# Patient Record
Sex: Female | Born: 1945 | Race: Black or African American | Hispanic: No | Marital: Single | Smoking: Former smoker
Health system: Southern US, Community
[De-identification: ages and names within clinical notes are randomized; demographics above are authoritative.]

## PROBLEM LIST (undated history)

## (undated) DIAGNOSIS — K219 Gastro-esophageal reflux disease without esophagitis: Secondary | ICD-10-CM

## (undated) DIAGNOSIS — R0789 Other chest pain: Secondary | ICD-10-CM

## (undated) DIAGNOSIS — I2510 Atherosclerotic heart disease of native coronary artery without angina pectoris: Secondary | ICD-10-CM

## (undated) DIAGNOSIS — T7840XA Allergy, unspecified, initial encounter: Secondary | ICD-10-CM

## (undated) DIAGNOSIS — R739 Hyperglycemia, unspecified: Secondary | ICD-10-CM

## (undated) DIAGNOSIS — M1990 Unspecified osteoarthritis, unspecified site: Secondary | ICD-10-CM

## (undated) DIAGNOSIS — F419 Anxiety disorder, unspecified: Secondary | ICD-10-CM

## (undated) DIAGNOSIS — I5189 Other ill-defined heart diseases: Secondary | ICD-10-CM

## (undated) DIAGNOSIS — J449 Chronic obstructive pulmonary disease, unspecified: Secondary | ICD-10-CM

## (undated) DIAGNOSIS — E785 Hyperlipidemia, unspecified: Secondary | ICD-10-CM

## (undated) DIAGNOSIS — I10 Essential (primary) hypertension: Secondary | ICD-10-CM

## (undated) DIAGNOSIS — H409 Unspecified glaucoma: Secondary | ICD-10-CM

## (undated) DIAGNOSIS — H269 Unspecified cataract: Secondary | ICD-10-CM

## (undated) DIAGNOSIS — J45909 Unspecified asthma, uncomplicated: Secondary | ICD-10-CM

## (undated) HISTORY — DX: Chronic obstructive pulmonary disease, unspecified: J44.9

## (undated) HISTORY — DX: Other ill-defined heart diseases: I51.89

## (undated) HISTORY — DX: Hyperlipidemia, unspecified: E78.5

## (undated) HISTORY — DX: Allergy, unspecified, initial encounter: T78.40XA

## (undated) HISTORY — DX: Hyperglycemia, unspecified: R73.9

## (undated) HISTORY — DX: Gastro-esophageal reflux disease without esophagitis: K21.9

## (undated) HISTORY — DX: Other chest pain: R07.89

## (undated) HISTORY — DX: Atherosclerotic heart disease of native coronary artery without angina pectoris: I25.10

## (undated) HISTORY — DX: Unspecified asthma, uncomplicated: J45.909

## (undated) HISTORY — DX: Unspecified glaucoma: H40.9

## (undated) HISTORY — PX: TONSILLECTOMY: SUR1361

## (undated) HISTORY — DX: Essential (primary) hypertension: I10

## (undated) HISTORY — DX: Unspecified cataract: H26.9

## (undated) HISTORY — DX: Unspecified osteoarthritis, unspecified site: M19.90

## (undated) HISTORY — DX: Anxiety disorder, unspecified: F41.9

---

## 2005-03-29 ENCOUNTER — Emergency Department: Payer: Self-pay | Admitting: Emergency Medicine

## 2006-07-27 ENCOUNTER — Emergency Department (HOSPITAL_COMMUNITY): Admission: EM | Admit: 2006-07-27 | Discharge: 2006-07-27 | Payer: Self-pay | Admitting: Emergency Medicine

## 2006-11-13 ENCOUNTER — Emergency Department: Payer: Self-pay | Admitting: Internal Medicine

## 2007-02-25 ENCOUNTER — Inpatient Hospital Stay (HOSPITAL_BASED_OUTPATIENT_CLINIC_OR_DEPARTMENT_OTHER): Admission: RE | Admit: 2007-02-25 | Discharge: 2007-02-25 | Payer: Self-pay | Admitting: Cardiology

## 2007-07-30 ENCOUNTER — Emergency Department (HOSPITAL_COMMUNITY): Admission: EM | Admit: 2007-07-30 | Discharge: 2007-07-30 | Payer: Self-pay | Admitting: Emergency Medicine

## 2008-04-23 ENCOUNTER — Emergency Department (HOSPITAL_COMMUNITY): Admission: EM | Admit: 2008-04-23 | Discharge: 2008-04-24 | Payer: Self-pay | Admitting: Emergency Medicine

## 2008-06-09 ENCOUNTER — Other Ambulatory Visit: Payer: Self-pay

## 2008-06-09 ENCOUNTER — Inpatient Hospital Stay: Payer: Self-pay | Admitting: Internal Medicine

## 2009-05-30 ENCOUNTER — Emergency Department: Payer: Self-pay | Admitting: Emergency Medicine

## 2009-07-15 ENCOUNTER — Ambulatory Visit: Payer: Self-pay | Admitting: Cardiology

## 2009-07-15 ENCOUNTER — Telehealth: Payer: Self-pay | Admitting: Cardiology

## 2009-07-15 DIAGNOSIS — R0602 Shortness of breath: Secondary | ICD-10-CM | POA: Insufficient documentation

## 2009-07-15 DIAGNOSIS — I10 Essential (primary) hypertension: Secondary | ICD-10-CM

## 2009-07-15 DIAGNOSIS — Z87891 Personal history of nicotine dependence: Secondary | ICD-10-CM | POA: Insufficient documentation

## 2009-07-15 DIAGNOSIS — E785 Hyperlipidemia, unspecified: Secondary | ICD-10-CM | POA: Insufficient documentation

## 2009-07-18 ENCOUNTER — Encounter: Payer: Self-pay | Admitting: Cardiology

## 2009-07-18 LAB — CONVERTED CEMR LAB
ALT: 24 units/L (ref 0–35)
BUN: 8 mg/dL (ref 6–23)
CO2: 26 meq/L (ref 19–32)
Cholesterol: 231 mg/dL — ABNORMAL HIGH (ref 0–200)
Glucose, Bld: 87 mg/dL (ref 70–99)
HDL: 39 mg/dL — ABNORMAL LOW (ref >39)
Sodium: 142 meq/L (ref 135–145)
Total CHOL/HDL Ratio: 5.9
Triglycerides: 175 mg/dL — ABNORMAL HIGH (ref <150)
VLDL: 35 mg/dL (ref 0–40)

## 2009-07-25 ENCOUNTER — Ambulatory Visit: Payer: Self-pay | Admitting: Internal Medicine

## 2009-07-25 ENCOUNTER — Encounter: Payer: Self-pay | Admitting: Cardiology

## 2009-07-26 LAB — CONVERTED CEMR LAB
BUN: 23 mg/dL (ref 6–23)
Calcium: 9.2 mg/dL (ref 8.4–10.5)
Chloride: 100 meq/L (ref 96–112)
Potassium: 3.9 meq/L (ref 3.5–5.3)
Sodium: 141 meq/L (ref 135–145)

## 2009-07-28 ENCOUNTER — Telehealth (INDEPENDENT_AMBULATORY_CARE_PROVIDER_SITE_OTHER): Payer: Self-pay | Admitting: *Deleted

## 2009-08-01 ENCOUNTER — Ambulatory Visit: Payer: Self-pay

## 2009-08-01 ENCOUNTER — Ambulatory Visit: Payer: Self-pay | Admitting: Internal Medicine

## 2009-08-01 ENCOUNTER — Encounter: Payer: Self-pay | Admitting: Cardiology

## 2009-08-01 ENCOUNTER — Ambulatory Visit (HOSPITAL_COMMUNITY): Admission: RE | Admit: 2009-08-01 | Discharge: 2009-08-01 | Payer: Self-pay | Admitting: Cardiology

## 2009-08-01 ENCOUNTER — Encounter (HOSPITAL_COMMUNITY): Admission: RE | Admit: 2009-08-01 | Discharge: 2009-09-14 | Payer: Self-pay | Admitting: Cardiology

## 2009-08-04 ENCOUNTER — Ambulatory Visit: Payer: Self-pay | Admitting: Cardiology

## 2009-08-23 ENCOUNTER — Telehealth: Payer: Self-pay | Admitting: Cardiology

## 2009-08-26 ENCOUNTER — Encounter: Payer: Self-pay | Admitting: Cardiology

## 2009-09-23 ENCOUNTER — Encounter: Payer: Self-pay | Admitting: Cardiology

## 2009-10-07 ENCOUNTER — Encounter: Payer: Self-pay | Admitting: Cardiology

## 2009-10-07 ENCOUNTER — Ambulatory Visit: Payer: Self-pay | Admitting: Cardiovascular Disease

## 2009-10-11 ENCOUNTER — Telehealth: Payer: Self-pay | Admitting: Cardiology

## 2009-10-11 LAB — CONVERTED CEMR LAB
ALT: 26 units/L (ref 0–35)
AST: 16 units/L (ref 0–37)
Alkaline Phosphatase: 84 units/L (ref 39–117)
Bilirubin, Direct: 0.1 mg/dL (ref 0.0–0.3)
Cholesterol: 135 mg/dL (ref 0–200)
Total Bilirubin: 0.4 mg/dL (ref 0.3–1.2)

## 2009-11-11 ENCOUNTER — Ambulatory Visit: Payer: Self-pay | Admitting: Cardiovascular Disease

## 2009-11-15 ENCOUNTER — Encounter: Payer: Self-pay | Admitting: Cardiovascular Disease

## 2009-11-15 ENCOUNTER — Ambulatory Visit: Payer: Self-pay | Admitting: Cardiovascular Disease

## 2010-01-31 ENCOUNTER — Ambulatory Visit: Payer: Self-pay | Admitting: Cardiovascular Disease

## 2010-03-22 ENCOUNTER — Encounter: Payer: Self-pay | Admitting: Cardiovascular Disease

## 2010-03-22 ENCOUNTER — Telehealth: Payer: Self-pay | Admitting: Cardiovascular Disease

## 2010-04-05 ENCOUNTER — Ambulatory Visit: Payer: Self-pay | Admitting: Cardiovascular Disease

## 2010-04-12 ENCOUNTER — Encounter: Payer: Self-pay | Admitting: Cardiovascular Disease

## 2010-04-13 ENCOUNTER — Telehealth: Payer: Self-pay | Admitting: Cardiovascular Disease

## 2010-04-13 ENCOUNTER — Encounter: Payer: Self-pay | Admitting: Cardiovascular Disease

## 2010-04-22 ENCOUNTER — Emergency Department: Payer: Self-pay | Admitting: Internal Medicine

## 2010-07-31 ENCOUNTER — Telehealth: Payer: Self-pay | Admitting: Cardiology

## 2010-08-19 ENCOUNTER — Emergency Department (HOSPITAL_COMMUNITY)
Admission: EM | Admit: 2010-08-19 | Discharge: 2010-08-19 | Payer: Self-pay | Source: Home / Self Care | Admitting: Emergency Medicine

## 2010-10-17 NOTE — Progress Notes (Signed)
Summary: RX  Phone Note Refill Request Call back at Home Phone [phone] Message from:  Patient on July 31, 2010 10:41 AM  Refills Requested: Medication #1:  NEXIUM 40 MG CPDR 1 by mouth once daily [facility]  Initial call taken by: Harlon Flor,  July 31, 2010 10:41 AM Caller: Patient    Prescriptions: NEXIUM 40 MG CPDR (ESOMEPRAZOLE MAGNESIUM) 1 by mouth once daily  #90 x 2   Entered by:   Hardin Negus, RMA   Authorized by:   Dossie Arbour MD   Signed by:   Hardin Negus, RMA on 07/31/2010   Method used:   Electronically to        [facility]* (mail-order)       [address]       [place], [place]  [address]       Ph: [phone]       Fax: [phone]   RxID:   [id number]   Appended Document: RX    Prescriptions: NEXIUM 40 MG CPDR (ESOMEPRAZOLE MAGNESIUM) 1 by mouth once daily  #90 x 2   Entered by:   Bishop Dublin, CMA   Authorized by:   Dossie Arbour MD   Signed by:   Bishop Dublin, CMA on 08/03/2010   Method used:   Electronically to        [facility]* (mail-order)       [address]       [place], [place]  [address]       Ph: [phone]       Fax: [phone]   RxID:   [id number]

## 2010-10-17 NOTE — Progress Notes (Signed)
Summary: Refill Zocor   Phone Note Refill Request   Refills Requested: Medication #1:  ZOCOR 40 MG TABS 1 tab by mouth daily   Dosage confirmed as above?Dosage Confirmed   Supply Requested: 3 months [facility]* (mail-order)       [address]       [place], [place]  [address]       Ph: [phone]       Fax: [phone]  Initial call taken by: West Carbo,  March 22, 2010 9:22 AM    Prescriptions: ZOCOR 40 MG TABS (SIMVASTATIN) 1 tab by mouth daily  #90 x 3   Entered by:   Bishop Dublin, CMA   Authorized by:   Dossie Arbour MD   Signed by:   Bishop Dublin, CMA on 03/22/2010   Method used:   Electronically to        [facility]* (mail-order)       [address]       [place], [place]  [address]       Ph: [phone]       Fax: [phone]   RxID:   [id number]

## 2010-10-17 NOTE — Medication Information (Signed)
Summary: Tax adviser   Imported By: West Carbo 04/13/2010 15:25:42  _____________________________________________________________________  External Attachment:    Type:   Image     Comment:   External Document

## 2010-10-17 NOTE — Progress Notes (Signed)
Summary: RX  Phone Note Call from Patient Call back at Home Phone [phone]   Caller: SELF Call For: [facility] Summary of Call: NEED [facility] [address]  Initial call taken by: Harlon Flor,  October 11, 2009 10:12 AM

## 2010-10-17 NOTE — Medication Information (Signed)
Summary: Tax adviser   Imported By: Frazier Butt Chriscoe 04/12/2010 10:29:26  _____________________________________________________________________  External Attachment:    Type:   Image     Comment:   External Document

## 2010-10-17 NOTE — Progress Notes (Signed)
Summary: REFILL  Phone Note Call from Patient Call back at Home Phone [phone]   Caller: Patient Call For: [facility] Summary of Call: NEEDS TO CLARIFY WITH [facility] FOR ADVAIR. PATIENT SAYS THEY DENIED PRESCRIPTION Initial call taken by: West Carbo,  April 13, 2010 10:21 AM  Additional Follow-up for Phone Call Additional follow up Details #1::        The Rx was faxed again with correct information per [facility]. Additional Follow-up by: Bishop Dublin, CMA,  April 13, 2010 12:20 PM

## 2010-10-17 NOTE — Medication Information (Signed)
Summary: Tax adviser   Imported By: Harlon Flor 09/23/2009 10:29:00  _____________________________________________________________________  External Attachment:    Type:   Image     Comment:   External Document

## 2010-10-17 NOTE — Assessment & Plan Note (Signed)
Summary: ekg  Nurse Visit   Allergies: 1)  ! * Pcn  Orders Added: 1)  EKG w/ Interpretation [93000]

## 2010-10-17 NOTE — Medication Information (Signed)
Summary: Tax adviser   Imported By: West Carbo 03/22/2010 10:11:24  _____________________________________________________________________  External Attachment:    Type:   Image     Comment:   External Document

## 2010-10-17 NOTE — Assessment & Plan Note (Signed)
Summary: ROV   Visit Type:  Follow-up Referring Provider:  mclean Primary Provider:  None  CC:  c/o shortness of breath and abdominal tightness.Marland Kitchen  History of Present Illness: 65 yo with history of HTN, hyperlipidemia, and prior cath with only mild coronary disease in 2008 presents for follow up.   Pulmonary function test showed moderate COPD with bronchodilator response. She continues to have some shortness of breath and wheezing. She takes Spiriva and albuterol as needed. she stopped smoking many years ago.  otherwise she feels well. Is active, no significant chest pain.  patient had an ETT-myoview which showed poor exercise tolerance but no evidence for ischemia or infarction.  Echo was done showing normal systolic function and normal valves.   Labs (10/10): LDL 157, HDL 39, TG 175, K 3.9, creatinine 1.06  Current Medications (verified): 1)  Nexium 40 Mg Cpdr (Esomeprazole Magnesium) .Marland Kitchen.. 1 By Mouth Once Daily 2)  Aspirin 81 Mg Tbec (Aspirin) .... Take One Tablet By Mouth Daily 3)  Lisinopril-Hydrochlorothiazide 20-12.5 Mg Tabs (Lisinopril-Hydrochlorothiazide) .Marland Kitchen.. 1 Tablet By Mouth Daily 4)  Zocor 40 Mg Tabs (Simvastatin) .Marland Kitchen.. 1 Tab By Mouth Daily 5)  Fish Oil 1000 Mg Caps (Omega-3 Fatty Acids) .... 2 Tab By Mouth Daily 6)  Proair Hfa 108 (90 Base) Mcg/act Aers (Albuterol Sulfate) .Marland Kitchen.. 1-2 Puffs Every 4-6 Hours As Needed 7)  Spiriva Handihaler 18 Mcg Caps (Tiotropium Bromide Monohydrate) .... As Directed  Allergies (verified): 1)  ! * Pcn  Past History:  Past Medical History: Last updated: 08/04/2009 1. Anxiety 2. Asthma 3. G E R D 4. Hyperlipidemia 5. Hypertension 6. Chest pain: LHC (6/08): EF 55-60%, 20-30% proximal LAD, LIs CFX, LIs RCA (Harwani). ETT-myoview (11/10): 3', stopped due to fatigue and chest tightness, EF normal, probably normal perfusion images with minimal soft tissue attenuation.  7. Prior smoker 8. Dyspnea on exertion: Echo (11/10) showed EF 60-65%,  valves normal.   Past Surgical History: Last updated: 2009-07-27 Tonsillectomy  Family History: Last updated: 07-27-2009 Mother: deceased 72: cancer of uterus Father: deceased 35: MI Brother: deceased 24: pulmonary embolus  Social History: Last updated: 07-27-2009 Retired  Divorced, lives alone, no children.   Tobacco Use - Former, quit 1980s.  Alcohol Use - no Regular Exercise - no Drug Use - no  Risk Factors: Alcohol Use: 0 (July 27, 2009) Exercise: no (Jul 27, 2009)  Risk Factors: Smoking Status: quit (July 27, 2009) Packs/Day: .25 daily (July 27, 2009)  Review of Systems       The patient complains of dyspnea on exertion.  The patient denies fever, weight loss, weight gain, vision loss, decreased hearing, hoarseness, chest pain, syncope, peripheral edema, prolonged cough, abdominal pain, incontinence, muscle weakness, depression, and enlarged lymph nodes.    Vital Signs:  Patient profile:   65 year old female Height:      65 inches Weight:      222 pounds Pulse rate:   82 / minute BP sitting:   116 / 76  (left arm) Cuff size:   large  Vitals Entered By: Bishop Dublin, CMA (April 05, 2010 10:29 AM)  Physical Exam  General:  Well developed, well nourished, in no acute distress. Head:  normocephalic and atraumatic Neck:  Neck supple, no JVD. No masses, thyromegaly or abnormal cervical nodes. Lungs:  Clear bilaterally to auscultation and percussion. Heart:  Non-displaced PMI, chest non-tender; regular rate and rhythm, S1, S2 without murmurs, rubs or gallops. Carotid upstroke normal, no bruit. Pedals normal pulses. No edema, no varicosities. Abdomen:  Bowel sounds positive; abdomen  soft and non-tender without masses Msk:  Back normal, normal gait. Muscle strength and tone normal. Pulses:  pulses normal in all 4 extremities Extremities:  No clubbing or cyanosis. Neurologic:  Alert and oriented x 3. Skin:  Intact without lesions or rashes. Psych:  Normal  affect.   Impression & Recommendations:  Problem # 1:  SHORTNESS OF BREATH (ICD-786.05) she has underlying COPD based on pulmonary function test with bronchodilator response. She's only taking Spiriva on a consistent basis. We will start her on Advair 2 puffs b.i.d. and albuterol p.r.n. She can follow up with her primary care physician for adjustment of the inhalers.  Her updated medication list for this problem includes:    Aspirin 81 Mg Tbec (Aspirin) .Marland Kitchen... Take one tablet by mouth daily    Lisinopril-hydrochlorothiazide 20-12.5 Mg Tabs (Lisinopril-hydrochlorothiazide) .Marland Kitchen... 1 tablet by mouth daily  Problem # 2:  HYPERTENSION, UNSPECIFIED (ICD-401.9) Blood pressure is reasonably well controlled. We have made no further medication changes.  Her updated medication list for this problem includes:    Aspirin 81 Mg Tbec (Aspirin) .Marland Kitchen... Take one tablet by mouth daily    Lisinopril-hydrochlorothiazide 20-12.5 Mg Tabs (Lisinopril-hydrochlorothiazide) .Marland Kitchen... 1 tablet by mouth daily  Problem # 3:  HYPERLIPIDEMIA-MIXED (ICD-272.4) She has excellent lipid control on Zocor and we've made no changes.  Her updated medication list for this problem includes:    Zocor 40 Mg Tabs (Simvastatin) .Marland Kitchen... 1 tab by mouth daily  Patient Instructions: 1)  Your physician has recommended you make the following change in your medication: START advair 2)  Your physician wants you to follow-up in:   1 year You will receive a reminder letter in the mail two months in advance. If you don't receive a letter, please call our office to schedule the follow-up appointment. Prescriptions: ADVAIR DISKUS 250-50 MCG/DOSE AEPB (FLUTICASONE-SALMETEROL) 2 puffs two times a day  #3 x 4   Entered by:   Benedict Needy, RN   Authorized by:   Dossie Arbour MD   Signed by:   Benedict Needy, RN on 04/05/2010   Method used:   Electronically to        [facility] ([facility]order)       [address]        [place], [place]  [address]       Ph: [phone]       Fax: [phone]   RxID:   [id number]

## 2010-10-17 NOTE — Assessment & Plan Note (Signed)
Summary: EC6/AMD   Visit Type:  Follow-up Referring Provider:  mclean  CC:  some chest tightness.  History of Present Illness: 65 yo with history of HTN, hyperlipidemia, and prior cath with only mild coronary disease in 2008 presents for follow up of chest pain and shortness of breath.   last seen November 2010 by Dr. Shirlee Latch for shortness of breath and chest discomfort. She continues to have symptoms of these are mild and typically not associated with exertion. She has continued problems with her breathing and takes inhalers.  patient had an ETT-myoview which showed poor exercise tolerance but no evidence for ischemia or infarction.  Echo was done showing normal systolic function and normal valves.   Labs (10/10): LDL 157, HDL 39, TG 175, K 3.9, creatinine 1.06  Current Problems (verified): 1)  Hypertension, Unspecified  (ICD-401.9) 2)  Hyperlipidemia-mixed  (ICD-272.4) 3)  Chest Pain  (ICD-786.50) 4)  Shortness of Breath  (ICD-786.05) 5)  Tobacco Abuse, Hx of  (ICD-V15.82)  Current Medications (verified): 1)  Nexium 40 Mg Cpdr (Esomeprazole Magnesium) .Marland Kitchen.. 1 By Mouth Once Daily 2)  Aspirin 81 Mg Tbec (Aspirin) .... Take One Tablet By Mouth Daily 3)  Lisinopril-Hydrochlorothiazide 20-12.5 Mg Tabs (Lisinopril-Hydrochlorothiazide) .Marland Kitchen.. 1 Tablet By Mouth Daily 4)  Zocor 40 Mg Tabs (Simvastatin) .Marland Kitchen.. 1 Tab By Mouth Daily 5)  Fish Oil 1000 Mg Caps (Omega-3 Fatty Acids) .... 2 Tab By Mouth Daily 6)  Proair Hfa 108 (90 Base) Mcg/act Aers (Albuterol Sulfate) .Marland Kitchen.. 1-2 Puffs Every 4-6 Hours As Needed 7)  Albuterol Sulfate (2.5 Mg/32ml) 0.083% Nebu (Albuterol Sulfate) .Marland Kitchen.. 1 Treatment Every 4-6 Hours As Needed 8)  Advair Diskus 250-50 Mcg/dose Aepb (Fluticasone-Salmeterol) .... As Directed  Allergies (verified): 1)  ! * Pcn  Past History:  Past Surgical History: Last updated: 07/15/2009 Tonsillectomy  Review of Systems       The patient complains of chest pain and dyspnea on  exertion.  The patient denies anorexia, fever, weight loss, weight gain, vision loss, decreased hearing, hoarseness, syncope, peripheral edema, prolonged cough, headaches, hemoptysis, abdominal pain, melena, hematochezia, severe indigestion/heartburn, hematuria, incontinence, genital sores, muscle weakness, suspicious skin lesions, transient blindness, difficulty walking, depression, unusual weight change, abnormal bleeding, enlarged lymph nodes, and breast masses.    Vital Signs:  Patient profile:   65 year old female Height:      65 inches Weight:      228 pounds BMI:     38.08 Pulse rate:   80 / minute BP sitting:   108 / 70  (left arm) Cuff size:   regular  Vitals Entered By: Hardin Negus, RMA (November 11, 2009 11:39 AM)  Physical Exam  General:  middle-aged woman in no apparent distress, HEENT exam is benign, oropharynx is clear, neck is supple with no JVP or carotid bruits, heart sounds were regular with normal S1-S2 and no murmurs appreciated, lungs have decreased breath sounds lightly throughout otherwise clear, abdominal exam notable for mild to moderate obesity otherwise benign, no significant lower extremity edema, neurologic exam is grossly nonfocal, skin is warm and dry.   EKG  Procedure date:  11/11/2009  Findings:      Normal sinus rhythm with rate of 80 beats a minute, no significant ST or T wave changes, low voltage in the limb leads.  Impression & Recommendations:  Problem # 1:  SHORTNESS OF BREATH (ICD-786.05) etiology of her shortness of breath is likely due to underlying pulmonary issue and asthma or due to general deconditioning. She  did not complete her pulmonary function tests as ordered by Dr. Shirlee Latch and we have ordered this at [facility]. If this does not show any obstructive lung disease, I believe her shortness of breath may be due to her deconditioned state.  Her updated medication list for this problem includes:    Aspirin 81 Mg Tbec (Aspirin)  .Marland Kitchen... Take one tablet by mouth daily    Lisinopril-hydrochlorothiazide 20-12.5 Mg Tabs (Lisinopril-hydrochlorothiazide) .Marland Kitchen... 1 tablet by mouth daily  Her updated medication list for this problem includes:    Aspirin 81 Mg Tbec (Aspirin) .Marland Kitchen... Take one tablet by mouth daily    Lisinopril-hydrochlorothiazide 20-12.5 Mg Tabs (Lisinopril-hydrochlorothiazide) .Marland Kitchen... 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 cholesterol is at goal when measured back in October 2010. Her updated medication list for this problem includes:    Zocor 40 Mg Tabs (Simvastatin) .Marland Kitchen... 1 tab by mouth daily  Problem # 3:  CHEST PAIN (ICD-786.50) chest pain is very atypical in nature. She had a negative stress test recently. I suspect this is to treat her underlying shortness of breath and asthma.  Her updated medication list for this problem includes:    Aspirin 81 Mg Tbec (Aspirin) .Marland Kitchen... Take one tablet by mouth daily    Lisinopril-hydrochlorothiazide 20-12.5 Mg Tabs (Lisinopril-hydrochlorothiazide) .Marland Kitchen... 1 tablet by mouth daily  Orders: EKG w/ Interpretation (93000)  New Orders:     1)  EKG w/ Interpretation (93000)     2)  Full Pulmonary Function Test (PFT)   Patient Instructions: 1)  Your physician recommends that you schedule a follow-up appointment in: as needed 2)  Your physician has recommended that you have a pulmonary function test.  Pulmonary Function Tests are a group of tests that measure how well air moves in and out of your lungs. 3)  11/15/09 @ 10am 4)  lite breakfast 5)  no inhalers Prescriptions: NEXIUM 40 MG CPDR (ESOMEPRAZOLE MAGNESIUM) 1 by mouth once daily  #30 x 6   Entered by:   Mercer Pod   Authorized by:   Dossie Arbour MD   Signed by:   Mercer Pod on 11/11/2009   Method used:   [id number]       [address]       [place], [place]  [address]       Ph: [phone] or [phone]       Fax: [phone]   RxID:   [id number]

## 2010-11-08 ENCOUNTER — Encounter: Payer: Self-pay | Admitting: Cardiovascular Disease

## 2010-11-08 ENCOUNTER — Ambulatory Visit (INDEPENDENT_AMBULATORY_CARE_PROVIDER_SITE_OTHER): Payer: Medicare Other | Admitting: Cardiovascular Disease

## 2010-11-08 DIAGNOSIS — I10 Essential (primary) hypertension: Secondary | ICD-10-CM

## 2010-11-08 DIAGNOSIS — E785 Hyperlipidemia, unspecified: Secondary | ICD-10-CM

## 2010-11-08 DIAGNOSIS — R079 Chest pain, unspecified: Secondary | ICD-10-CM

## 2010-11-08 DIAGNOSIS — J449 Chronic obstructive pulmonary disease, unspecified: Secondary | ICD-10-CM

## 2010-11-14 NOTE — Letter (Signed)
Summary: Medicaid Transportation Verification  Medicaid Transportation Verification   Imported By: Harlon Flor 11/10/2010 14:01:11  _____________________________________________________________________  External Attachment:    Type:   Image     Comment:   External Document

## 2010-11-14 NOTE — Assessment & Plan Note (Signed)
Summary: ROV/AMD   Visit Type:  Follow-up Referring Provider:  mclean Primary Provider:  Dr. Dario Guardian  CC:  c/o chest pains and occasional SOB when walking and .  History of Present Illness: 65 yo with history of HTN, hyperlipidemia, and prior cath with only mild coronary disease in 2008 presents for follow up.   Pulmonary function test showed moderate COPD with bronchodilator response. She takes Spiriva and albuterol as needed. she stopped smoking many years ago.  she has had recent sinus infection, possible viral URI. She has been taking care of her grandson and reports having significant back pain possibly from lifting him. Also has sharp stabbing pain typically at rest radiating around under her left breast. Some shortness of breath with exertion though uncertain if this is from her sinuses. No significant sputum production, mild cough. Symptoms have been going on for one to 2 weeks  patient had an ETT-myoview which showed poor exercise tolerance but no evidence for ischemia or infarction.  Echo was done showing normal systolic function and normal valves.   Labs (10/10): LDL 157, HDL 39, TG 175, K 3.9, creatinine 1.06  EKG shows normal sinus rhythm with rate 76 beats per minute, no significant ST or T wave changes  Current Medications (verified): 1)  Nexium 40 Mg Cpdr (Esomeprazole Magnesium) .Marland Kitchen.. 1 By Mouth Once Daily 2)  Aspirin 81 Mg Tbec (Aspirin) .... Take One Tablet By Mouth Daily 3)  Zocor 40 Mg Tabs (Simvastatin) .Marland Kitchen.. 1 Tab By Mouth Daily 4)  Fish Oil 1000 Mg Caps (Omega-3 Fatty Acids) .... 2 Tab By Mouth Daily 5)  Proair Hfa 108 (90 Base) Mcg/act Aers (Albuterol Sulfate) .Marland Kitchen.. 1-2 Puffs Every 4-6 Hours As Needed 6)  Spiriva Handihaler 18 Mcg Caps (Tiotropium Bromide Monohydrate) .... As Directed 7)  Advair Diskus 250-50 Mcg/dose Aepb (Fluticasone-Salmeterol) .... 2 Puffs Two Times A Day 8)  Micardis 40 Mg Tabs (Telmisartan) .... Take One Tablet By Mouth Daily 9)  Calcium 500  Mg Tabs (Calcium) .Marland Kitchen.. 1 Tablet Once Daily  Allergies (verified): 1)  ! * Pcn  Past History:  Past Medical History: Last updated: 08/04/2009 1. Anxiety 2. Asthma 3. G E R D 4. Hyperlipidemia 5. Hypertension 6. Chest pain: LHC (6/08): EF 55-60%, 20-30% proximal LAD, LIs CFX, LIs RCA (Harwani). ETT-myoview (11/10): 3', stopped due to fatigue and chest tightness, EF normal, probably normal perfusion images with minimal soft tissue attenuation.  7. Prior smoker 8. Dyspnea on exertion: Echo (11/10) showed EF 60-65%, valves normal.   Past Surgical History: Last updated: 07-18-2009 Tonsillectomy  Family History: Last updated: 07/18/09 Mother: deceased 103: cancer of uterus Father: deceased 71: MI Brother: deceased 77: pulmonary embolus  Social History: Last updated: Jul 18, 2009 Retired  Divorced, lives alone, no children.   Tobacco Use - Former, quit 1980s.  Alcohol Use - no Regular Exercise - no Drug Use - no  Risk Factors: Alcohol Use: 0 (Jul 18, 2009) Exercise: no (07/18/09)  Risk Factors: Smoking Status: quit (18-Jul-2009) Packs/Day: .25 daily (07-18-09)  Review of Systems       The patient complains of chest pain and dyspnea on exertion.  The patient denies fever, weight loss, weight gain, vision loss, decreased hearing, hoarseness, syncope, peripheral edema, prolonged cough, abdominal pain, incontinence, muscle weakness, depression, and enlarged lymph nodes.         back pain  Vital Signs:  Patient profile:   65 year old female Height:      65 inches Weight:  236.50 pounds BMI:     39.50 Pulse rate:   76 / minute BP sitting:   136 / 95  (left arm) Cuff size:   large  Vitals Entered By: Lysbeth Galas CMA (November 08, 2010 11:32 AM)  Physical Exam  General:  Well developed, well nourished, in no acute distress. Head:  normocephalic and atraumatic Neck:  Neck supple, no JVD. No masses, thyromegaly or abnormal cervical nodes. Lungs:  scattered rales  bilaterally, decreased breath sounds mildly throughout Heart:  Non-displaced PMI, chest non-tender; regular rate and rhythm, S1, S2 without murmurs, rubs or gallops. Carotid upstroke normal, no bruit. Pedals normal pulses. No edema, no varicosities. Abdomen:  Bowel sounds positive; abdomen soft and non-tender without masses Msk:  Back normal, normal gait. Muscle strength and tone normal. Pulses:  pulses normal in all 4 extremities Extremities:  No clubbing or cyanosis. Neurologic:  Alert and oriented x 3. Skin:  Intact without lesions or rashes. Psych:  Normal affect.   Impression & Recommendations:  Problem # 1:  SHORTNESS OF BREATH (ICD-786.05) etiology of her shortness of breath is likely secondary to underlying moderate COPD, possible with underlying sinus infection for the past one to 2 weeks. She takes Combivent inhaler which seemed to help her symptoms. She feels she might be slowly improving though the sinus is still significant.  The following medications were removed from the medication list:    Lisinopril-hydrochlorothiazide 20-12.5 Mg Tabs (Lisinopril-hydrochlorothiazide) .Marland Kitchen... 1 tablet by mouth daily Her updated medication list for this problem includes:    Aspirin 81 Mg Tbec (Aspirin) .Marland Kitchen... Take one tablet by mouth daily    Micardis 40 Mg Tabs (Telmisartan) .Marland Kitchen... Take one tablet by mouth daily  Problem # 2:  HYPERTENSION, UNSPECIFIED (ICD-401.9) Blood pressure is borderline elevated on her current medication regimen. We have made no changes. Elevated numbers could be secondary to underlying sinus infection.  The following medications were removed from the medication list:    Lisinopril-hydrochlorothiazide 20-12.5 Mg Tabs (Lisinopril-hydrochlorothiazide) .Marland Kitchen... 1 tablet by mouth daily Her updated medication list for this problem includes:    Aspirin 81 Mg Tbec (Aspirin) .Marland Kitchen... Take one tablet by mouth daily    Micardis 40 Mg Tabs (Telmisartan) .Marland Kitchen... Take one tablet by mouth  daily  Problem # 3:  CHEST PAIN (ICD-786.50) Cause of her chest pain is uncertain though concerning for musculoskeletal disease. It is sharp, very brief, not typically associated with exertion. We have suggested we give it more time, she tried nonsteroidal anti-inflammatories. If she continues to have chest pain after she has improved her URI and sinus infection, we could proceed with workup. Stress test in 2010 showed no ischemia though she did not treadmill very well. Additional testing would require pharmacological study.  The following medications were removed from the medication list:    Lisinopril-hydrochlorothiazide 20-12.5 Mg Tabs (Lisinopril-hydrochlorothiazide) .Marland Kitchen... 1 tablet by mouth daily Her updated medication list for this problem includes:    Aspirin 81 Mg Tbec (Aspirin) .Marland Kitchen... Take one tablet by mouth daily  Orders: EKG w/ Interpretation (93000)  Problem # 4:  HYPERLIPIDEMIA-MIXED (ICD-272.4) Continue her on Zocor. Goal LDL is less than 70.  Her updated medication list for this problem includes:    Zocor 40 Mg Tabs (Simvastatin) .Marland Kitchen... 1 tab by mouth daily

## 2010-11-28 LAB — DIFFERENTIAL
Basophils Relative: 1 % (ref 0–1)
Eosinophils Absolute: 0.1 10*3/uL (ref 0.0–0.7)
Monocytes Absolute: 0.3 10*3/uL (ref 0.1–1.0)
Neutrophils Relative %: 64 % (ref 43–77)

## 2010-11-28 LAB — POCT I-STAT, CHEM 8
BUN: 9 mg/dL (ref 6–23)
Calcium, Ion: 1.07 mmol/L — ABNORMAL LOW (ref 1.12–1.32)
Chloride: 106 mEq/L (ref 96–112)
Glucose, Bld: 128 mg/dL — ABNORMAL HIGH (ref 70–99)
Hemoglobin: 15.3 g/dL — ABNORMAL HIGH (ref 12.0–15.0)
TCO2: 23 mmol/L (ref 0–100)

## 2010-11-28 LAB — CBC
MCH: 28.5 pg (ref 26.0–34.0)
MCHC: 33.3 g/dL (ref 30.0–36.0)
MCV: 85.5 fL (ref 78.0–100.0)
Platelets: 205 10*3/uL (ref 150–400)
RDW: 13.8 % (ref 11.5–15.5)
WBC: 6.5 10*3/uL (ref 4.0–10.5)

## 2010-12-04 ENCOUNTER — Telehealth: Payer: Self-pay | Admitting: Cardiovascular Disease

## 2010-12-14 NOTE — Progress Notes (Signed)
Summary: RX  Phone Note Refill Request Call back at Home Phone [phone] Message from:  Patient on December 04, 2010 11:31 AM  Refills Requested: Medication #1:  SIMVASTATIN [facility] FAX #[phone]  Initial call taken by: Harlon Flor,  December 04, 2010 11:32 AM    Prescriptions: ZOCOR 40 MG TABS (SIMVASTATIN) 1 tab by mouth daily  #90 x 3   Entered by:   Lysbeth Galas CMA   Authorized by:   Dossie Arbour MD   Signed by:   Lysbeth Galas CMA on 12/04/2010   Method used:   Electronically to        [facility] (mail-order)       [address] [place]  [address]       Ph: [phone]       Fax: [phone]   RxID:   [id number]

## 2011-01-30 NOTE — Cardiovascular Report (Signed)
NAMEDOROTHE, ELMORE            ACCOUNT NO.:  [id number]   MEDICAL RECORD NO.:  [id number]          PATIENT TYPE:  OIB   LOCATION:  NA                           FACILITY:  [facility]   PHYSICIAN:  Mohan N. Sharyn Lull, M.D. DATE OF BIRTH:  [birth year]   DATE OF PROCEDURE:  02/25/2007  DATE OF DISCHARGE:                            CARDIAC CATHETERIZATION   PROCEDURE:  Left cardiac catheterization with selective left and right  coronary angiography, left ventriculography, via right groin using  Judkins technique.   INDICATIONS FOR PROCEDURE:  Ms. Pina is a 65 year old black female  with past medical history significant for hypertension,  hypercholesteremia, history of bronchial asthma, complains of  retrosternal chest tightness off and on without associated symptoms of  nausea, vomiting or diaphoresis.  Also complains of exertional dyspnea  with minimal exertion associated with feeling weak and tired.  Denies  any PND, orthopnea, leg swelling.  Denies palpitation, lightheadedness  or syncope.  The patient states she recently had stress test and 2-D  echocardiogram which were okay but continues to have chest tightness and  exertional dyspnea and was referred to me for further workup.  Denies  any relation of chest pain to food, breathing.  Denies any cough, fever  or chills.   PAST MEDICAL HISTORY:  As above.   PAST SURGICAL HISTORY:  She had tonsillectomy many years ago.   ALLERGIES:  No known drug allergies.   MEDICATIONS AT HOME:  1. Enteric-coated aspirin 81 mg p.o. daily.  2. Exforge 10/160 p.o. daily.  3. Advair 250/50 b.i.d.  4. Prevacid 30 mg p.o. daily.  5. Albuterol and Combivent inhalers.   SOCIAL HISTORY:  She is married, no children.  Smoked less than one pack  per day for 10 years, quit 30+ years ago.  No history of alcohol abuse.  She is retired.   FAMILY HISTORY:  Is positive for coronary artery disease.  Father died  of massive MI at the age of 81.  Mother  died of cervical cancer at the  age of 70.  One brother alive with coronary artery disease.  One aunt  has coronary artery disease.  One brother died of complications of  hepatitis.   PHYSICAL EXAMINATION:  GENERAL:  She is alert and oriented x3 in no  acute distress.  VITAL SIGNS:  Blood pressure was 130/80, pulse was 74 and regular.  HEENT:  Conjunctivae was pink.  NECK:  Supple.  No JVD, no bruit.  LUNGS:  Clear to auscultation without rhonchi or rales.  CARDIOVASCULAR:  S1 and S2 were normal.  There was soft systolic murmur.  ABDOMEN:  Soft.  Bowel sounds were present, obese, nontender.  EXTREMITIES:  There is no clubbing, cyanosis or edema.   IMPRESSION:  1. Recurrent chest pain, exertional dyspnea, rule out coronary      insufficiency.  2. Hypertension.  3. Hypercholesteremia.  4. Remote history of tobacco abuse.  5. Positive family history of coronary artery disease.  6. History of bronchial asthma.   I discussed with the patient regarding repeating the stress test versus  left catheterization, its risks and benefits; i.e.,  death, MI, stroke,  local vascular complications. Accepted and consented for the procedure.   PROCEDURE:  After obtaining informed consent, the patient was brought to  the catheterization lab and was placed on fluoroscopy table.  The right  groin was prepped and draped in usual fashion; 2% Xylocaine was used for  local anesthesia in the right groin. With the help of thin-wall needle,  a 4-French arterial sheath was placed.  The sheath was aspirated and  flushed.  Next, 4-French left Judkins catheter was advanced over the  wire under fluoroscopic guidance up to the ascending aorta.  Wire was  pulled out. The catheter was aspirated and connected to the manifold.  Catheter was further advanced and engaged into left coronary ostium.  Multiple views of the left system were taken.  Next, the catheter was  disengaged and was pulled out over the wire and was  replaced with 4-  [place] right 3D diagnostic catheter which was advanced over the wire  under fluoroscopic guidance into the ascending aorta.  Wire was pulled  out. The catheter was aspirated and connected to the manifold.  Catheter  was further advanced and engaged into right coronary ostium.  Multiple  views of the right system were taken.  Next, catheter was disengaged and  was pulled out over the wire and was replaced with 4-French pigtail  catheter which was advanced over the wire under fluoroscopic guidance  into the ascending aorta.  Wire was pulled out. The catheter was  aspirated and connected to the manifold.  Catheter was further advanced  across the aortic valve into the left ventricle.  LV pressures were  recorded. Next, left ventriculography was done in 30-degree RAO  position.  Post angiographic pressures were recorded from the left  ventricle, and then pullback pressures were recorded from the aorta.  There was no gradient across the aortic valve.  Next, the pigtail  catheter was pulled out over the wire.  Sheaths were aspirated and  flushed.   FINDINGS:  Left ventricle showed good LV systolic function, mild LVH, EF  of 55-60%.   The left main was patent.   LAD has 10-15% ostial stenosis and 20-30% proximal and mid stenosis.  Diagonal #1  was very, very small.  Diagonal #2 was small which has 20-  30% ostial stenosis.   Left circumflex has 15-20% proximal stenosis.  OM-1 is very, very small  OM #2 and #3 are of moderate size which were patent.  Ramus was very  small which was patent.   RCA has 5-10% proximal stenosis.  PDA was very, very small.  Posterolateral vessel branches were small which were patent.   The patient tolerated procedure well.  There were no complications.  The  patient was transferred to recovery room in stable condition.           ______________________________ Eduardo Osier Sharyn Lull, M.D.     [facility]/MEDQ  D:  02/25/2007  T:  02/25/2007  Job:   [id number]   cc:   Catheterization Lab

## 2011-03-05 ENCOUNTER — Telehealth: Payer: Self-pay | Admitting: *Deleted

## 2011-03-05 NOTE — Telephone Encounter (Signed)
Pt called regarding a nexium refill. Pt's PCP Dr. Smitty Knudsen had incr her dosage, but we had prescribed medication. Pt requesting refill of new dose. Notified pt that we will need to clear new dosage by Dr. Mariah Milling prior to refill. Pt states she will just wait for her f/u next week with TG and discuss at that time.

## 2011-03-16 ENCOUNTER — Ambulatory Visit: Payer: Medicare Other | Admitting: Cardiovascular Disease

## 2011-03-29 ENCOUNTER — Encounter: Payer: Self-pay | Admitting: Cardiovascular Disease

## 2011-04-02 ENCOUNTER — Ambulatory Visit (INDEPENDENT_AMBULATORY_CARE_PROVIDER_SITE_OTHER): Payer: Medicare Other | Admitting: Cardiovascular Disease

## 2011-04-02 ENCOUNTER — Encounter: Payer: Self-pay | Admitting: Cardiovascular Disease

## 2011-04-02 DIAGNOSIS — J449 Chronic obstructive pulmonary disease, unspecified: Secondary | ICD-10-CM | POA: Insufficient documentation

## 2011-04-02 DIAGNOSIS — Z87891 Personal history of nicotine dependence: Secondary | ICD-10-CM

## 2011-04-02 DIAGNOSIS — R0602 Shortness of breath: Secondary | ICD-10-CM

## 2011-04-02 DIAGNOSIS — J4489 Other specified chronic obstructive pulmonary disease: Secondary | ICD-10-CM | POA: Insufficient documentation

## 2011-04-02 DIAGNOSIS — I10 Essential (primary) hypertension: Secondary | ICD-10-CM

## 2011-04-02 DIAGNOSIS — E785 Hyperlipidemia, unspecified: Secondary | ICD-10-CM

## 2011-04-02 DIAGNOSIS — R079 Chest pain, unspecified: Secondary | ICD-10-CM

## 2011-04-02 NOTE — Assessment & Plan Note (Signed)
Atypical type rare chest pains. No further workup at this time.

## 2011-04-02 NOTE — Assessment & Plan Note (Signed)
Blood pressure is well controlled on today's visit. No changes made to the medications. 

## 2011-04-02 NOTE — Patient Instructions (Signed)
You are doing well. No medication changes were made. Please call us if you have new issues that need to be addressed before your next appt.  We will call you for a follow up Appt. In 12 months  

## 2011-04-02 NOTE — Progress Notes (Signed)
Patient ID: Kellie Phelps, female    DOB: April 08, 1946, 65 y.o.   MRN: [id number]  HPI Comments: 65 yo with history of HTN, hyperlipidemia, and prior cath with only mild coronary disease in 2008 presents for follow up.     Previous Pulmonary function test showed moderate COPD with bronchodilator response.  She takes Spiriva, advair,  and albuterol as needed. she stopped smoking many years ago.  Overall she feels that her shortness of breath is stable.  she feels well. Is active, no significant chest pain. She does have GERD symptoms. On dexilant. She reports that her primary care physician has scheduled an EGD and colonoscopy.    ETT-myoview In the past which showed poor exercise tolerance but no evidence for ischemia or infarction.   Echo was done showing normal systolic function and normal valves.     Labs (10/10): LDL 157, HDL 39, TG 175, K 3.9, creatinine 1.06       Outpatient Encounter Prescriptions as of 04/02/2011  Medication Sig Dispense Refill  . albuterol (PROAIR HFA) 108 (90 BASE) MCG/ACT inhaler Inhale 2 puffs into the lungs every 6 (six) hours as needed.        Marland Kitchen aspirin 81 MG tablet Take 81 mg by mouth daily.        . calcium carbonate (TUMS - DOSED IN MG ELEMENTAL CALCIUM) 500 MG chewable tablet Chew 1 tablet by mouth daily.        Marland Kitchen dexlansoprazole (DEXILANT) 60 MG capsule Take 60 mg by mouth daily.        . Fluticasone-Salmeterol (ADVAIR DISKUS) 250-50 MCG/DOSE AEPB Inhale 1 puff into the lungs every 12 (twelve) hours.        . Omega-3 Fatty Acids (FISH OIL) 1000 MG CAPS 1,000 mg. Take two tablets by mouth daily.       . simvastatin (ZOCOR) 40 MG tablet Take 40 mg by mouth at bedtime.        Marland Kitchen telmisartan (MICARDIS) 40 MG tablet Take 40 mg by mouth daily.        Marland Kitchen tiotropium (SPIRIVA) 18 MCG inhalation capsule Place 18 mcg into inhaler and inhale daily.               Review of Systems  Constitutional: Negative.   HENT: Negative.   Eyes: Negative.     Respiratory: Positive for shortness of breath.   Cardiovascular: Negative.   Gastrointestinal: Positive for abdominal pain.       GERD symptoms  Musculoskeletal: Negative.   Skin: Negative.   Neurological: Negative.   Hematological: Negative.   Psychiatric/Behavioral: Negative.   All other systems reviewed and are negative.    BP 122/78  Pulse 80  Ht 5\' 5"  (1.651 m)  Wt 232 lb (105.235 kg)  BMI 38.61 kg/m2  Physical Exam  Nursing note and vitals reviewed. Constitutional: She is oriented to person, place, and time. She appears well-developed and well-nourished.  HENT:  Head: Normocephalic.  Nose: Nose normal.  Mouth/Throat: Oropharynx is clear and moist.  Eyes: Conjunctivae are normal. Pupils are equal, round, and reactive to light.  Neck: Normal range of motion. Neck supple. No JVD present.  Cardiovascular: Normal rate, regular rhythm, S1 normal, S2 normal, normal heart sounds and intact distal pulses.  Exam reveals no gallop and no friction rub.   No murmur heard. Pulmonary/Chest: Effort normal and breath sounds normal. No respiratory distress. She has no wheezes. She has no rales. She exhibits no tenderness.  Abdominal: Soft. Bowel sounds  are normal. She exhibits no distension. There is no tenderness.  Musculoskeletal: Normal range of motion. She exhibits no edema and no tenderness.  Lymphadenopathy:    She has no cervical adenopathy.  Neurological: She is alert and oriented to person, place, and time. Coordination normal.  Skin: Skin is warm and dry. No rash noted. No erythema.  Psychiatric: She has a normal mood and affect. Her behavior is normal. Judgment and thought content normal.         Assessment and Plan

## 2011-04-02 NOTE — Assessment & Plan Note (Signed)
Cholesterol is at goal on the current lipid regimen. No changes to the medications were made.  

## 2011-04-02 NOTE — Assessment & Plan Note (Signed)
On inhalers. SOB stable.

## 2011-04-11 ENCOUNTER — Other Ambulatory Visit: Payer: Self-pay | Admitting: Cardiovascular Disease

## 2011-04-12 ENCOUNTER — Telehealth: Payer: Self-pay | Admitting: Cardiovascular Disease

## 2011-04-12 NOTE — Telephone Encounter (Signed)
Would like a referral to a pulmonologist.  States that she is having difficulty breathing.  Please advise.

## 2011-04-13 NOTE — Telephone Encounter (Signed)
Notified pharmacy need to contact Dr. Dario Guardian for a refill on albuterol.

## 2011-04-17 NOTE — Telephone Encounter (Signed)
Spoke to pt, she prefers [facility]. Will send referral to Dr. Meredeth Ide for COPD. Attempted to call office, no answer. Will call back tomorrow. PT states she prefers [facility], and I can leave msg on her vm if do not reach her.

## 2011-04-18 NOTE — Telephone Encounter (Signed)
Scheduled appt with Dr. Meredeth Ide, will call pt with appt date/time and records faxed.

## 2011-06-07 ENCOUNTER — Telehealth: Payer: Self-pay | Admitting: Cardiovascular Disease

## 2011-06-07 NOTE — Telephone Encounter (Signed)
Pt called stating that she has been experiencing some chest pains. Wants to talk to the nurse to see what she should do.

## 2011-06-07 NOTE — Telephone Encounter (Signed)
Spoke to pt she has been having atypical chest pain, pressure. Not having any problems at this time. She was seen last 2 months ago for same reason. She has h/o GERD takes dexilant and she has COPD, we referred her to Dr. Meredeth Ide and she cancelled appt. Pt just wants to schedule an appt to "make sure symptoms are not from her heart," and she will then see pulmonary. Pt forwarded to be scheduled for appt.

## 2011-06-13 ENCOUNTER — Ambulatory Visit: Payer: Medicare Other | Admitting: Cardiovascular Disease

## 2011-06-15 ENCOUNTER — Encounter: Payer: Self-pay | Admitting: Cardiovascular Disease

## 2011-06-15 LAB — POCT I-STAT, CHEM 8
BUN: 10
Calcium, Ion: 1.22
Chloride: 106
Glucose, Bld: 95
HCT: 47 — ABNORMAL HIGH
Hemoglobin: 16 — ABNORMAL HIGH
Potassium: 3.1 — ABNORMAL LOW
Sodium: 142
TCO2: 28

## 2011-06-15 LAB — POCT CARDIAC MARKERS: Troponin i, poc: <0.05

## 2011-06-15 LAB — CBC
HCT: 45.6
Hemoglobin: 15.1 — ABNORMAL HIGH
RBC: 5.27 — ABNORMAL HIGH

## 2011-06-15 LAB — DIFFERENTIAL
Lymphs Abs: 2.4
Monocytes Relative: 6
Neutro Abs: 3.7
Neutrophils Relative %: 55

## 2011-09-13 ENCOUNTER — Encounter: Payer: Self-pay | Admitting: Cardiology

## 2011-09-14 ENCOUNTER — Telehealth: Payer: Self-pay | Admitting: Cardiology

## 2011-09-14 ENCOUNTER — Encounter: Payer: Self-pay | Admitting: *Deleted

## 2011-09-14 ENCOUNTER — Other Ambulatory Visit: Payer: Self-pay | Admitting: Internal Medicine

## 2011-09-14 ENCOUNTER — Encounter: Payer: Self-pay | Admitting: Cardiology

## 2011-09-14 ENCOUNTER — Ambulatory Visit (INDEPENDENT_AMBULATORY_CARE_PROVIDER_SITE_OTHER): Payer: Medicare Other | Admitting: Cardiology

## 2011-09-14 DIAGNOSIS — E7800 Pure hypercholesterolemia, unspecified: Secondary | ICD-10-CM

## 2011-09-14 DIAGNOSIS — I10 Essential (primary) hypertension: Secondary | ICD-10-CM

## 2011-09-14 DIAGNOSIS — Z1231 Encounter for screening mammogram for malignant neoplasm of breast: Secondary | ICD-10-CM

## 2011-09-14 DIAGNOSIS — R079 Chest pain, unspecified: Secondary | ICD-10-CM

## 2011-09-14 DIAGNOSIS — R0602 Shortness of breath: Secondary | ICD-10-CM

## 2011-09-14 DIAGNOSIS — E785 Hyperlipidemia, unspecified: Secondary | ICD-10-CM

## 2011-09-14 LAB — LIPID PANEL: Cholesterol: 146 mg/dL (ref 0–200)

## 2011-09-14 NOTE — Progress Notes (Signed)
HPI The patient presents as a new patient for me. She has moved from [place].  She was seeing Dr. Mariah Milling for health maintenance issues.  She has no acute cardiovascular complaints. She gets some atypical chest discomfort that has been consistent with her asthma. She gets some shortness of breath from this as well. She's an active mostly because she doesn't want to exercise but she does get some dyspnea. She denies any substernal chest pressure, neck or arm discomfort. She's not having any palpitations, presyncope or syncope. She has had no PND or orthopnea  Allergies  Allergen Reactions  . Penicillins     Current Outpatient Prescriptions  Medication Sig Dispense Refill  . albuterol (PROAIR HFA) 108 (90 BASE) MCG/ACT inhaler Inhale 2 puffs into the lungs every 6 (six) hours as needed.        Marland Kitchen aspirin 81 MG tablet Take 81 mg by mouth daily.        . calcium carbonate (TUMS - DOSED IN MG ELEMENTAL CALCIUM) 500 MG chewable tablet Chew 1 tablet by mouth daily.        Marland Kitchen dexlansoprazole (DEXILANT) 60 MG capsule Take 60 mg by mouth daily.        . Fluticasone-Salmeterol (ADVAIR DISKUS) 250-50 MCG/DOSE AEPB Inhale 1 puff into the lungs every 12 (twelve) hours.        . Omega-3 Fatty Acids (FISH OIL) 1000 MG CAPS 1,000 mg. Take two tablets by mouth daily.       . simvastatin (ZOCOR) 40 MG tablet Take 40 mg by mouth at bedtime.        Marland Kitchen telmisartan (MICARDIS) 40 MG tablet Take 40 mg by mouth daily.        Marland Kitchen tiotropium (SPIRIVA) 18 MCG inhalation capsule Place 18 mcg into inhaler and inhale daily.          Past Medical History  Diagnosis Date  . Anxiety   . Asthma   . GERD (gastroesophageal reflux disease)   . Hypertension   . Hyperlipidemia   . Chest pain     LHC 6/08 EF 55-60%, 20-30% proximal LAD, LIs CFX, LIs RCA (Harwani). ETT-myoview 11/10, 3' stopped due to fatigue and chest tightness, EF normal, probably normal perfusion images with minimal soft tissue attenuation    Past Surgical  History  Procedure Date  . Tonsillectomy     ROS:  As stated in the HPI and negative for all other systems.  PHYSICAL EXAM BP 128/78  Pulse 77  Ht 5\' 5"  (1.651 m)  Wt 218 lb 12.8 oz (99.247 kg)  BMI 36.41 kg/m2 GENERAL:  Well appearing HEENT:  Pupils equal round and reactive, fundi not visualized, oral mucosa unremarkable, poor dentition NECK:  No jugular venous distention, waveform within normal limits, carotid upstroke brisk and symmetric, no bruits, no thyromegaly LYMPHATICS:  No cervical, inguinal adenopathy LUNGS:  Clear to auscultation bilaterally BACK:  No CVA tenderness CHEST:  Unremarkable HEART:  PMI not displaced or sustained,S1 and S2 within normal limits, no S3, no S4, no clicks, no rubs, no murmurs ABD:  Flat, positive bowel sounds normal in frequency in pitch, no bruits, no rebound, no guarding, no midline pulsatile mass, no hepatomegaly, no splenomegaly EXT:  2 plus pulses throughout, no edema, no cyanosis no clubbing SKIN:  No rashes no nodules NEURO:  Cranial nerves II through XII grossly intact, motor grossly intact throughout PSYCH:  Cognitively intact, oriented to person place and time  EKG: Sinus rhythm, rate 77, axis within  normal limits, intervals within normal limits, no acute ST-T wave changes.  Low voltage.   ASSESSMENT AND PLAN

## 2011-09-14 NOTE — Assessment & Plan Note (Signed)
The blood pressure is at target. No change in medications is indicated. We will continue with therapeutic lifestyle changes (TLC).  

## 2011-09-14 NOTE — Assessment & Plan Note (Signed)
I will check a lipid profile today as she is fasting.

## 2011-09-14 NOTE — Assessment & Plan Note (Signed)
I do not suspect a cardiac etiology to this. She will have this followed by her primary physician.

## 2011-09-14 NOTE — Patient Instructions (Signed)
Please have fasting blood work today  Follow up in 1 year with Dr Antoine Poche.  You will receive a letter in the mail 2 months before you are due.  Please call us when you receive this letter to schedule your follow up appointment.

## 2011-09-14 NOTE — Assessment & Plan Note (Signed)
Her chest pain is atypical.  She had In 2008 and stress testing in 2010. Her symptoms have not changed. I reviewed these. No change in therapy or further testing is indicated.

## 2011-09-14 NOTE — Telephone Encounter (Signed)
New Msg: Pt calling asking that we send fax and/or letterhead verify that pt was seen in our office today. Please send verification to Transportation and [employer] [phone].  Please return pt call to discuss further if necessary.

## 2011-09-24 ENCOUNTER — Encounter: Payer: Self-pay | Admitting: *Deleted

## 2011-09-27 ENCOUNTER — Other Ambulatory Visit: Payer: Self-pay | Admitting: Cardiology

## 2011-09-27 MED ORDER — DEXLANSOPRAZOLE 60 MG PO CPDR: 60.0000 mg | DELAYED_RELEASE_CAPSULE | Freq: Every day | ORAL | Status: DC

## 2011-09-27 NOTE — Telephone Encounter (Signed)
No samples available 

## 2011-09-27 NOTE — Telephone Encounter (Signed)
Pt also wants samples of dexilant please let her know

## 2011-10-12 ENCOUNTER — Ambulatory Visit: Payer: Medicare Other

## 2011-12-16 ENCOUNTER — Encounter (HOSPITAL_COMMUNITY): Payer: Self-pay

## 2011-12-16 ENCOUNTER — Emergency Department (HOSPITAL_COMMUNITY)
Admission: EM | Admit: 2011-12-16 | Discharge: 2011-12-17 | Disposition: A | Discharge status: Home / Self Care | Payer: Medicare Other | Attending: Emergency Medicine | Admitting: Emergency Medicine

## 2011-12-16 DIAGNOSIS — E785 Hyperlipidemia, unspecified: Secondary | ICD-10-CM | POA: Insufficient documentation

## 2011-12-16 DIAGNOSIS — Z79899 Other long term (current) drug therapy: Secondary | ICD-10-CM | POA: Insufficient documentation

## 2011-12-16 DIAGNOSIS — M7989 Other specified soft tissue disorders: Secondary | ICD-10-CM | POA: Insufficient documentation

## 2011-12-16 DIAGNOSIS — M25569 Pain in unspecified knee: Secondary | ICD-10-CM | POA: Insufficient documentation

## 2011-12-16 DIAGNOSIS — M79609 Pain in unspecified limb: Secondary | ICD-10-CM | POA: Insufficient documentation

## 2011-12-16 DIAGNOSIS — M79669 Pain in unspecified lower leg: Secondary | ICD-10-CM

## 2011-12-16 DIAGNOSIS — M25562 Pain in left knee: Secondary | ICD-10-CM

## 2011-12-16 DIAGNOSIS — I10 Essential (primary) hypertension: Secondary | ICD-10-CM | POA: Insufficient documentation

## 2011-12-16 LAB — D-DIMER, QUANTITATIVE: D-Dimer, Quant: 0.29 ug/mL-FEU (ref 0.00–0.48)

## 2011-12-16 MED ORDER — HYDROCODONE-ACETAMINOPHEN 5-325 MG PO TABS
1.0000 | ORAL_TABLET | Freq: Once | ORAL | Status: AC
  Administered 2011-12-17: 1 via ORAL
  Filled 2011-12-16: qty 1

## 2011-12-16 MED ORDER — HYDROCODONE-ACETAMINOPHEN 5-500 MG PO TABS: 1.0000 | ORAL_TABLET | Freq: Four times a day (QID) | ORAL | Status: AC | PRN

## 2011-12-16 NOTE — ED Notes (Signed)
Pt at present without distress. Updated on delay- vitals taken

## 2011-12-16 NOTE — Discharge Instructions (Signed)
Arthralgia Arthralgia is joint pain. A joint is a place where two bones meet. Joint pain can happen for many reasons. The joint can be bruised, stiff, infected, or weak from aging. Pain usually goes away after resting and taking medicine for soreness.  HOME CARE  Rest the joint as told by your doctor.   Keep the sore joint raised (elevated) for the first 24 hours.   Put ice on the joint area.   Put ice in a plastic bag.   Place a towel between your skin and the bag.   Leave the ice on for 15 to 20 minutes, 3 to 4 times a day.   Wear your splint, casting, elastic bandage, or sling as told by your doctor.   Only take medicine as told by your doctor. Do not take aspirin.   Use crutches as told by your doctor. Do not put weight on the joint until told to by your doctor.  GET HELP RIGHT AWAY IF:   You have bruising, puffiness (swelling), or more pain.   Your fingers or toes turn blue or start to lose feeling (numb).   Your medicine does not lessen the pain.   Your pain becomes severe.   You have a temperature by mouth above 102 F (38.9 C), not controlled by medicine.   You cannot move or use the joint.  MAKE SURE YOU:   Understand these instructions.   Will watch your condition.   Will get help right away if you are not doing well or get worse.  Document Released: 08/22/2009 Document Revised: 08/23/2011 Document Reviewed: 08/22/2009 [facility] Patient Information 2012 [place], [place]. There is no exact cause for your lower leg and knee pain, ear, d-dimer to rule out a DVT is negative.  You've been given a prescription for pain control and have a problem with Dr.  Shon Baton scheduled for Friday

## 2011-12-16 NOTE — ED Provider Notes (Signed)
History     CSN: [id number]  Arrival date & time 12/16/11  1615   First MD Initiated Contact with Patient 12/16/11 2122      Chief Complaint  Patient presents with  . Leg Pain    (Consider location/radiation/quality/duration/timing/severity/associated sxs/prior treatment) HPI Comments: Patient noticed 2 days ago, left calf  swelling, and tenderness.  Denies injury.  Has not had a recent trip does not take anticoagulants, is not short of breath or having chest pain  Patient is a 66 y.o. female presenting with leg pain. The history is provided by the patient.  Leg Pain  The incident occurred 2 days ago. The incident occurred at home. There was no injury mechanism. The pain is present in the left leg. The quality of the pain is described as aching. The pain is at a severity of 4/10. The pain is mild. The pain has been constant since onset. Pertinent negatives include no numbness, no inability to bear weight, no loss of motion, no muscle weakness, no loss of sensation and no tingling. The symptoms are aggravated by activity. She has tried elevation for the symptoms. The treatment provided no relief.    Past Medical History  Diagnosis Date  . Anxiety   . Asthma   . GERD (gastroesophageal reflux disease)   . Hypertension   . Hyperlipidemia   . Chest pain     LHC 6/08 EF 55-60%, 20-30% proximal LAD, LIs CFX, LIs RCA (Harwani). ETT-myoview 11/10, 3' stopped due to fatigue and chest tightness, EF normal, probably normal perfusion images with minimal soft tissue attenuation    Past Surgical History  Procedure Date  . Tonsillectomy     Family History  Problem Relation Age of Onset  . Uterine cancer Mother   . Heart attack Father   . Pulmonary embolism Brother   . Hepatitis Brother   . Coronary artery disease Brother   . Coronary artery disease Other     Aunt    History  Substance Use Topics  . Smoking status: Former Smoker    Quit date: 09/17/1978  . Smokeless tobacco: Not  on file  . Alcohol Use: No    OB History    Grav Para Term Preterm Abortions TAB SAB Ect Mult Living                  Review of Systems  Constitutional: Negative for fever and chills.  HENT: Negative for congestion.   Respiratory: Negative for shortness of breath.   Cardiovascular: Positive for leg swelling. Negative for chest pain and palpitations.  Skin: Negative for pallor.  Neurological: Negative for dizziness, tingling and numbness.    Allergies  Penicillins  Home Medications   Current Outpatient Rx  Name Route Sig Dispense Refill  . ASPIRIN 81 MG PO TABS Oral Take 162 mg by mouth daily.     Marland Kitchen CALCIUM CARBONATE-VITAMIN D 500-200 MG-UNIT PO TABS Oral Take 1 tablet by mouth daily.    . DEXLANSOPRAZOLE 60 MG PO CPDR Oral Take 1 capsule (60 mg total) by mouth daily. 90 capsule 3  . FLUTICASONE-SALMETEROL 250-50 MCG/DOSE IN AEPB Inhalation Inhale 1 puff into the lungs every 12 (twelve) hours.      Marland Kitchen NAPROXEN SODIUM 220 MG PO TABS Oral Take 440 mg by mouth at bedtime as needed. For pain.    Marland Kitchen FISH OIL 1000 MG PO CAPS  1,000 mg. Take two tablets by mouth daily.     Marland Kitchen SIMVASTATIN 40 MG PO TABS  Oral Take 40 mg by mouth at bedtime.      . TELMISARTAN 40 MG PO TABS Oral Take 40 mg by mouth daily.      Marland Kitchen TIOTROPIUM BROMIDE MONOHYDRATE 18 MCG IN CAPS Inhalation Place 18 mcg into inhaler and inhale daily.      . ALBUTEROL SULFATE HFA 108 (90 BASE) MCG/ACT IN AERS Inhalation Inhale 2 puffs into the lungs every 6 (six) hours as needed. Wheezing or shortness of breath.    Marland Kitchen HYDROCODONE-ACETAMINOPHEN 5-500 MG PO TABS Oral Take 1-2 tablets by mouth every 6 (six) hours as needed for pain. 15 tablet 0    BP 148/91  Pulse 70  Temp(Src) 97.7 F (36.5 C) (Oral)  Resp 18  Ht 5\' 6"  (1.676 m)  Wt 200 lb (90.719 kg)  BMI 32.28 kg/m2  SpO2 96%  Physical Exam  Constitutional: She is oriented to person, place, and time. She appears well-developed and well-nourished.  HENT:  Head:  Normocephalic.  Eyes: Pupils are equal, round, and reactive to light.  Neck: Normal range of motion.  Cardiovascular: Normal rate.   Pulmonary/Chest: Effort normal.  Abdominal: Soft.  Musculoskeletal:       Left lower leg: She exhibits tenderness and swelling. She exhibits no edema, no deformity and no laceration.       + hohmans sign , pain posterior knee   Neurological: She is alert and oriented to person, place, and time.  Skin: Skin is warm.    ED Course  Procedures (including critical care time)   Labs Reviewed  D-DIMER, QUANTITATIVE   No results found.   1. Knee pain, left   2. Lower leg pain     D-dimer is negative.  0.29  MDM  We'll obtain d-dimer to rule out DVT.  Risk factors are negative        Arman Filter, NP 12/16/11 2357

## 2011-12-16 NOTE — ED Notes (Signed)
Pt reports pain and swelling to left upper calf.  Denies injury- positive homans sign- Pt denies CP and SOB at present

## 2011-12-17 NOTE — ED Provider Notes (Signed)
Patient with painful swollen left calf for 2 days. On exam left calf is minimally swollen and tender not red or warm. Neurovascular intact she walks with slight limp favoring left lower extremity Pretest clinical probability for DVT is low patient has negative d-dimer Plan prescription for Vicodin She is to follow up with Dr.Brooks this week. She has a scheduled appointment. Medical screening examination/treatment/procedure(s) were conducted as a shared visit with non-physician practitioner(s) and myself.  I personally evaluated the patient during the encounter  Doug Sou, MD 12/17/11 [phone]

## 2012-04-16 ENCOUNTER — Ambulatory Visit: Payer: Medicare Other | Admitting: Physician Assistant

## 2012-04-30 ENCOUNTER — Encounter: Payer: Self-pay | Admitting: Cardiology

## 2012-04-30 ENCOUNTER — Ambulatory Visit (INDEPENDENT_AMBULATORY_CARE_PROVIDER_SITE_OTHER): Payer: Medicare Other | Admitting: Cardiology

## 2012-04-30 VITALS — BP 105/70 | HR 77 | Ht 65.0 in | Wt 209.8 lb

## 2012-04-30 DIAGNOSIS — R079 Chest pain, unspecified: Secondary | ICD-10-CM

## 2012-04-30 DIAGNOSIS — Z87891 Personal history of nicotine dependence: Secondary | ICD-10-CM

## 2012-04-30 DIAGNOSIS — E785 Hyperlipidemia, unspecified: Secondary | ICD-10-CM

## 2012-04-30 DIAGNOSIS — I10 Essential (primary) hypertension: Secondary | ICD-10-CM

## 2012-04-30 DIAGNOSIS — R0602 Shortness of breath: Secondary | ICD-10-CM

## 2012-04-30 NOTE — Patient Instructions (Addendum)
Your physician recommends that you schedule a follow-up appointment in: as needed  

## 2012-04-30 NOTE — Progress Notes (Signed)
HPI The patient presents for her second appt with me.  She has had evaluations in the past for atypical chest pain. Since I last saw her she has had no new problems. 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 She does activities such as vacuuming. With this she denies any chest pressure, neck or arm discomfort. She does get some chronic dyspnea but this is mild. She's had no change in this and no PND or orthopnea.  She does get some occasional fleeting left-sided chest discomfort.  She had 20 teeth removed since I last saw her.  Allergies  Allergen Reactions  . Penicillins Hives    Current Outpatient Prescriptions  Medication Sig Dispense Refill  . albuterol (PROAIR HFA) 108 (90 BASE) MCG/ACT inhaler Inhale 2 puffs into the lungs every 6 (six) hours as needed. Wheezing or shortness of breath.      Marland Kitchen aspirin 81 MG tablet Take 162 mg by mouth daily.       . calcium-vitamin D (OSCAL WITH D) 500-200 MG-UNIT per tablet Take 1 tablet by mouth daily.      Marland Kitchen dexlansoprazole (DEXILANT) 60 MG capsule Take 1 capsule (60 mg total) by mouth daily.  90 capsule  3  . Fluticasone-Salmeterol (ADVAIR DISKUS) 250-50 MCG/DOSE AEPB Inhale 1 puff into the lungs every 12 (twelve) hours.        . Multiple Vitamins-Minerals (ONE-A-DAY WOMENS 50 PLUS PO) Take by mouth daily.      . naproxen sodium (ANAPROX) 220 MG tablet Take 440 mg by mouth at bedtime as needed. For pain.      . Omega-3 Fatty Acids (FISH OIL) 1000 MG CAPS 1,000 mg. Take two tablets by mouth daily.       . simvastatin (ZOCOR) 40 MG tablet Take 40 mg by mouth at bedtime.        Marland Kitchen telmisartan (MICARDIS) 40 MG tablet Take 40 mg by mouth daily.        Marland Kitchen tiotropium (SPIRIVA) 18 MCG inhalation capsule Place 18 mcg into inhaler and inhale daily.          Past Medical History  Diagnosis Date  . Anxiety   . Asthma   . GERD (gastroesophageal reflux disease)   . Hypertension   . Hyperlipidemia   . Chest pain     LHC 6/08 EF 55-60%, 20-30%  proximal LAD, LIs CFX, LIs RCA (Harwani). ETT-myoview 11/10, 3' stopped due to fatigue and chest tightness, EF normal, probably normal perfusion images with minimal soft tissue attenuation    Past Surgical History  Procedure Date  . Tonsillectomy     ROS:  As stated in the HPI and negative for all other systems.  PHYSICAL EXAM BP 105/70  Pulse 77  Ht 5\' 5"  (1.651 m)  Wt 209 lb 12.8 oz (95.165 kg)  BMI 34.91 kg/m2 GENERAL:  Well appearing HEENT:  Pupils equal round and reactive, fundi not visualized, oral mucosa unremarkable, edentulous NECK:  No jugular venous distention, waveform within normal limits, carotid upstroke brisk and symmetric, no bruits, no thyromegaly LUNGS:  Clear to auscultation bilaterally BACK:  No CVA tenderness CHEST:  Unremarkable HEART:  PMI not displaced or sustained,S1 and S2 within normal limits, no S3, no S4, no clicks, no rubs, no murmurs ABD:  Flat, positive bowel sounds normal in frequency in pitch, no bruits, no rebound, no guarding, no midline pulsatile mass, no hepatomegaly, no splenomegaly EXT:  2 plus pulses throughout, no edema, no cyanosis no clubbing   EKG:  Sinus rhythm, rate 77, axis within normal limits, intervals within normal limits, no acute ST-T wave changes.  Low voltage.   ASSESSMENT AND PLAN  HYPERTENSION, UNSPECIFIED -  The blood pressure is at target. No change in medications is indicated. We will continue with therapeutic lifestyle changes (TLC).   HYPERLIPIDEMIA-MIXED -  In December her LDL was 77 with an HDL of 16.1. No change in therapy is indicated.  CHEST PAIN -  Her chest pain is atypical. She had cath in 2008 and stress testing in 2010. Her symptoms have not changed. I reviewed these. No change in therapy or further testing is indicated.  SHORTNESS OF BREATH -  She is currently not complaining of this.

## 2012-08-27 ENCOUNTER — Ambulatory Visit: Payer: Medicare Other | Admitting: Cardiology

## 2012-10-07 ENCOUNTER — Ambulatory Visit: Payer: Medicare Other | Admitting: Cardiology

## 2012-10-13 ENCOUNTER — Ambulatory Visit: Payer: Medicare Other | Admitting: Cardiology

## 2012-10-17 ENCOUNTER — Encounter: Payer: Self-pay | Admitting: Cardiology

## 2012-10-17 ENCOUNTER — Ambulatory Visit (INDEPENDENT_AMBULATORY_CARE_PROVIDER_SITE_OTHER): Payer: Medicare Other | Admitting: Cardiology

## 2012-10-17 VITALS — BP 114/78 | HR 76 | Ht 65.0 in | Wt 210.0 lb

## 2012-10-17 DIAGNOSIS — I10 Essential (primary) hypertension: Secondary | ICD-10-CM

## 2012-10-17 DIAGNOSIS — R079 Chest pain, unspecified: Secondary | ICD-10-CM

## 2012-10-17 DIAGNOSIS — Z87891 Personal history of nicotine dependence: Secondary | ICD-10-CM

## 2012-10-17 DIAGNOSIS — R0602 Shortness of breath: Secondary | ICD-10-CM

## 2012-10-17 DIAGNOSIS — J4489 Other specified chronic obstructive pulmonary disease: Secondary | ICD-10-CM

## 2012-10-17 DIAGNOSIS — J449 Chronic obstructive pulmonary disease, unspecified: Secondary | ICD-10-CM

## 2012-10-17 DIAGNOSIS — E785 Hyperlipidemia, unspecified: Secondary | ICD-10-CM

## 2012-10-17 DIAGNOSIS — Z79899 Other long term (current) drug therapy: Secondary | ICD-10-CM

## 2012-10-17 LAB — HEPATIC FUNCTION PANEL
ALT: 21 U/L (ref 0–35)
AST: 18 U/L (ref 0–37)
Alkaline Phosphatase: 84 U/L (ref 39–117)
Total Bilirubin: 0.5 mg/dL (ref 0.3–1.2)

## 2012-10-17 LAB — LIPID PANEL
HDL: 40.9 mg/dL (ref >39.00)
Triglycerides: 138 mg/dL (ref 0.0–149.0)

## 2012-10-17 NOTE — Progress Notes (Signed)
HPI The patient presents for followup of hypertension..  She has had evaluations in the past for atypical chest pain. Since I last saw her she has had no new problems. She's continues to take community, and classes but now on line. She doesn't exercise routinely.She denies any chest pressure, neck or arm discomfort. She does get some chronic dyspnea but this is mild. She's had no change in this and no PND or orthopnea.    Allergies  Allergen Reactions  . Penicillins Hives    Current Outpatient Prescriptions  Medication Sig Dispense Refill  . albuterol (PROAIR HFA) 108 (90 BASE) MCG/ACT inhaler Inhale 2 puffs into the lungs every 6 (six) hours as needed. Wheezing or shortness of breath.      Marland Kitchen aspirin 81 MG tablet Take 162 mg by mouth daily.       . calcium-vitamin D (OSCAL WITH D) 500-200 MG-UNIT per tablet Take 1 tablet by mouth daily.      Marland Kitchen dexlansoprazole (DEXILANT) 60 MG capsule Take 1 capsule (60 mg total) by mouth daily.  90 capsule  3  . Fluticasone-Salmeterol (ADVAIR DISKUS) 250-50 MCG/DOSE AEPB Inhale 1 puff into the lungs every 12 (twelve) hours.        . Multiple Vitamins-Minerals (ONE-A-DAY WOMENS 50 PLUS PO) Take by mouth daily.      . Omega-3 Fatty Acids (FISH OIL) 1000 MG CAPS 1,000 mg. Take two tablets by mouth daily.       . simvastatin (ZOCOR) 40 MG tablet Take 40 mg by mouth at bedtime.        Marland Kitchen telmisartan (MICARDIS) 40 MG tablet Take 40 mg by mouth daily.        Marland Kitchen tiotropium (SPIRIVA) 18 MCG inhalation capsule Place 18 mcg into inhaler and inhale daily.          Past Medical History  Diagnosis Date  . Anxiety   . Asthma   . GERD (gastroesophageal reflux disease)   . Hypertension   . Hyperlipidemia   . Chest pain     LHC 6/08 EF 55-60%, 20-30% proximal LAD, LIs CFX, LIs RCA (Harwani). ETT-myoview 11/10, 3' stopped due to fatigue and chest tightness, EF normal, probably normal perfusion images with minimal soft tissue attenuation    Past Surgical History    Procedure Date  . Tonsillectomy     ROS:  As stated in the HPI and negative for all other systems.  PHYSICAL EXAM BP 114/78  Pulse 76  Ht 5\' 5"  (1.651 m)  Wt 210 lb (95.255 kg)  BMI 34.95 kg/m2  SpO2 92% GENERAL:  Well appearing NECK:  No jugular venous distention, waveform within normal limits, carotid upstroke brisk and symmetric, no bruits, no thyromegaly LUNGS:  Clear to auscultation bilaterally CHEST:  Unremarkable HEART:  PMI not displaced or sustained,S1 and S2 within normal limits, no S3, no S4, no clicks, no rubs, no murmurs ABD:  Flat, positive bowel sounds normal in frequency in pitch, no bruits, no rebound, no guarding, no midline pulsatile mass, no hepatomegaly, no splenomegaly EXT:  2 plus pulses throughout, no edema, no cyanosis no clubbing   EKG: Sinus rhythm, rate 76, axis within normal limits, intervals within normal limits, no acute ST-T wave changes.  Low voltage.  10/17/2012   ASSESSMENT AND PLAN  HYPERTENSION, UNSPECIFIED -  The blood pressure is at target. No change in medications is indicated. We will continue with therapeutic lifestyle changes (TLC).   HYPERLIPIDEMIA-MIXED -  I will check a lipid  and liver profile today.    CHEST PAIN -  She has had no new symptoms. No further cardiovascular testing is suggested. She should continue with risk reduction.  SHORTNESS OF BREATH -  She gets some mild dyspnea probably related to deconditioning. No further workup is suggested. She can return to this clinic as needed.

## 2012-10-17 NOTE — Patient Instructions (Addendum)
The current medical regimen is effective;  continue present plan and medications.  Please have blood work today (lipid and liver profile)  Follow up as needed.

## 2012-11-06 ENCOUNTER — Encounter: Payer: Self-pay | Admitting: *Deleted

## 2012-11-07 ENCOUNTER — Telehealth: Payer: Self-pay | Admitting: Cardiology

## 2012-11-07 NOTE — Telephone Encounter (Signed)
Follow Up   Pt following up on call she got yesterday from you.

## 2012-11-07 NOTE — Telephone Encounter (Signed)
Per pt - she has not been taking her simvastatin and will restart it.  She will follow up with her PCP to have them check her lipids in 3 to 6 months

## 2013-02-05 ENCOUNTER — Ambulatory Visit (INDEPENDENT_AMBULATORY_CARE_PROVIDER_SITE_OTHER): Payer: Medicare Other | Admitting: Physician Assistant

## 2013-02-05 ENCOUNTER — Encounter: Payer: Self-pay | Admitting: Physician Assistant

## 2013-02-05 VITALS — BP 120/82 | HR 67 | Ht 65.0 in | Wt 210.8 lb

## 2013-02-05 DIAGNOSIS — I10 Essential (primary) hypertension: Secondary | ICD-10-CM

## 2013-02-05 DIAGNOSIS — J449 Chronic obstructive pulmonary disease, unspecified: Secondary | ICD-10-CM

## 2013-02-05 DIAGNOSIS — R05 Cough: Secondary | ICD-10-CM

## 2013-02-05 DIAGNOSIS — E785 Hyperlipidemia, unspecified: Secondary | ICD-10-CM

## 2013-02-05 DIAGNOSIS — R079 Chest pain, unspecified: Secondary | ICD-10-CM

## 2013-02-05 MED ORDER — ATORVASTATIN CALCIUM 40 MG PO TABS: 40.0000 mg | ORAL_TABLET | Freq: Every day | ORAL | Status: DC

## 2013-02-05 NOTE — Progress Notes (Signed)
[address] [place], [place]  [address] Phone: [phone] Fax:  [phone]  Date:  02/05/2013   ID:  Kellie Phelps, DOB [birth year], MRN [id number]  PCP:  Dorrene German, MD  Cardiologist:  Dr. Rollene Rotunda     History of Present Illness: Kellie Phelps is a 67 y.o. female who returns for follow up.  She has a hx of minimal coronary plaque by prior cardiac cath, chest pain, HTN, HL, GERD, anxiety.  LHC 6/08:  oLAD 10-15%, prox and mid LAD 20-30%, oD2 20-30%, pCFX 15-20%, pRCA 5-10%, EF 55-60%.  ETT-Myoview 07/2009:  EF 59%, no ischemia.  Echo 07/2009:  EF 60-65%, Gr 1 DD.  Last seen by Dr. Rollene Rotunda 10/2012 and told to f/u PRN.  She tells me she has been having chest tightness.  She does note it with exertion.  No associated dyspnea, radiation, nausea or diaphoresis.  She states this has been going on for a few mos.  It is not any worse.  She feels it is related to allergies.  She has COPD.  She has chronic DOE.  She is requesting a handicapped placard.  No orthopnea, PND, edema.  I offered to do a routine ETT to further evaluate her chest pain.  However, she also asked for antibiotics for "this bronchitis."  She apparently was recently treated by her PCP.  Denies significant cough or purulent sputum, fevers, hemoptysis.  I asked her to follow up with her PCP.  She then stated that she didn't want a stress test and would wait until her "regular cardiologist got back".    Labs (12/11):  K 3.8, Cr 1.07, Hgb 15.3  Labs (1/14):    ALT 21, LDL 120  Wt Readings from Last 3 Encounters:  02/05/13 210 lb 12.8 oz (95.618 kg)  10/17/12 210 lb (95.255 kg)  04/30/12 209 lb 12.8 oz (95.165 kg)     Past Medical History  Diagnosis Date  . Anxiety   . Asthma   . GERD (gastroesophageal reflux disease)   . Hypertension   . Hyperlipidemia   . Chest pain     LHC 6/08 EF 55-60%, 20-30% proximal LAD, LIs CFX, LIs RCA (Harwani). ETT-myoview 11/10, 3' stopped due to  fatigue and chest tightness, EF normal, probably normal perfusion images with minimal soft tissue attenuation    Current Outpatient Prescriptions  Medication Sig Dispense Refill  . aspirin 81 MG tablet Take 162 mg by mouth daily.       . calcium-vitamin D (OSCAL WITH D) 500-200 MG-UNIT per tablet Take 1 tablet by mouth daily.      Marland Kitchen dexlansoprazole (DEXILANT) 60 MG capsule Take 1 capsule (60 mg total) by mouth daily.  90 capsule  3  . Fluticasone-Salmeterol (ADVAIR DISKUS) 250-50 MCG/DOSE AEPB Inhale 1 puff into the lungs every 12 (twelve) hours.        . Multiple Vitamins-Minerals (ONE-A-DAY WOMENS 50 PLUS PO) Take by mouth daily.      . Omega-3 Fatty Acids (FISH OIL) 1000 MG CAPS 1,000 mg. Take two tablets by mouth daily.       . simvastatin (ZOCOR) 40 MG tablet Take 40 mg by mouth at bedtime.        Marland Kitchen telmisartan (MICARDIS) 40 MG tablet Take 40 mg by mouth daily.        Marland Kitchen tiotropium (SPIRIVA) 18 MCG inhalation capsule Place 18 mcg into inhaler and inhale daily.        Marland Kitchen albuterol (PROAIR  HFA) 108 (90 BASE) MCG/ACT inhaler Inhale 2 puffs into the lungs every 6 (six) hours as needed. Wheezing or shortness of breath.       No current facility-administered medications for this visit.    Allergies:    Allergies  Allergen Reactions  . Penicillins Hives    Social History:  The patient  reports that she quit smoking about 34 years ago. She does not have any smokeless tobacco history on file. She reports that she does not drink alcohol or use illicit drugs.   ROS:  Please see the history of present illness.      All other systems reviewed and negative.   PHYSICAL EXAM: VS:  BP 120/82  Pulse 67  Ht 5\' 5"  (1.651 m)  Wt 210 lb 12.8 oz (95.618 kg)  BMI 35.08 kg/m2 Well nourished, well developed, in no acute distress HEENT: normal Neck: no JVD Cardiac:  normal S1, S2; RRR; no murmur Chest:  No pain with palpation Lungs:  clear to auscultation bilaterally, no wheezing, rhonchi or  rales Abd: soft, nontender, no hepatomegaly Ext: no edema Skin: warm and dry Neuro:  CNs 2-12 intact, no focal abnormalities noted  EKG:  NSR, HR 67, NSSTTW changes     ASSESSMENT AND PLAN:  1. Chest Pain:  Atypical.  She has had a hx of this.  I offered a plain ETT (POET).  However, as noted, she declined any testing at this time.   2. Hypertension:  Controlled.  Continue current therapy.  3. Hyperlipidemia:  Patient had lipids done in 09/2012.  Plan was to change simvastatin to Lipitor.  Will d/c Simvastatin and start Lipitor 40 mg QD.  Check Lipids and LFTs in 8 weeks. 4. COPD:  I asked her to get her handicapped placard filled out by her PCP as her dyspnea from COPD limits her and her COPD is managed by him. 5. Cough:  Patient asked for antibiotics for "bronchitis."  I explained to her that there is no indication for this at this time.  I asked for her to f/u with her PCP as she tells me she recently took antibiotics from his due to "sinusitis."   6. Disposition:  F/u with Dr. Rollene Rotunda PRN.  Signed, Tereso Newcomer, PA-C  02/05/2013 10:37 AM

## 2013-02-05 NOTE — Patient Instructions (Addendum)
Your physician has recommended you make the following change in your medication: Stop Simvastatin and Start Lipitor ( 40 mg ) daily   Your physician recommends that you return for a FASTING lipid profile: on July 22 just walk in

## 2013-04-07 ENCOUNTER — Other Ambulatory Visit: Payer: Medicare Other

## 2013-06-10 ENCOUNTER — Other Ambulatory Visit: Payer: Self-pay

## 2013-06-10 DIAGNOSIS — Z1231 Encounter for screening mammogram for malignant neoplasm of breast: Secondary | ICD-10-CM

## 2013-06-26 ENCOUNTER — Ambulatory Visit: Payer: Medicare Other

## 2013-09-16 ENCOUNTER — Emergency Department (HOSPITAL_COMMUNITY)
Admission: EM | Admit: 2013-09-16 | Discharge: 2013-09-16 | Discharge status: Against Medical Advice | Payer: Medicare Other | Attending: Emergency Medicine | Admitting: Emergency Medicine

## 2013-09-16 ENCOUNTER — Encounter (HOSPITAL_COMMUNITY): Payer: Self-pay | Admitting: Emergency Medicine

## 2013-09-16 DIAGNOSIS — I10 Essential (primary) hypertension: Secondary | ICD-10-CM | POA: Insufficient documentation

## 2013-09-16 DIAGNOSIS — R300 Dysuria: Secondary | ICD-10-CM | POA: Insufficient documentation

## 2013-09-16 DIAGNOSIS — J45909 Unspecified asthma, uncomplicated: Secondary | ICD-10-CM | POA: Insufficient documentation

## 2013-09-16 DIAGNOSIS — Z87891 Personal history of nicotine dependence: Secondary | ICD-10-CM | POA: Insufficient documentation

## 2013-09-16 LAB — POCT I-STAT, CHEM 8
Chloride: 105 mEq/L (ref 96–112)
Glucose, Bld: 106 mg/dL — ABNORMAL HIGH (ref 70–99)
Hemoglobin: 15.6 g/dL — ABNORMAL HIGH (ref 12.0–15.0)
Sodium: 136 mEq/L — ABNORMAL LOW (ref 137–147)
TCO2: 25 mmol/L (ref 0–100)

## 2013-09-16 LAB — CBC
HCT: 43.6 % (ref 36.0–46.0)
MCH: 30.1 pg (ref 26.0–34.0)
MCHC: 35.1 g/dL (ref 30.0–36.0)
MCV: 85.7 fL (ref 78.0–100.0)
Platelets: 213 10*3/uL (ref 150–400)
RDW: 13.3 % (ref 11.5–15.5)

## 2013-09-16 NOTE — ED Notes (Signed)
No answer in waiting area.

## 2013-09-16 NOTE — ED Notes (Signed)
Pt c/o difficulty urinating and burning on urination with bil lower back pain.

## 2013-09-17 NOTE — ED Provider Notes (Signed)
I did not personally see or evaluate this patient.  Patient left without being seen after triage.  She was not seen by a physician or mid-level provider  Hoy Morn, MD 09/17/13 [phone]

## 2013-09-18 ENCOUNTER — Encounter (HOSPITAL_COMMUNITY): Payer: Self-pay | Admitting: Emergency Medicine

## 2013-09-18 ENCOUNTER — Emergency Department (INDEPENDENT_AMBULATORY_CARE_PROVIDER_SITE_OTHER)
Admission: EM | Admit: 2013-09-18 | Discharge: 2013-09-18 | Disposition: A | Discharge status: Home / Self Care | Payer: Medicare Other | Source: Home / Self Care | Attending: Family Medicine | Admitting: Family Medicine

## 2013-09-18 DIAGNOSIS — N342 Other urethritis: Secondary | ICD-10-CM

## 2013-09-18 LAB — POCT URINALYSIS DIP (DEVICE)
Bilirubin Urine: NEGATIVE
Glucose, UA: NEGATIVE mg/dL
HGB URINE DIPSTICK: NEGATIVE
KETONES UR: NEGATIVE mg/dL
Leukocytes, UA: NEGATIVE
Nitrite: NEGATIVE
PH: 5.5 (ref 5.0–8.0)
PROTEIN: NEGATIVE mg/dL
SPECIFIC GRAVITY, URINE: 1.025 (ref 1.005–1.030)
UROBILINOGEN UA: 0.2 mg/dL (ref 0.0–1.0)

## 2013-09-18 MED ORDER — ESTROGENS, CONJUGATED 0.625 MG/GM VA CREA: 1.0000 | TOPICAL_CREAM | Freq: Every day | VAGINAL | Status: DC

## 2013-09-18 NOTE — ED Notes (Signed)
Pt  Reports         Symptoms  Of  Urinary  Frequency  painfull  Urination  And  Low   abd  Pain

## 2013-09-18 NOTE — ED Provider Notes (Signed)
CSN: [id number]     Arrival date & time 09/18/13  1023 History   First MD Initiated Contact with Patient 09/18/13 1159     Chief Complaint  Patient presents with  . Urinary Frequency   (Consider location/radiation/quality/duration/timing/severity/associated sxs/prior Treatment) Patient is a 68 y.o. female presenting with frequency. The history is provided by the patient.  Urinary Frequency This is a new problem. The current episode started more than 2 days ago. The problem has been gradually worsening. Associated symptoms include abdominal pain. Pertinent negatives include no chest pain.    Past Medical History  Diagnosis Date  . Anxiety   . Asthma   . GERD (gastroesophageal reflux disease)   . Hypertension   . Hyperlipidemia   . Chest pain     LHC 6/08 EF 55-60%, 20-30% proximal LAD, LIs CFX, LIs RCA (Harwani). ETT-myoview 11/10, 3' stopped due to fatigue and chest tightness, EF normal, probably normal perfusion images with minimal soft tissue attenuation   Past Surgical History  Procedure Laterality Date  . Tonsillectomy     Family History  Problem Relation Age of Onset  . Uterine cancer Mother   . Heart attack Father   . Pulmonary embolism Brother   . Hepatitis Brother   . Coronary artery disease Brother   . Coronary artery disease Other     Aunt   History  Substance Use Topics  . Smoking status: Former Smoker    Quit date: 09/17/1978  . Smokeless tobacco: Not on file  . Alcohol Use: No   OB History   Grav Para Term Preterm Abortions TAB SAB Ect Mult Living                 Review of Systems  Constitutional: Negative.  Negative for fever and chills.  Cardiovascular: Negative for chest pain.  Gastrointestinal: Positive for abdominal pain. Negative for nausea and vomiting.  Genitourinary: Positive for dysuria, urgency and frequency.  Skin: Negative.     Allergies  Penicillins  Home Medications   Current Outpatient Rx  Name  Route  Sig  Dispense  Refill   . albuterol (PROAIR HFA) 108 (90 BASE) MCG/ACT inhaler   Inhalation   Inhale 2 puffs into the lungs every 6 (six) hours as needed. Wheezing or shortness of breath.         Marland Kitchen aspirin 81 MG tablet   Oral   Take 162 mg by mouth daily.          Marland Kitchen atorvastatin (LIPITOR) 40 MG tablet   Oral   Take 1 tablet (40 mg total) by mouth daily.   30 tablet   6   . calcium-vitamin D (OSCAL WITH D) 500-200 MG-UNIT per tablet   Oral   Take 1 tablet by mouth daily.         Marland Kitchen conjugated estrogens (PREMARIN) vaginal cream   Vaginal   Place 1 Applicatorful vaginally at bedtime. Twice a week   42.5 g   2   . dexlansoprazole (DEXILANT) 60 MG capsule   Oral   Take 1 capsule (60 mg total) by mouth daily.   90 capsule   3   . Fluticasone-Salmeterol (ADVAIR DISKUS) 250-50 MCG/DOSE AEPB   Inhalation   Inhale 1 puff into the lungs every 12 (twelve) hours.           . Multiple Vitamins-Minerals (ONE-A-DAY WOMENS 50 PLUS PO)   Oral   Take by mouth daily.         Marland Kitchen  Omega-3 Fatty Acids (FISH OIL) 1000 MG CAPS      1,000 mg. Take two tablets by mouth daily.          Marland Kitchen telmisartan (MICARDIS) 40 MG tablet   Oral   Take 40 mg by mouth daily.           Marland Kitchen tiotropium (SPIRIVA) 18 MCG inhalation capsule   Inhalation   Place 18 mcg into inhaler and inhale daily.            BP 160/88  Pulse 70  Temp(Src) 98.6 F (37 C) (Oral)  Resp 18  SpO2 100% Physical Exam  Nursing note and vitals reviewed. Constitutional: She is oriented to person, place, and time. She appears well-developed and well-nourished.  Neck: Normal range of motion. Neck supple.  Abdominal: Soft. Bowel sounds are normal. There is no tenderness.  Lymphadenopathy:    She has no cervical adenopathy.  Neurological: She is alert and oriented to person, place, and time.  Skin: Skin is warm and dry.    ED Course  Procedures (including critical care time) Labs Review Labs Reviewed  POCT URINALYSIS DIP (DEVICE)    Imaging Review No results found.  EKG Interpretation    Date/Time:    Ventricular Rate:    PR Interval:    QRS Duration:   QT Interval:    QTC Calculation:   R Axis:     Text Interpretation:              MDM      Billy Fischer, MD 09/18/13 1227

## 2013-09-25 ENCOUNTER — Ambulatory Visit: Payer: Medicare Other | Admitting: Cardiology

## 2013-10-29 ENCOUNTER — Ambulatory Visit: Payer: Medicare Other | Admitting: Cardiology

## 2013-12-15 ENCOUNTER — Ambulatory Visit: Payer: Medicare Other

## 2013-12-29 ENCOUNTER — Ambulatory Visit: Payer: Medicare Other

## 2013-12-31 ENCOUNTER — Ambulatory Visit (INDEPENDENT_AMBULATORY_CARE_PROVIDER_SITE_OTHER): Payer: Medicare Other | Admitting: Cardiology

## 2013-12-31 ENCOUNTER — Encounter: Payer: Self-pay | Admitting: Cardiology

## 2013-12-31 VITALS — BP 120/80 | HR 80 | Ht 66.0 in | Wt 226.0 lb

## 2013-12-31 DIAGNOSIS — R079 Chest pain, unspecified: Secondary | ICD-10-CM

## 2013-12-31 DIAGNOSIS — I10 Essential (primary) hypertension: Secondary | ICD-10-CM

## 2013-12-31 DIAGNOSIS — E785 Hyperlipidemia, unspecified: Secondary | ICD-10-CM

## 2013-12-31 DIAGNOSIS — J449 Chronic obstructive pulmonary disease, unspecified: Secondary | ICD-10-CM

## 2013-12-31 DIAGNOSIS — Z87891 Personal history of nicotine dependence: Secondary | ICD-10-CM

## 2013-12-31 MED ORDER — ATORVASTATIN CALCIUM 40 MG PO TABS: 40.0000 mg | ORAL_TABLET | Freq: Every day | ORAL | Status: DC

## 2013-12-31 NOTE — Patient Instructions (Signed)
Will obtain labs today and call you with the results (bmet)  Your physician has requested that you have a lexiscan myoview. For further information please visit [email or web address]. Please follow instruction sheet, as given.  Your physician recommends that you continue on your current medications as directed. Please refer to the Current Medication list given to you today.  Your physician wants you to follow-up in: 6 months You will receive a reminder letter in the mail two months in advance. If you don't receive a letter, please call our office to schedule the follow-up appointment.

## 2013-12-31 NOTE — Progress Notes (Signed)
HPI The patient presents for evaluation of chest pain.  She has a history of nonobstructive coronary disease. She was in [place] recently and she had some emotional stress. She developed chest discomfort. He was under her left breast. It was sharp. It was 10 out of 10. There was some nausea and apparently she had vomiting. She has had this kind of discomfort reproducibly with stress but not with physical exertion. He did not describe shortness of breath or diaphoresis. She did not have palpitations, presyncope or syncope. She seems to be rather sedentary but she's had no recent reproducible symptoms with the level of exertion that she does. She does not describe PND or orthopnea. She went to an emergency room in [place] where she was hypokalemic otherwise enzymes were negative. I don't have a copy of the EKG. They wanted to keep her overnight but she left AGAINST MEDICAL ADVICE.  Allergies  Allergen Reactions  . Penicillins Hives    Current Outpatient Prescriptions  Medication Sig Dispense Refill  . albuterol (PROAIR HFA) 108 (90 BASE) MCG/ACT inhaler Inhale 2 puffs into the lungs every 6 (six) hours as needed. Wheezing or shortness of breath.      Marland Kitchen aspirin 81 MG tablet Take 162 mg by mouth daily.       Marland Kitchen atorvastatin (LIPITOR) 40 MG tablet Take 1 tablet (40 mg total) by mouth daily.  30 tablet  6  . dexlansoprazole (DEXILANT) 60 MG capsule Take 1 capsule (60 mg total) by mouth daily.  90 capsule  3  . Fluticasone-Salmeterol (ADVAIR DISKUS) 250-50 MCG/DOSE AEPB Inhale 1 puff into the lungs as needed.       . Multiple Vitamins-Minerals (ONE-A-DAY WOMENS 50 PLUS PO) Take by mouth daily.      . Omega-3 Fatty Acids (FISH OIL) 1000 MG CAPS 1,000 mg. Take two tablets by mouth daily.       Marland Kitchen telmisartan (MICARDIS) 40 MG tablet Take 40 mg by mouth daily.        Marland Kitchen tiotropium (SPIRIVA) 18 MCG inhalation capsule Place 18 mcg into inhaler and inhale daily.         No current facility-administered  medications for this visit.    Past Medical History  Diagnosis Date  . Anxiety   . Asthma   . GERD (gastroesophageal reflux disease)   . Hypertension   . Hyperlipidemia   . Chest pain     LHC 6/08 EF 55-60%, 20-30% proximal LAD, LIs CFX, LIs RCA (Harwani). ETT-myoview 11/10, 3' stopped due to fatigue and chest tightness, EF normal, probably normal perfusion images with minimal soft tissue attenuation    Past Surgical History  Procedure Laterality Date  . Tonsillectomy      ROS:  As stated in the HPI and negative for all other systems.  PHYSICAL EXAM BP 120/80  Pulse 80  Ht 5\' 6"  (1.676 m)  Wt 226 lb (102.513 kg)  BMI 36.49 kg/m2 GENERAL:  Well appearing HEENT:  Pupils equal round and reactive, fundi not visualized, oral mucosa unremarkable NECK:  No jugular venous distention, waveform within normal limits, carotid upstroke brisk and symmetric, no bruits, no thyromegaly LYMPHATICS:  No cervical, inguinal adenopathy LUNGS:  Clear to auscultation bilaterally BACK:  No CVA tenderness CHEST:  Unremarkable HEART:  PMI not displaced or sustained,S1 and S2 within normal limits, no S3, no S4, no clicks, no rubs, no murmurs ABD:  Flat, positive bowel sounds normal in frequency in pitch, no bruits, no rebound, no guarding, no  midline pulsatile mass, no hepatomegaly, no splenomegaly EXT:  2 plus pulses throughout, no edema, no cyanosis no clubbing SKIN:  No rashes no nodules NEURO:  Cranial nerves II through XII grossly intact, motor grossly intact throughout PSYCH:  Cognitively intact, oriented to person place and time  EKG: Sinus rhythm, rate 80, axis within normal limits, intervals within normal limits, no acute ST-T wave changes.  Low voltage.  No change.  12/31/2013   ASSESSMENT AND PLAN  CHEST PAIN - The pain is somewhat atypical. However, given her previous nonobstructive disease further imaging is indicated. She would not be a walk on a treadmill and she didn't tolerated  previously. She will have a [employer].  HYPERTENSION, UNSPECIFIED -  The blood pressure is at target. No change in medications is indicated. We will continue with therapeutic lifestyle changes (TLC).   HYPERLIPIDEMIA-MIXED -  She has run out of her Lipitor and she will need a renewal of 40 mg Lipitor.  HYPOKALEMIA -  I will repeat a basic metabolic profile today.

## 2014-01-01 ENCOUNTER — Other Ambulatory Visit: Payer: Medicare Other

## 2014-01-11 ENCOUNTER — Other Ambulatory Visit: Payer: Medicare Other

## 2014-01-18 ENCOUNTER — Encounter: Payer: Self-pay | Admitting: Cardiology

## 2014-01-25 ENCOUNTER — Encounter (HOSPITAL_COMMUNITY): Payer: Medicare Other

## 2014-01-26 ENCOUNTER — Ambulatory Visit: Payer: Medicare Other | Admitting: Cardiology

## 2014-06-04 ENCOUNTER — Encounter: Payer: Self-pay | Admitting: *Deleted

## 2014-06-04 ENCOUNTER — Institutional Professional Consult (permissible substitution): Payer: Medicare Other | Admitting: Cardiovascular Disease

## 2014-06-29 ENCOUNTER — Ambulatory Visit: Payer: Medicare Other | Admitting: Cardiology

## 2015-01-04 ENCOUNTER — Other Ambulatory Visit: Payer: Self-pay | Admitting: Family Medicine

## 2015-01-04 DIAGNOSIS — Z0000 Encounter for general adult medical examination without abnormal findings: Secondary | ICD-10-CM

## 2015-01-06 ENCOUNTER — Ambulatory Visit (INDEPENDENT_AMBULATORY_CARE_PROVIDER_SITE_OTHER): Payer: Medicare Other | Admitting: Cardiovascular Disease

## 2015-01-06 ENCOUNTER — Encounter: Payer: Self-pay | Admitting: Cardiovascular Disease

## 2015-01-06 VITALS — BP 100/64 | HR 86 | Ht 65.0 in | Wt 215.5 lb

## 2015-01-06 DIAGNOSIS — R0789 Other chest pain: Secondary | ICD-10-CM

## 2015-01-06 DIAGNOSIS — J439 Emphysema, unspecified: Secondary | ICD-10-CM | POA: Diagnosis not present

## 2015-01-06 DIAGNOSIS — E785 Hyperlipidemia, unspecified: Secondary | ICD-10-CM | POA: Diagnosis not present

## 2015-01-06 DIAGNOSIS — I10 Essential (primary) hypertension: Secondary | ICD-10-CM

## 2015-01-06 DIAGNOSIS — R079 Chest pain, unspecified: Secondary | ICD-10-CM | POA: Diagnosis not present

## 2015-01-06 DIAGNOSIS — R0602 Shortness of breath: Secondary | ICD-10-CM

## 2015-01-06 NOTE — Assessment & Plan Note (Signed)
Stable on her inhalers

## 2015-01-06 NOTE — Assessment & Plan Note (Signed)
Cholesterol is at goal on the current lipid regimen. No changes to the medications were made.  

## 2015-01-06 NOTE — Progress Notes (Signed)
Patient ID: Kellie Phelps, female    DOB: Apr 21, 1946, 69 y.o.   MRN: [id number]  HPI Comments:  [name] is a very pleasant 69 year old woman with history of hyperlipidemia , borderline elevated glucose levels, hypertension , GERD who presents for routine follow-up for her chest pain.   She was seen last year by Dr. Percival Spanish  For chest pain.  She had an episode while in [place] probably brought on emotional stress  pain was under her left breast,  sharp.  10 out of 10. some nausea and apparently she had vomiting.  was recommended that she undergo a stress test but this was not performed   She's had no further episodes of significant chest pain since then but does occasionally have some discomfort underneath her left breast in the xiphoid area.  She thinks this is from GERD.  No significant symptoms with exertion, otherwise is active, takes care of a 71-year-old grandchild.   EKG on today's visit shows normal sinus rhythm with rate 86 bpm, no significant ST or T-wave changes   Other past medical history  2015 was seen in theemergency room in [place]  For chest pain, where she was hypokalemic otherwise enzymes were negative.They wanted to keep her overnight but she left AGAINST MEDICAL ADVICE.   Allergies  Allergen Reactions  . Penicillins Hives    Outpatient Encounter Prescriptions as of 01/06/2015  Medication Sig  . albuterol (PROAIR HFA) 108 (90 BASE) MCG/ACT inhaler Inhale 2 puffs into the lungs every 6 (six) hours as needed. Wheezing or shortness of breath.  Marland Kitchen aspirin 81 MG tablet Take 162 mg by mouth daily.   Marland Kitchen atorvastatin (LIPITOR) 40 MG tablet Take 1 tablet (40 mg total) by mouth daily.  Marland Kitchen dexlansoprazole (DEXILANT) 60 MG capsule Take 1 capsule (60 mg total) by mouth daily.  . Fluticasone-Salmeterol (ADVAIR DISKUS) 250-50 MCG/DOSE AEPB Inhale 1 puff into the lungs as needed.   . Multiple Vitamins-Minerals (ONE-A-DAY WOMENS 50 PLUS PO) Take by mouth daily.  .  Omega-3 Fatty Acids (FISH OIL) 1000 MG CAPS 1,000 mg. Take two tablets by mouth daily.   Marland Kitchen telmisartan (MICARDIS) 40 MG tablet Take 40 mg by mouth daily.    Marland Kitchen tiotropium (SPIRIVA) 18 MCG inhalation capsule Place 18 mcg into inhaler and inhale daily.      Past Medical History  Diagnosis Date  . Anxiety   . Asthma   . GERD (gastroesophageal reflux disease)   . Hypertension   . Hyperlipidemia   . Chest pain     LHC 6/08 EF 55-60%, 20-30% proximal LAD, LIs CFX, LIs RCA (Harwani). ETT-myoview 11/10, 3' stopped due to fatigue and chest tightness, EF normal, probably normal perfusion images with minimal soft tissue attenuation    Past Surgical History  Procedure Laterality Date  . Tonsillectomy      Social History  reports that she quit smoking about 36 years ago. She does not have any smokeless tobacco history on file. She reports that she does not drink alcohol or use illicit drugs.  Family History family history includes Coronary artery disease in her brother and other; Heart attack in her father; Hepatitis in her brother; Pulmonary embolism in her brother; Uterine cancer in her mother.   Review of Systems  Constitutional: Negative.   Respiratory: Negative.   Cardiovascular: Positive for chest pain.  Gastrointestinal: Negative.   Musculoskeletal: Negative.   Skin: Negative.   Neurological: Negative.   Hematological: Negative.   Psychiatric/Behavioral: Negative.   All  other systems reviewed and are negative.   BP 100/64 mmHg  Pulse 86  Ht 5\' 5"  (1.651 m)  Wt 215 lb 8 oz (97.75 kg)  BMI 35.86 kg/m2  Physical Exam  Constitutional: She is oriented to person, place, and time. She appears well-developed and well-nourished.  Obese  HENT:  Head: Normocephalic.  Nose: Nose normal.  Mouth/Throat: Oropharynx is clear and moist.  Eyes: Conjunctivae are normal. Pupils are equal, round, and reactive to light.  Neck: Normal range of motion. Neck supple. No JVD present.   Cardiovascular: Normal rate, regular rhythm, S1 normal, S2 normal, normal heart sounds and intact distal pulses.  Exam reveals no gallop and no friction rub.   No murmur heard. Pulmonary/Chest: Effort normal and breath sounds normal. No respiratory distress. She has no wheezes. She has no rales. She exhibits no tenderness.  Abdominal: Soft. Bowel sounds are normal. She exhibits no distension. There is no tenderness.  Musculoskeletal: Normal range of motion. She exhibits no edema or tenderness.  Lymphadenopathy:    She has no cervical adenopathy.  Neurological: She is alert and oriented to person, place, and time. Coordination normal.  Skin: Skin is warm and dry. No rash noted. No erythema.  Psychiatric: She has a normal mood and affect. Her behavior is normal. Judgment and thought content normal.    Assessment and Plan  Nursing note and vitals reviewed.

## 2015-01-06 NOTE — Assessment & Plan Note (Signed)
Blood pressure is well controlled on today's visit. No changes made to the medications. 

## 2015-01-06 NOTE — Patient Instructions (Signed)
You are doing well. No medication changes were made.  Please call us if you have new issues that need to be addressed before your next appt.  Your physician wants you to follow-up in: 12 months.  You will receive a reminder letter in the mail two months in advance. If you don't receive a letter, please call our office to schedule the follow-up appointment. 

## 2015-01-06 NOTE — Assessment & Plan Note (Signed)
Chronic baseline shortness of breath. Likely from COPD, deconditioning, obesity. Stable at this time

## 2015-01-06 NOTE — Assessment & Plan Note (Signed)
Atypical chest pain. She did not complete the stress test last year. She reports that she is active without symptoms. We'll continue to monitor her symptoms for now

## 2015-01-11 ENCOUNTER — Other Ambulatory Visit: Payer: Self-pay | Admitting: Family Medicine

## 2015-01-11 DIAGNOSIS — Z139 Encounter for screening, unspecified: Secondary | ICD-10-CM

## 2015-01-19 ENCOUNTER — Other Ambulatory Visit: Payer: Medicaid Other

## 2015-01-19 ENCOUNTER — Inpatient Hospital Stay
Admission: RE | Admit: 2015-01-19 | Discharge: 2015-01-19 | Disposition: A | Discharge status: Home / Self Care | Payer: Medicaid Other | Source: Ambulatory Visit

## 2015-02-03 ENCOUNTER — Other Ambulatory Visit: Payer: Self-pay | Admitting: Family Medicine

## 2015-02-03 ENCOUNTER — Ambulatory Visit
Admission: RE | Admit: 2015-02-03 | Discharge: 2015-02-03 | Disposition: A | Discharge status: Home / Self Care | Payer: Medicare Other | Source: Ambulatory Visit | Attending: Family Medicine | Admitting: Family Medicine

## 2015-02-03 DIAGNOSIS — M8580 Other specified disorders of bone density and structure, unspecified site: Secondary | ICD-10-CM | POA: Insufficient documentation

## 2015-02-03 DIAGNOSIS — Z139 Encounter for screening, unspecified: Secondary | ICD-10-CM

## 2015-02-03 DIAGNOSIS — Z0000 Encounter for general adult medical examination without abnormal findings: Secondary | ICD-10-CM

## 2015-02-03 DIAGNOSIS — Z1231 Encounter for screening mammogram for malignant neoplasm of breast: Secondary | ICD-10-CM | POA: Insufficient documentation

## 2015-03-29 ENCOUNTER — Telehealth: Payer: Self-pay | Admitting: *Deleted

## 2015-03-29 ENCOUNTER — Telehealth: Payer: Self-pay | Admitting: Physician Assistant

## 2015-03-29 ENCOUNTER — Telehealth: Payer: Self-pay

## 2015-03-29 NOTE — Telephone Encounter (Signed)
Transport agency called to check status of visit routine vs urgent as patient called after 3 day notice time as usually required for arranging transport.  Kellie Phelps was notified that per privacy laws I am not aloud to discuss or release patient information.    No patient information was given.  However [name] asked about location and  was notified that she called a cardiology clinic and that we are scheduling appts several weeks to months out at this time.

## 2015-03-29 NOTE — Telephone Encounter (Signed)
Obtained NPI [id number] from PCP at [place] but [facility] is on [place] card.  Will have patient sign waiver at check in.  03-29-15 [facility]

## 2015-03-29 NOTE — Telephone Encounter (Signed)
Pt c/o of Chest Pain: STAT if CP now or developed within 24 hours  1. Are you having CP right now? Not right now, stating its a sharp pain.   2. Are you experiencing any other symptoms (ex. SOB, nausea, vomiting, sweating)? SOB but it may be her COPD acting up not sure.   3. How long have you been experiencing CP? Sharp pains for a few days. Under left breast   4. Is your CP continuous or coming and going? Coming and going.   5. Have you taken Nitroglycerin? Does not take this.  ? Pt mentioned she has COPD and SOB may be with this and would like to make sure this is not serious.

## 2015-03-29 NOTE — Telephone Encounter (Signed)
Left msg for pt to call back

## 2015-03-31 ENCOUNTER — Ambulatory Visit (INDEPENDENT_AMBULATORY_CARE_PROVIDER_SITE_OTHER): Payer: Medicare Other | Admitting: Physician Assistant

## 2015-03-31 ENCOUNTER — Encounter: Payer: Self-pay | Admitting: Physician Assistant

## 2015-03-31 VITALS — BP 90/70 | HR 91 | Ht 65.0 in | Wt 220.0 lb

## 2015-03-31 DIAGNOSIS — R0789 Other chest pain: Secondary | ICD-10-CM | POA: Diagnosis not present

## 2015-03-31 DIAGNOSIS — R739 Hyperglycemia, unspecified: Secondary | ICD-10-CM | POA: Insufficient documentation

## 2015-03-31 DIAGNOSIS — R079 Chest pain, unspecified: Secondary | ICD-10-CM | POA: Diagnosis not present

## 2015-03-31 DIAGNOSIS — I5189 Other ill-defined heart diseases: Secondary | ICD-10-CM

## 2015-03-31 DIAGNOSIS — Z87891 Personal history of nicotine dependence: Secondary | ICD-10-CM

## 2015-03-31 DIAGNOSIS — R0602 Shortness of breath: Secondary | ICD-10-CM | POA: Diagnosis not present

## 2015-03-31 DIAGNOSIS — E785 Hyperlipidemia, unspecified: Secondary | ICD-10-CM

## 2015-03-31 DIAGNOSIS — I10 Essential (primary) hypertension: Secondary | ICD-10-CM

## 2015-03-31 DIAGNOSIS — I519 Heart disease, unspecified: Secondary | ICD-10-CM

## 2015-03-31 DIAGNOSIS — I2510 Atherosclerotic heart disease of native coronary artery without angina pectoris: Secondary | ICD-10-CM

## 2015-03-31 DIAGNOSIS — I340 Nonrheumatic mitral (valve) insufficiency: Secondary | ICD-10-CM

## 2015-03-31 DIAGNOSIS — J439 Emphysema, unspecified: Secondary | ICD-10-CM

## 2015-03-31 MED ORDER — TELMISARTAN 20 MG PO TABS: 20.0000 mg | ORAL_TABLET | Freq: Every day | ORAL | Status: DC

## 2015-03-31 NOTE — Patient Instructions (Addendum)
Medication Instructions:  Your physician has recommended you make the following change in your medication:  DECREASE micardis to 20mg  once a day   Labwork: Your physician recommends that you have labs today: Lipid, CMET   Testing/Procedures: Your physician has requested that you have an echocardiogram. Echocardiography is a painless test that uses sound waves to create images of your heart. It provides your doctor with information about the size and shape of your heart and how well your heart's chambers and valves are working. This procedure takes approximately one hour. There are no restrictions for this procedure.  Your physician has requested that you have a lexiscan myoview. For further information please visit [email or web address]. Please follow instruction sheet, as given.  [place]  Your caregiver has ordered a Stress Test with nuclear imaging. The purpose of this test is to evaluate the blood supply to your heart muscle. This procedure is referred to as a "Non-Invasive Stress Test." This is because other than having an IV started in your vein, nothing is inserted or "invades" your body. Cardiac stress tests are done to find areas of poor blood flow to the heart by determining the extent of coronary artery disease (CAD). Some patients exercise on a treadmill, which naturally increases the blood flow to your heart, while others who are  unable to walk on a treadmill due to physical limitations have a pharmacologic/chemical stress agent called Lexiscan . This medicine will mimic walking on a treadmill by temporarily increasing your coronary blood flow.   Please note: these test may take anywhere between 2-4 hours to complete  PLEASE REPORT TO [place] TO GO  Date of Procedure: Wednesday, July 20, 8:30am Arrival Time for Procedure: 8:00am   PLEASE NOTIFY THE OFFICE AT LEAST 24 HOURS IN ADVANCE IF YOU ARE  UNABLE TO KEEP YOUR APPOINTMENT.  [phone] AND  PLEASE NOTIFY NUCLEAR MEDICINE AT [facility] AT LEAST 24 HOURS IN ADVANCE IF YOU ARE UNABLE TO KEEP YOUR APPOINTMENT. [phone]  How to prepare for your Myoview test:   Do not eat or drink after midnight  No caffeine for 24 hours prior to test  No smoking 24 hours prior to test.  Your medication may be taken with water.  If your doctor stopped a medication because of this test, do not take that medication.  Ladies, please do not wear dresses.  Skirts or pants are appropriate. Please wear a short sleeve shirt.  No perfume, cologne or lotion.  Wear comfortable walking shoes. No heels!            Follow-Up: Your physician recommends that you schedule a follow-up appointment in: three months with Dr. Rockey Situ   Any Other Special Instructions Will Be Listed Below (If Applicable).  Nuclear Medicine Exam A nuclear medicine exam is a safe and painless imaging test. It helps to detect and diagnose disease in the body as well as provide information about organ function and structure.  Nuclear scans are most often done of the:  Lungs.  Heart.  Thyroid gland.  Bones.  Abdomen. HOW A NUCLEAR MEDICINE EXAM WORKS A nuclear medicine exam works by using a radioactive tracer. The material is given either by an IV (intravenous) injection or it may be swallowed. After the tracer is in the body, it is absorbed by your body's organs. A large scanning machine that uses a special camera detects the radioactivity in your body. A computerized image is  then formed regarding the area of concern. The small amounts of radioactive material used in a nuclear medicine exam are found to be medically safe. However, because radioactive material is used, this test is not done if you are pregnant or nursing.  BEFORE THE PROCEDURE  If available, bring previous imaging studies such as x-rays, etc. with you to the exam.  Arrive early for your  exam. PROCEDURE  An IV may be started before the exam begins.  Depending on the type of examination, will lie on a table or sit in a chair during the exam.  The nuclear medicine exam will take about 30 to 60 minutes to complete. AFTER THE PROCEDURE  After your scan is completed, the image(s) will be evaluated by a specialist. It is important that you follow up with your caregiver to find out your test results.  You may return to your regular activity as instructed by your caregiver. SEEK IMMEDIATE MEDICAL CARE IF: You have shortness of breath or difficulty breathing. MAKE SURE YOU:   Understand these instructions.  Will watch your condition.  Will get help right away if you are not doing well or get worse. Document Released: 10/11/2004 Document Revised: 11/26/2011 Document Reviewed: 11/25/2008 [facility] Patient Information 2015 [place], [place]. This information is not intended to replace advice given to you by your health care provider. Make sure you discuss any questions you have with your health care provider.

## 2015-03-31 NOTE — Progress Notes (Signed)
Cardiology Office Note:  Date of Encounter: 03/31/2015  ID: Kellie Phelps, DOB 01/07/1946, MRN [id number]  PCP:  Herminio Commons, MD Primary Cardiologist:  Dr. Rockey Situ, MD  Chief Complaint  Patient presents with  . other    C/o chest pain. Meds reviewed verbally with pt.    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   She underwent an ETT Myoview in November 2010 and exercised for 3 minutes. She was stopped due to fatigue and chest tightness. EF was normal, and likely normal perfusion images with minimal soft tissue attenuation. Echo at that time .in 2010 showed EF 60-65%, GR1DD, no AI, trivial MR, normal LA. She was seen by Dr. Percival Spanish, MD in 2015 for episode of chest pain under her left breast in [place] brought on by emotional stress. At that time there was some associated nausea and vomiting. It was recommended she undergo a stress test though this was not performed. She followed up with Dr. Rockey Situ, MD on 12/2014 and reported no further episodes of significant chest pain since her visit with Dr. Percival Spanish, but did occasionally have discomfort underneath her left breast in the xiphoid area. At that time no further work up was advised.   She called the office on 7/12 with complaints of intermittent sharp chest pain underneath her left breast again. Pain was brief, lasting only a couple of seconds before self resolving. Pain returned later that day again lasting only a couple of seconds before self resolving. Symptoms occurred at rest. Never with symptoms with exertion. She denies any recent stressors. She has not had the pain since 7/12. No associated nausea, vomiting, diaphoresis, palpitations, edema, presyncope, or syncope. She does have increased nasal and chest congestion  which she saw her PCP today for and was diagnosed with a URI. No antibiotics were given. She has been afebrile. Weight has been stable. No lower extremity edema, abdominal destination, or early satiety. She does watch her salt intake and does not drink excess fluids. She is currently without any chest pain.      Past Medical History  Diagnosis Date  . Anxiety   . COPD (chronic obstructive pulmonary disease)   . GERD (gastroesophageal reflux disease)   . Hypertension   . Hyperlipidemia   . Coronary artery disease, non-occlusive     a. LHC 6/08 EF 55-60%, 20-30% proximal LAD, LIs CFX, LIs RCA (Harwani); b. ETT-myoview 11/10, 3' stopped due to fatigue and chest tightness, EF normal, probably normal perfusion images with minimal soft tissue attenuation  . Diastolic dysfunction     a. echo 2010: EF 60-65%, GR1DD, no AI, trivial MR, LA nl  . Atypical chest pain   . Hyperglycemia   :  Past Surgical History  Procedure Laterality Date  . Tonsillectomy    :  Social History:  The patient  reports that she quit smoking about 36 years ago. She does not have any smokeless tobacco history on file. She reports that she does not drink alcohol or use illicit drugs.   Family History  Problem Relation Age of Onset  . Uterine cancer Mother   . Heart attack Father   . Pulmonary embolism Brother   . Hepatitis Brother   . Coronary artery disease Brother   . Coronary artery disease Other  Aunt     Allergies:  Allergies  Allergen Reactions  . Penicillins Hives     Home Medications:  Current Outpatient Prescriptions  Medication Sig Dispense Refill  . albuterol (PROAIR HFA) 108 (90 BASE) MCG/ACT inhaler Inhale 2 puffs into the lungs every 6 (six) hours as needed. Wheezing or shortness of breath.    Marland Kitchen aspirin 81 MG tablet Take 81 mg by mouth daily.     Marland Kitchen atorvastatin (LIPITOR) 40 MG tablet Take 1 tablet (40 mg total) by mouth daily. 30 tablet 6  . dexlansoprazole (DEXILANT) 60 MG capsule  Take 1 capsule (60 mg total) by mouth daily. 90 capsule 3  . Fluticasone-Salmeterol (ADVAIR DISKUS) 250-50 MCG/DOSE AEPB Inhale 1 puff into the lungs as needed.     . Multiple Vitamins-Minerals (ONE-A-DAY WOMENS 50 PLUS PO) Take by mouth daily.    . Omega-3 Fatty Acids (FISH OIL) 1000 MG CAPS 1,000 mg. Take two tablets by mouth daily.     Marland Kitchen telmisartan (MICARDIS) 40 MG tablet Take 40 mg by mouth daily.      Marland Kitchen tiotropium (SPIRIVA) 18 MCG inhalation capsule Place 18 mcg into inhaler and inhale daily.       No current facility-administered medications for this visit.     Review of Systems:  Review of Systems  Constitutional: Negative for fever, chills, weight loss, malaise/fatigue and diaphoresis.  HENT: Positive for congestion.   Eyes: Negative for blurred vision, discharge and redness.  Respiratory: Positive for cough, shortness of breath and wheezing. Negative for hemoptysis and sputum production.   Cardiovascular: Positive for chest pain. Negative for palpitations, orthopnea, claudication, leg swelling and PND.  Gastrointestinal: Negative for heartburn, nausea and vomiting.  Musculoskeletal: Positive for myalgias. Negative for falls.  Skin: Negative for rash.  Neurological: Positive for headaches. Negative for dizziness, sensory change, speech change, focal weakness and weakness.  Endo/Heme/Allergies: Does not bruise/bleed easily.  Psychiatric/Behavioral: Negative for substance abuse. The patient is not nervous/anxious.   All other systems reviewed and are negative.    Physical Exam:  Blood pressure 90/70, pulse 91, height 5\' 5"  (1.651 m), weight 220 lb (99.791 kg). BMI: Body mass index is 36.61 kg/(m^2). General: Pleasant, NAD. Psych: Normal affect. Responds to questions with normal affect.  Neuro: Phelps and oriented X 3. Moves all extremities spontaneously. HEENT: Normocephalic, atraumatic. EOM intact. Sclera anicteric.  Neck: Trachea midline. Supple without bruits or  JVD. Lungs:  Respirations regular and unlabored. CTA bilaterally without wheezing, crackles, or rhonchi.  Heart: RRR, normal s3, s4. No murmurs, rubs, or gallops.  Abdomen: Obese, soft, non-tender, non-distended, BS + x 4.  Extremities: No clubbing, cyanosis or edema. DP/PT/Radials 2+ and equal bilaterally.   Accessory Clinical Findings:  EKG: NSR, 91 bpm, poor R wave progressio, occasional PVC, low voltage, TWI III  Recent Labs: No results found for requested labs within last 365 days.  No results found for requested labs within last 365 days.  CrCl cannot be calculated (Patient has no serum creatinine result on file.).  Weights: Wt Readings from Last 3 Encounters:  03/31/15 220 lb (99.791 kg)  01/06/15 215 lb 8 oz (97.75 kg)  12/31/13 226 lb (102.513 kg)    Other studies Reviewed: Additional studies/ records that were reviewed today include: prior notes.  Assessment & Plan:  1. Atypical chest pain:  -History of nonobstructive CAD by cath in 2008 -Schedule [place], unable to acheive target HR with ETT previously  -Schedule echo to evaluate LV function and to evaluate  mitral regurgitation  -Symptoms non-exertional  -No recent stressors -Continue aspirin 81 mg daily and Lipitor 40 mg daily -Symptoms appear to be atypical and likely could be MSK, GI, or pulmonary in etiology. If the above work up is unremarkable would advise the patient to pursue evaluation of the above   2. COPD:  -She is dealing with increased congestion currently -PCP does not feel like antibiotic is indicated at this time -Continue treatment as directed by PCP -Above symptoms could be 2/2 COPD exacerbation   3. HTN:  -BP is soft today -Will decrease her Micardis to 20 mg daily -Continue to monitor  4. HLD:  -Lipitor 40 mg daily  5. Diastolic dysfunction:  -Check echo as above  6. History of mild MR: -Check echo  7. Dispo:  -Check lipid and cmet -Follow up with Dr. Rockey Situ, MD in 6  months   Current medicines are reviewed at length with the patient today.  The patient did not have any concerns regarding medicines.   Christell Faith, PA-C [place], [place] [address] [phone] [facility] 03/31/2015, 2:07 PM

## 2015-04-01 LAB — LIPID PANEL
CHOL/HDL RATIO: 3.4 ratio (ref 0.0–4.4)
CHOLESTEROL TOTAL: 155 mg/dL (ref 100–199)
HDL: 45 mg/dL (ref >39)
LDL CALC: 67 mg/dL (ref 0–99)
Triglycerides: 217 mg/dL — ABNORMAL HIGH (ref 0–149)
VLDL CHOLESTEROL CAL: 43 mg/dL — AB (ref 5–40)

## 2015-04-01 LAB — COMPREHENSIVE METABOLIC PANEL
A/G RATIO: 1.6 (ref 1.1–2.5)
ALK PHOS: 87 IU/L (ref 39–117)
ALT: 31 IU/L (ref 0–32)
AST: 23 IU/L (ref 0–40)
Albumin: 4.4 g/dL (ref 3.5–4.8)
BUN / CREAT RATIO: 20 (ref 11–26)
BUN: 19 mg/dL (ref 8–27)
Bilirubin Total: 0.3 mg/dL (ref 0.0–1.2)
CALCIUM: 9.5 mg/dL (ref 8.7–10.3)
CHLORIDE: 99 mmol/L (ref 97–108)
CO2: 19 mmol/L (ref 18–29)
Creatinine, Ser: 0.93 mg/dL (ref 0.57–1.00)
GFR calc non Af Amer: 62 mL/min/{1.73_m2} (ref >59)
GFR, EST AFRICAN AMERICAN: 72 mL/min/{1.73_m2} (ref >59)
GLOBULIN, TOTAL: 2.7 g/dL (ref 1.5–4.5)
GLUCOSE: 118 mg/dL — AB (ref 65–99)
Potassium: 4.2 mmol/L (ref 3.5–5.2)
Sodium: 140 mmol/L (ref 134–144)
Total Protein: 7.1 g/dL (ref 6.0–8.5)

## 2015-04-05 ENCOUNTER — Telehealth: Payer: Self-pay | Admitting: *Deleted

## 2015-04-05 NOTE — Telephone Encounter (Signed)
Pt had a family emergency and is leaving town tonight, won't be back until aug 7th needs to rch stress test that is to be done tmw at hosptial.  Please call patient.

## 2015-04-05 NOTE — Telephone Encounter (Signed)
Cancelled myoview scheduled for 7/20 per phone note from patient. Spoke with Santiago Glad in scheduling who confirms it is cancelled in the system.

## 2015-04-07 ENCOUNTER — Encounter: Payer: Self-pay | Admitting: Family Medicine

## 2015-04-08 ENCOUNTER — Other Ambulatory Visit: Payer: Medicare Other

## 2015-04-25 ENCOUNTER — Telehealth: Payer: Self-pay

## 2015-04-25 NOTE — Telephone Encounter (Signed)
Left message on machine for patient to contact the office to reschedule stress test.

## 2015-04-29 ENCOUNTER — Other Ambulatory Visit: Payer: Medicare Other

## 2015-05-10 ENCOUNTER — Telehealth: Payer: Self-pay

## 2015-05-10 NOTE — Telephone Encounter (Signed)
S/w pt regarding rescheduling July myoview. Pt asks to call us back to reschedule as she had another family emergency and is unable to commit to a date. Asks to cancel 9/9 echo as well. Confirmed 9/30 appt with Dr. Rockey Situ and need to have tests before this appt. Pt verbalized understanding and states she will call us back when she is able to schedule.

## 2015-05-27 ENCOUNTER — Other Ambulatory Visit: Payer: Medicare Other

## 2015-06-17 ENCOUNTER — Encounter: Payer: Self-pay | Admitting: Cardiovascular Disease

## 2015-06-17 ENCOUNTER — Ambulatory Visit (INDEPENDENT_AMBULATORY_CARE_PROVIDER_SITE_OTHER): Payer: Medicare Other | Admitting: Cardiovascular Disease

## 2015-06-17 ENCOUNTER — Other Ambulatory Visit: Payer: Self-pay

## 2015-06-17 VITALS — BP 120/80 | HR 89 | Ht 65.0 in | Wt 221.2 lb

## 2015-06-17 DIAGNOSIS — J439 Emphysema, unspecified: Secondary | ICD-10-CM | POA: Diagnosis not present

## 2015-06-17 DIAGNOSIS — I10 Essential (primary) hypertension: Secondary | ICD-10-CM | POA: Diagnosis not present

## 2015-06-17 DIAGNOSIS — R0789 Other chest pain: Secondary | ICD-10-CM | POA: Diagnosis not present

## 2015-06-17 DIAGNOSIS — Z87891 Personal history of nicotine dependence: Secondary | ICD-10-CM

## 2015-06-17 DIAGNOSIS — E785 Hyperlipidemia, unspecified: Secondary | ICD-10-CM

## 2015-06-17 DIAGNOSIS — I2510 Atherosclerotic heart disease of native coronary artery without angina pectoris: Secondary | ICD-10-CM | POA: Diagnosis not present

## 2015-06-17 DIAGNOSIS — R0602 Shortness of breath: Secondary | ICD-10-CM

## 2015-06-17 MED ORDER — TELMISARTAN 20 MG PO TABS: 20.0000 mg | ORAL_TABLET | Freq: Every day | ORAL | Status: DC

## 2015-06-17 NOTE — Assessment & Plan Note (Signed)
Currently with no symptoms of angina. No further workup at this time. Continue current medication regimen. She has declined workup in the past

## 2015-06-17 NOTE — Assessment & Plan Note (Signed)
Blood pressure is well controlled on today's visit. No changes made to the medications. 

## 2015-06-17 NOTE — Assessment & Plan Note (Signed)
Encouraged her to stay on her inhalers including Advair, steroidal inhaler Significant cough and wheezing on today's visit

## 2015-06-17 NOTE — Assessment & Plan Note (Signed)
Encouraged her to stay on her Lipitor, goal LDL less than 70 

## 2015-06-17 NOTE — Assessment & Plan Note (Signed)
She reports that she has stopped smoking. Still very wheezy Underlying COPD

## 2015-06-17 NOTE — Assessment & Plan Note (Signed)
Mild symptoms of shortness of breath, chronic issue Underlying lung disease If symptoms get worse, would refer to pulmonary

## 2015-06-17 NOTE — Assessment & Plan Note (Signed)
She continues to have rare episodes of atypical chest pain. No further workup at this time. None recently. Unable to exclude GERD versus musculoskeletal Discussed various options for her when she has symptoms including taking Pepcid, tums for GERD, NSAIDs for musculoskeletal discomfort

## 2015-06-17 NOTE — Progress Notes (Signed)
Patient ID: Kellie Phelps, female    DOB: [birth year], 69 y.o.   MRN: [id number]  HPI Comments: [name] is a very pleasant 69 year old woman with history of hyperlipidemia , borderline elevated glucose levels, hypertension , GERD who presents for routine follow-up for her chest pain. Prior cardiac cath is a patient in 2008 with no significant disease  In follow-up, she reports that she is doing well. She was seen 03/29/2015 by Kellie Phelps for chest pain felt to be atypical in nature, possibly musculoskeletal. No further chest pain since that time. Echocardiogram is ordered but she did not feel that she needed this and did not complete the echo. In general she reports that she is doing well.  She does report having a chronic cough, shortness of breath with activity, muscle pain, some wheezing. She reports having a diagnosis of COPD but did not smoke very much Also has symptoms of GERD for which she takes a PPI. She does have breakthrough GERD symptoms  Other past medical history Seen in the past by Dr. Percival Phelps  For chest pain.  She had an episode while in [place] probably brought on emotional stress  pain was under her left breast,  sharp.  10 out of 10. some nausea and apparently she had vomiting.  was recommended that she undergo a stress test but this was not performed   2015 was seen in theemergency room in [place]  For chest pain, where she was hypokalemic otherwise enzymes were negative.They wanted to keep her overnight but she left AGAINST MEDICAL ADVICE.   Allergies  Allergen Reactions  . Penicillins Hives    Outpatient Encounter Prescriptions as of 06/17/2015  Medication Sig  . albuterol (PROAIR HFA) 108 (90 BASE) MCG/ACT inhaler Inhale 2 puffs into the lungs every 6 (six) hours as needed. Wheezing or shortness of breath.  Marland Kitchen aspirin 81 MG tablet Take 81 mg by mouth daily.   Marland Kitchen atorvastatin (LIPITOR) 40 MG tablet Take 1 tablet (40 mg total) by mouth daily.   Marland Kitchen dexlansoprazole (DEXILANT) 60 MG capsule Take 1 capsule (60 mg total) by mouth daily.  . Fluticasone-Salmeterol (ADVAIR DISKUS) 250-50 MCG/DOSE AEPB Inhale 1 puff into the lungs as needed.   . Multiple Vitamins-Minerals (ONE-A-DAY WOMENS 50 PLUS PO) Take by mouth daily.  . Omega-3 Fatty Acids (FISH OIL) 1000 MG CAPS 1,000 mg. Take two tablets by mouth daily.   Marland Kitchen tiotropium (SPIRIVA) 18 MCG inhalation capsule Place 18 mcg into inhaler and inhale daily.    . [DISCONTINUED] telmisartan (MICARDIS) 20 MG tablet Take 1 tablet (20 mg total) by mouth daily.  . [DISCONTINUED] telmisartan (MICARDIS) 20 MG tablet Take 1 tablet (20 mg total) by mouth daily.   No facility-administered encounter medications on file as of 06/17/2015.    Past Medical History  Diagnosis Date  . Anxiety   . COPD (chronic obstructive pulmonary disease)   . GERD (gastroesophageal reflux disease)   . Hypertension   . Hyperlipidemia   . Coronary artery disease, non-occlusive     a. LHC 6/08 EF 55-60%, 20-30% proximal LAD, LIs CFX, LIs RCA (Kellie Phelps); b. ETT-myoview 11/10, 3' stopped due to fatigue and chest tightness, EF normal, probably normal perfusion images with minimal soft tissue attenuation  . Diastolic dysfunction     a. echo 2010: EF 60-65%, GR1DD, no AI, trivial MR, LA nl  . Atypical chest pain   . Hyperglycemia     Past Surgical History  Procedure Laterality Date  . Tonsillectomy  Social History  reports that she quit smoking about 36 years ago. She does not have any smokeless tobacco history on file. She reports that she does not drink alcohol or use illicit drugs.  Family History family history includes Coronary artery disease in her brother and other; Heart attack in her father; Hepatitis in her brother; Pulmonary embolism in her brother; Uterine cancer in her mother.   Review of Systems  Constitutional: Negative.   Respiratory: Negative.   Cardiovascular: Negative.   Gastrointestinal:  Negative.   Musculoskeletal: Negative.   Neurological: Negative.   Hematological: Negative.   Psychiatric/Behavioral: Negative.   All other systems reviewed and are negative.   BP 120/80 mmHg  Pulse 89  Ht 5\' 5"  (1.651 m)  Wt 221 lb 4 oz (100.358 kg)  BMI 36.82 kg/m2  Physical Exam  Constitutional: She is oriented to person, place, and time. She appears well-developed and well-nourished.  Obese  HENT:  Head: Normocephalic.  Nose: Nose normal.  Mouth/Throat: Oropharynx is clear and moist.  Eyes: Conjunctivae are normal. Pupils are equal, round, and reactive to light.  Neck: Normal range of motion. Neck supple. No JVD present.  Cardiovascular: Normal rate, regular rhythm, S1 normal, S2 normal, normal heart sounds and intact distal pulses.  Exam reveals no gallop and no friction rub.   No murmur heard. Pulmonary/Chest: Effort normal and breath sounds normal. No respiratory distress. She has no wheezes. She has no rales. She exhibits no tenderness.  Abdominal: Soft. Bowel sounds are normal. She exhibits no distension. There is no tenderness.  Musculoskeletal: Normal range of motion. She exhibits no edema or tenderness.  Lymphadenopathy:    She has no cervical adenopathy.  Neurological: She is alert and oriented to person, place, and time. Coordination normal.  Skin: Skin is warm and dry. No rash noted. No erythema.  Psychiatric: She has a normal mood and affect. Her behavior is normal. Judgment and thought content normal.    Assessment and Plan  Nursing note and vitals reviewed.

## 2015-06-17 NOTE — Patient Instructions (Signed)
You are doing well. No medication changes were made.  For breakthrough GERD (heartburn) Take couple tums, also take pepcid/zantac (take 2 )  Please call us if you have new issues that need to be addressed before your next appt.  Your physician wants you to follow-up in: 12 months.  You will receive a reminder letter in the mail two months in advance. If you don't receive a letter, please call our office to schedule the follow-up appointment.

## 2016-01-24 ENCOUNTER — Telehealth: Payer: Self-pay | Admitting: Cardiovascular Disease

## 2016-01-24 NOTE — Telephone Encounter (Signed)
Pt calling stating she is having some Discomfort under her left breast  Its been coming and going for about a week States she is not sure if its her COPD or Bronchitis She states she has not taken any Nitro for it gives her bad headaches  Pt would like to know if she needs to go ED or something else.  Please advise.

## 2016-01-24 NOTE — Telephone Encounter (Signed)
Spoke w/ pt.  She reports a tightness and burning sensation behind her breast and in her throat that is worse after eating and lying down. She feels that the breast pain is actually in her breast tissue. She is more concerned about reflux and would like to come in to see if this is indeed the cause of her discomfort.  Pt added to wait list in the event of a cancellation, but advised her to contact her PCP to discuss GERD and breast pain.  She is agreeable and will call back if we can be of further assistance.

## 2016-05-29 ENCOUNTER — Ambulatory Visit: Payer: Medicare Other | Admitting: Pulmonary Disease

## 2016-06-04 ENCOUNTER — Encounter: Payer: Self-pay | Admitting: Pulmonary Disease

## 2016-06-04 ENCOUNTER — Ambulatory Visit (INDEPENDENT_AMBULATORY_CARE_PROVIDER_SITE_OTHER): Payer: Medicare Other | Admitting: Pulmonary Disease

## 2016-06-04 VITALS — BP 134/80 | HR 90 | Ht 65.0 in | Wt 220.6 lb

## 2016-06-04 DIAGNOSIS — R0600 Dyspnea, unspecified: Secondary | ICD-10-CM

## 2016-06-04 DIAGNOSIS — Z87891 Personal history of nicotine dependence: Secondary | ICD-10-CM

## 2016-06-04 DIAGNOSIS — J449 Chronic obstructive pulmonary disease, unspecified: Secondary | ICD-10-CM | POA: Diagnosis not present

## 2016-06-04 NOTE — Progress Notes (Signed)
PULMONARY CONSULT NOTE  Requesting MD/Service: Sabino Snipes, MD Date of initial consultation: 06/04/16 Reason for consultation: COPD  PT PROFILE: 71 F with minimal remote smoking history and reported diagnosis of COPD (no prior PFTs) self referred to "check on my breathing"   HPI:  75 F with minimal remote smoking history and no significant occupational exposures who carries an diagnosis of COPD but is unable to remember who rendered this diagnosis. The diagnosis was given to her sometime in the last 5-10 yrs. She has never seen a pulmonologist and does not recall ever having PFTs done. She does not identify an primary care physician but is followed by Cardiology for nonobstructive coronary disease. She is maintained on Spiriva. She also uses Advair infrequently at night "when she thinks she needs it". She has an albuterol inhaler which she uses infrequently and has recently obtained a nebulizer and has apparently been prescribed Duoneb recently. She cannot explain exactly why this has been prescribed. She has occasional nonproductive cough but denies purulent sputum, hemoptysis, chest pain, LE edema, orthopnea and and PND. She is quite sedentary but seems to describe class I/II dyspnea.   Past Medical History:  Diagnosis Date  . Anxiety   . Atypical chest pain   . COPD (chronic obstructive pulmonary disease) ([place])   . Coronary artery disease, non-occlusive    a. LHC 6/08 EF 55-60%, 20-30% proximal LAD, LIs CFX, LIs RCA (Har[facility]ani); b. ETT-myoview 11/10, 3' stopped due to fatigue and chest tightness, EF normal, probably normal perfusion images with minimal soft tissue attenuation  . Diastolic dysfunction    a. echo 2010: EF 60-65%, GR1DD, no AI, trivial MR, LA nl  . GERD (gastroesophageal reflux disease)   . Hyperglycemia   . Hyperlipidemia   . Hypertension     Past Surgical History:  Procedure Laterality Date  . TONSILLECTOMY      MEDICATIONS: I have reviewed all medications and  confirmed regimen as documented  Social History   Social History  . Marital status: Single    Spouse name: N/A  . Number of children: N/A  . Years of education: N/A   Occupational History  . Retired Retired   Social History Main Topics  . Smoking status: Former Smoker    Packs/day: 0.00    Quit date: 09/17/1978  . Smokeless tobacco: Never Used  . Alcohol use No  . Drug use: No  . Sexual activity: Not on file   Other Topics Concern  . Not on file   Social History Narrative   Divorced   Lives alone   No children   Does not get regular exercise    Family History  Problem Relation Age of Onset  . Uterine cancer Mother   . Heart attack Father   . Pulmonary embolism Brother   . Hepatitis Brother   . Coronary artery disease Brother   . Coronary artery disease Other     Aunt    ROS: No fever, myalgias/arthralgias, unexplained weight loss or weight gain No new focal weakness or sensory deficits No otalgia, hearing loss, visual changes, nasal and sinus symptoms, mouth and throat problems No neck pain or adenopathy No abdominal pain, N/V/D, diarrhea, change in bowel pattern No dysuria, change in urinary pattern   Vitals:   06/04/16 0938  BP: 134/80  Pulse: 90  SpO2: 95%  Weight: 220 lb 9.6 oz (100.1 kg)  Height: 5\' 5"  (1.651 m)     EXAM:  Gen: WDWN, No overt respiratory distress HEENT:  NCAT, sclera white, oropharynx normal Neck: Supple without LAN, thyromegaly, JVD Lungs: breath sounds: full, percussion: normal, No adventitious sounds Cardiovascular: RRR, no murmurs noted Abdomen: Soft, nontender, normal BS Ext: without clubbing, cyanosis, edema Neuro: CNs grossly intact, motor and sensory intact Skin: Limited exam, no lesions noted  DATA:   BMP Latest Ref Rng & Units 03/31/2015 09/16/2013 08/19/2010  Glucose 65 - 99 mg/dL 118(H) 106(H) 128(H)  BUN 8 - 27 mg/dL 19 20 9   Creatinine 0.57 - 1.00 mg/dL 0.93 1.00 0.7  BUN/Creat Ratio 11 - 26 20 - -  Sodium  134 - 144 mmol/L 140 136(L) 139  Potassium 3.5 - 5.2 mmol/L 4.2 4.5 3.8  Chloride 97 - 108 mmol/L 99 105 106  CO2 18 - 29 mmol/L 19 - -  Calcium 8.7 - 10.3 mg/dL 9.5 - -    CBC Latest Ref Rng & Units 09/16/2013 09/16/2013 08/19/2010  WBC 4.0 - 10.5 K/uL - 8.3 -  Hemoglobin 12.0 - 15.0 g/dL 15.6(H) 15.3(H) 15.3(H)  Hematocrit 36.0 - 46.0 % 46.0 43.6 45.0  Platelets 150 - 400 K/uL - 213 -    CXR:  No recent films available  IMPRESSION:     ICD-9-CM ICD-10-CM   1. COPD, suspect mild  496 J44.9   2. Former smoker V15.82 Z87.891   3. Dyspnea 786.09 R06.00      PLAN:  1) continue Spiriva daily 2) Continue Advair @ night as needed 3) Continue Pro-air a needed 4) If Pro-air doesn't provide relief, use the nebulized albuterol 5) If the nebulizer doesn't relieve shortness of breath, instructed to call an MD or be seen in an Urgent Care or ED 6) Follow up in 3 months with PFTs   Merton Border, MD PCCM service Mobile [phone] Pager [phone] 06/04/2016

## 2016-06-04 NOTE — Patient Instructions (Signed)
1) continue Spiriva daily 2) Continue Advair @ night as needed 3) Continue Pro-air a needed 4) If Pro-air doesn't give you relief, that's the time to use the nebulizer 5) If the nebulizer doesn't relieve your shortness of breath, you need to call an MD or be seen in an Urgent Care or ED 6) Follow up in 3 months and we will get lung function measurementsat that time

## 2016-06-08 ENCOUNTER — Ambulatory Visit (INDEPENDENT_AMBULATORY_CARE_PROVIDER_SITE_OTHER): Payer: Medicare Other | Admitting: Podiatry

## 2016-06-08 ENCOUNTER — Ambulatory Visit (INDEPENDENT_AMBULATORY_CARE_PROVIDER_SITE_OTHER): Payer: Medicare Other

## 2016-06-08 DIAGNOSIS — M79671 Pain in right foot: Secondary | ICD-10-CM

## 2016-06-08 DIAGNOSIS — M79672 Pain in left foot: Secondary | ICD-10-CM

## 2016-06-08 DIAGNOSIS — M76829 Posterior tibial tendinitis, unspecified leg: Secondary | ICD-10-CM

## 2016-06-08 DIAGNOSIS — M722 Plantar fascial fibromatosis: Secondary | ICD-10-CM

## 2016-06-08 DIAGNOSIS — M25579 Pain in unspecified ankle and joints of unspecified foot: Secondary | ICD-10-CM | POA: Diagnosis not present

## 2016-06-08 DIAGNOSIS — M6789 Other specified disorders of synovium and tendon, multiple sites: Secondary | ICD-10-CM

## 2016-06-08 NOTE — Patient Instructions (Signed)
Posterior Tibial Tendon Rupture With Rehab Tendons are soft tissues that connect muscle to bone. Tendons allow muscles to move the skeletal system. A complete tear of the posterior tibial tendon is known as a posterior tendon rupture. The posterior tibial tendon attaches the posterior muscles on the inner portion of the back of the lower leg (tibialis muscles) to foot. The posterior tibialis muscles help straighten (plantar flex) and rotate the foot inward (medially rotate) the foot. A posterior tibial rupture will result in a decreased ability to perform these tasks. SYMPTOMS   A "pop" or tear felt and/or heard in the area at the time of injury.  Pain, tenderness, inflammation, and/or bruising (contusion) around the medial inner side of the ankle.  Pain that worsens with dorsiflexion (opposite of plantar flexion) of the foot.  Decreased ankle strength.  Decrease in the prominence of the arch in the sole of the foot. CAUSES  Tendon ruptures occur when a force is placed on the tendon that is greater than it can withstand. Common mechanisms of injury include:  An event that places great stress on the tendon (jumping or starting a sprint).  Direct trauma to the ankle. RISK INCREASES WITH:  Sports that involve forceful and explosive plantar flexion (jumping or quick starts).  Poor strength and flexibility.  Previous injury to the posterior tibial tendon.  Corticosteroid injection into the posterior tibial tendon.  Obesity.  Poor vascular circulation. PREVENTION   Warm up and stretch properly before activity.  Allow for adequate recovery between workouts.  Maintain physical fitness:  Strength, flexibility, and endurance.  Cardiovascular fitness.  Maintain a healthy body weight.  Arch supports (orthotics), if you have flat feet.  Limit ankle movement by taping, wearing a brace, or using compression bandages. PROGNOSIS  In order to have the highest likelihood of returning  to your pre-injury activity level, surgery is usually recommended, with 4 to 9 months of rehabilitation afterward.  RELATED COMPLICATIONS   Decreased ability to plantar flex.  Rerupture of the posterior tibial tendon.  Prolonged disability.  Flat feet.  Arthritis of the foot.  Risks of surgery: infection, bleeding, nerve damage, or damage to surrounding tissues. TREATMENT  Treatment initially involves resting from any activities that aggravate your symptoms. Ice, medication, and elevation can be used to help reduce pain and inflammation. In order to have the best results, surgery is recommended. Surgery involves sewing (suturing) the ends of the torn tendon back together. If your surgeon cannot repair the tendon, then a replacement (reconstruction) surgery may be performed to use another tendon to replace the function of the ruptured tendon. After surgery, the ankle is immobilized in order to allow the tendon to heal. After immobilization, it is important to perform strengthening and stretching exercises to help regain strength and a full range of motion. These exercises may be completed at home or with a therapist. MEDICATION   Nonsteroidal anti-inflammatory medications, such as aspirin and ibuprofen (do not take within 7 days before surgery), or other minor pain relievers, such as acetaminophen, are often recommended. Take these as directed by your caregiver. Contact your caregiver immediately if any bleeding, stomach upset, or signs of an allergic reaction occur.  Pain relievers may be prescribed as necessary by your caregiver. Use only as directed and only as much as you need. SEEK MEDICAL CARE IF:  Treatment seems to offer no benefit, or the condition worsens.  Any medications produce adverse side effects.  Any complications from surgery occur:  Pain, numbness, or  coldness in the extremity operated upon.  Discoloration of the nail beds (they become blue or gray) of the extremity  operated upon.  Signs of infections (fever, pain, inflammation, redness, or persistent bleeding). EXERCISES RANGE OF MOTION (ROM) AND STRETCHING EXERCISES - Posterior Tibial Tendon Rupture These exercises may help you when beginning to rehabilitate your injury. Your symptoms may resolve with or without further involvement from your physician, physical therapist, or athletic trainer. While completing these exercises, remember:   Restoring tissue flexibility helps normal motion to return to the joints. This allows healthier, less painful movement and activity.  An effective stretch should be held for at least 30 seconds.  A stretch should never be painful. You should only feel a gentle lengthening or release in the stretched tissue. STRETCH - Gastrocsoleus   Sit with your right / left leg extended. Holding onto both ends of a belt or towel, loop it around the ball of your foot.  Keeping your right / left ankle and foot relaxed and your knee straight, pull your foot and ankle toward you using the belt/towel.  You should feel a gentle stretch behind your calf or knee. Hold this position for __________ seconds. Repeat __________ times. Complete this stretch __________. times per day.  RANGE OF MOTION - Dorsi/Plantar Flexion  While sitting with your right / left knee straight, draw the top of your foot upwards by flexing your ankle. Then reverse the motion, pointing your toes downward.  Hold each position for __________ seconds.  After completing your first set of exercises, repeat this exercise with your knee bent. Repeat __________ times. Complete this exercise __________ times per day.  RANGE OF MOTION - Ankle Plantar Flexion   Sit with your right / left leg crossed over your opposite knee.  Use your opposite hand to pull the top of your foot and toes toward you.  You should feel a gentle stretch on the top of your foot/ankle. Hold this position for __________ seconds. Repeat  __________ times. Complete __________ times per day.  RANGE OF MOTION - Ankle Eversion   Sit with your right / left ankle crossed over your opposite knee.  Grip your foot with your opposite hand, placing your thumb on the top of your foot and your fingers across the bottom of your foot.  Gently push your foot downward with a slight rotation so your littlest toes rise slightly.  You should feel a gentle stretch on the inside of your ankle. Hold the stretch for __________ seconds. Repeat __________ times. Complete this exercise __________ times per day.  RANGE OF MOTION - Ankle Inversion   Sit with your right / left ankle crossed over your opposite knee.  Grip your foot with your opposite hand, placing your thumb on the bottom of your foot and your fingers across the top of your foot.  Gently pull your foot so the smallest toe comes toward you and your thumb pushes the inside of the ball of your foot away from you.  You should feel a gentle stretch on the outside of your ankle. Hold the stretch for __________ seconds. Repeat __________ times. Complete this exercise __________ times per day.  RANGE OF MOTION - Ankle Alphabet  Imagine your right / left big toe is a pen.  Keeping your hip and knee still, write out the entire alphabet with your "pen." Make the letters as large as you can without increasing any discomfort. Repeat __________ times. Complete this exercise __________ times per day.  STRENGTHENING EXERCISES - Posterior Tibial Tendon Rupture These exercises may help you when beginning to rehabilitate your injury. They may resolve your symptoms with or without further involvement from your physician, physical therapist, or athletic trainer. While completing these exercises, remember:   Muscles can gain both the endurance and the strength needed for everyday activities through controlled exercises.  Complete these exercises as instructed by your physician, physical therapist, or  athletic trainer. Progress the resistance and repetitions only as guided. STRENGTH - Dorsiflexors  Secure a rubber exercise band/tubing to a fixed object (table, pole) and loop the other end around your right / left foot.  Sit on the floor facing the fixed object. The band/tubing should be slightly tense when your foot is relaxed.  Slowly draw your foot back toward you using your ankle and toes.  Hold this position for __________ seconds. Slowly release the tension in the band and return your foot to the starting position. Repeat __________ times. Complete this exercise __________ times per day.  STRENGTH - Plantar Flexors   Sit with your right / left leg extended. Holding onto both ends of a rubber exercise band/tubing, loop it around the ball of your foot. Keep a slight tension in the band.  Slowly push your toes away from you, pointing them downward.  Hold this position for __________ seconds. Return slowly, controlling the tension in the band/tubing. Repeat __________ times. Complete this exercise __________ times per day.  STRENGTH - Towel Curls  Sit in a chair positioned on a non-carpeted surface.  Place your foot on a towel, keeping your heel on the floor.  Pull the towel toward your heel by only curling your toes. Keep your heel on the floor.  If instructed by your physician, physical therapist or athletic trainer, add ____________________ at the end of the towel. Repeat __________ times. Complete this exercise __________ times per day. STRENGTH - Ankle Inversion   Secure one end of a rubber exercise band/tubing to a fixed object (table, pole). Loop the other end around your foot just before your toes.  Place your fists between your knees. This will focus your strengthening at your ankle.  Slowly, pull your big toe up and in, making sure the band/tubing is positioned to resist the entire motion.  Hold this position for __________ seconds.  Have your muscles resist the  band/tubing as it slowly pulls your foot back to the starting position. Repeat __________ times. Complete this exercise __________ times per day.    This information is not intended to replace advice given to you by your health care provider. Make sure you discuss any questions you have with your health care provider.   Document Released: 09/03/2005 Document Revised: 05/25/2015 Document Reviewed: 12/16/2008 Elsevier Interactive Patient Education [employer].

## 2016-06-08 NOTE — Progress Notes (Signed)
   Subjective:    Patient ID: Kellie Phelps, female    DOB: 15-Sep-1946, 70 y.o.   MRN: [id number]  HPI    Review of Systems  Eyes: Positive for redness and visual disturbance.  Respiratory: Positive for cough, chest tightness, shortness of breath and wheezing.   Gastrointestinal: Positive for constipation.  Musculoskeletal: Positive for back pain.       Objective:   Physical Exam        Assessment & Plan:

## 2016-06-11 NOTE — Progress Notes (Signed)
Patient ID: Kellie Phelps, female   DOB: [birth year], 70 y.o.   MRN: [id number] Subjective: Patient presents with pain on the bilateral bilateral heels for 1 month history. She states that her feet and ankles have been hurting lately. Patient states that she is more symptomatic on the right foot. Patient presents today for further treatment and evaluation  Objective: Physical Exam General: The patient is alert and oriented x3 in no acute distress.  Dermatology: Skin is warm, dry and supple bilateral lower extremities. Negative for open lesions or macerations bilateral.   Vascular: Dorsalis Pedis and Posterior Tibial pulses palpable bilateral.  Capillary fill time is immediate to all digits.  Neurological: Epicritic and protective threshold intact bilateral.   Musculoskeletal: Tenderness to palpation at the medial calcaneal tubercale and through the insertion of the plantar fascia of the bilateral feet.  Pain on palpation also noted to the posterior tibial tendon bilateral ankles with medial arch collapse.  All other joints range of motion within normal limits bilateral. Strength 5/5 in all groups bilateral.   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 No fracture/dislocation/boney destruction. Calcaneal spur present with mild thickening of plantar fascia bilateral. No other soft tissue abnormalities or radiopaque foreign bodies.   Assessment: #1 plantar fasciitis bilateral feet #2 posterior tibial tendon dysfunction bilateral #3 pain in bilateral feet #4 pain in bilateral ankles #5 pes planus deformity bilateral  Problem List Items Addressed This Visit    None    Visit Diagnoses    Foot pain, bilateral    -  Primary   Relevant Orders   DG Foot Complete Left   DG Foot Complete Right       Plan of Care:   1. Patient evaluated. Xrays reviewed.   2. Today the patient refused steroid all  injections. She states that on her next visit she will bring her niece for the steroidal anti-inflammatory injections.  3. Instructed patient regarding therapies and modalities at home to alleviate symptoms.  4. Plantar fascial band(s) dispensed for bilateral PTTD and plantar fasciitis bilaterally. 6. Return to clinic in 4 weeks.

## 2016-06-18 ENCOUNTER — Encounter: Payer: Self-pay | Admitting: Cardiovascular Disease

## 2016-06-18 ENCOUNTER — Ambulatory Visit (INDEPENDENT_AMBULATORY_CARE_PROVIDER_SITE_OTHER): Payer: Medicare Other | Admitting: Cardiovascular Disease

## 2016-06-18 VITALS — BP 120/70 | HR 75 | Ht 65.0 in | Wt 222.5 lb

## 2016-06-18 DIAGNOSIS — E782 Mixed hyperlipidemia: Secondary | ICD-10-CM

## 2016-06-18 DIAGNOSIS — R0789 Other chest pain: Secondary | ICD-10-CM

## 2016-06-18 DIAGNOSIS — I2510 Atherosclerotic heart disease of native coronary artery without angina pectoris: Secondary | ICD-10-CM

## 2016-06-18 DIAGNOSIS — J439 Emphysema, unspecified: Secondary | ICD-10-CM

## 2016-06-18 DIAGNOSIS — I10 Essential (primary) hypertension: Secondary | ICD-10-CM

## 2016-06-18 NOTE — Progress Notes (Signed)
Cardiology Office Note  Date:  06/18/2016   ID:  Kellie Phelps, DOB August 17, 1946, MRN [id number]  PCP:  Herminio Commons, MD   Chief Complaint  Patient presents with  . other    12 month follow up. "doing well."     HPI:  Kellie Phelps is a very pleasant 70 year old woman with history of hyperlipidemia , borderline elevated glucose levels, hypertension , GERD who presents for routine follow-up for her chest pain. Prior cardiac cath in 2008 with no significant disease  In follow-up today, she reports occasional pain up in her chest, presents typically at rest Bilateral, underneath her breasts Does not seem to come on with exertion Continues to take her inhalers Otherwise feels well with no complaints Denies having any recent blood work  Lab work reviewed from 2016 in our system Total chol 155 LDL 67   Other past medical history Prior history of chronic cough, shortness of breath with activity, muscle pain, some wheezing. She reports having a diagnosis of COPD but did not smoke very much  symptoms of GERD for which she takes a PPI.    episode of chest pain while in [place] probably brought on by emotional stress  pain was under her left breast,  sharp.  10 out of 10. some nausea and apparently she had vomiting.  was recommended that she undergo a stress test but this was not performed   2015 was seen in theemergency room in [place]  For chest pain, where she was hypokalemic otherwise enzymes were negative.They wanted to keep her overnight but she left AGAINST MEDICAL ADVICE.   PMH:   has a past medical history of Anxiety; Atypical chest pain; COPD (chronic obstructive pulmonary disease) ([place]); Coronary artery disease, non-occlusive; Diastolic dysfunction; GERD (gastroesophageal reflux disease); Hyperglycemia; Hyperlipidemia; and Hypertension.  PSH:    Past Surgical History:  Procedure Laterality Date  . TONSILLECTOMY      Current Outpatient  Prescriptions  Medication Sig Dispense Refill  . albuterol (PROAIR HFA) 108 (90 BASE) MCG/ACT inhaler Inhale 2 puffs into the lungs every 6 (six) hours as needed. Wheezing or shortness of breath.    Marland Kitchen aspirin 81 MG tablet Take 81 mg by mouth daily.     . Calcium Carbonate-Vitamin D (CALCIUM 600+D) 600-200 MG-UNIT TABS Take by mouth.    . dexlansoprazole (DEXILANT) 60 MG capsule Take 1 capsule (60 mg total) by mouth daily. 90 capsule 3  . Fluticasone-Salmeterol (ADVAIR DISKUS) 250-50 MCG/DOSE AEPB Inhale 1 puff into the lungs as needed.     Marland Kitchen ipratropium-albuterol (DUONEB) 0.5-2.5 (3) MG/3ML SOLN Take 3 mLs by nebulization.    . Multiple Vitamins-Minerals (ONE-A-DAY WOMENS 50 PLUS PO) Take by mouth daily.    . Omega-3 Fatty Acids (FISH OIL) 1000 MG CAPS 1,000 mg. Take two tablets by mouth daily.     . polyethylene glycol powder (GLYCOLAX/MIRALAX) powder     . simvastatin (ZOCOR) 40 MG tablet     . telmisartan-hydrochlorothiazide (MICARDIS HCT) 40-12.5 MG tablet     . tiotropium (SPIRIVA) 18 MCG inhalation capsule Place 18 mcg into inhaler and inhale daily.       No current facility-administered medications for this visit.      Allergies:   Penicillins   Social History:  The patient  reports that she quit smoking about 37 years ago. She smoked 0.00 packs per day. She has never used smokeless tobacco. She reports that she does not drink alcohol or use drugs.   Family  History:   family history includes Coronary artery disease in her brother and other; Heart attack in her father; Hepatitis in her brother; Pulmonary embolism in her brother; Uterine cancer in her mother.    Review of Systems: Review of Systems  Constitutional: Negative.   Respiratory: Negative.   Cardiovascular: Negative.        Rare chest tightness bilaterally underneath the breasts  Gastrointestinal: Negative.   Musculoskeletal: Negative.   Neurological: Negative.   Psychiatric/Behavioral: Negative.   All other systems  reviewed and are negative.    PHYSICAL EXAM: VS:  BP 120/70 (BP Location: Left Arm, Patient Position: Sitting, Cuff Size: Normal)   Pulse 75   Ht 5\' 5"  (1.651 m)   Wt 222 lb 8 oz (100.9 kg)   BMI 37.03 kg/m  , BMI Body mass index is 37.03 kg/m. GEN: Well nourished, well developed, in no acute distress, obese  HEENT: normal  Neck: no JVD, carotid bruits, or masses Cardiac: RRR; no murmurs, rubs, or gallops,no edema  Respiratory:  clear to auscultation bilaterally, normal work of breathing GI: soft, nontender, nondistended, + BS Kellie: no deformity or atrophy  Skin: warm and dry, no rash Neuro:  Strength and sensation are intact Psych: euthymic mood, full affect    Recent Labs: No results found for requested labs within last 8760 hours.    Lipid Panel Lab Results  Component Value Date   CHOL 155 03/31/2015   HDL 45 03/31/2015   LDLCALC 67 03/31/2015   TRIG 217 (H) 03/31/2015      Wt Readings from Last 3 Encounters:  06/18/16 222 lb 8 oz (100.9 kg)  06/04/16 220 lb 9.6 oz (100.1 kg)  06/17/15 221 lb 4 oz (100.4 kg)       ASSESSMENT AND PLAN:  Coronary artery disease, non-occlusive - Plan: EKG 12-Lead No significant coronary disease by prior catheterization  atypical chest pain symptoms on today's visit, rare No further workup at this time   Essential hypertension - Plan: EKG 12-Lead Blood pressure is well controlled on today's visit. No changes made to the medications. Recommended she talk with primary care to have lab work done as she is on HCTZ, history of low potassium . We have discussed various dietary sources of potassium, complications of low potassium   Mixed hyperlipidemia Cholesterol is at goal on the current lipid regimen, based on 2016 lab work. No changes to the medications were made.  Pulmonary emphysema, unspecified emphysema type ([place]) Currently on inhalers, reports stable   Atypical chest pain No further workup for her atypical chest pain  symptoms Long history of atypical symptoms, felt to be noncardiac   Total encounter time more than 25 minutes  Greater than 50% was spent in counseling and coordination of care with the patient  Disposition:   F/U  12 months   Orders Placed This Encounter  Procedures  . EKG 12-Lead     Signed, Esmond Plants, M.D., Ph.D. 06/18/2016  [place], [place]

## 2016-06-18 NOTE — Patient Instructions (Signed)

## 2016-06-29 ENCOUNTER — Encounter: Payer: Self-pay | Admitting: Cardiovascular Disease

## 2016-07-06 ENCOUNTER — Ambulatory Visit: Payer: Medicare Other | Admitting: Podiatry

## 2016-07-06 ENCOUNTER — Ambulatory Visit: Payer: Medicare Other | Admitting: Pulmonary Disease

## 2016-09-20 ENCOUNTER — Ambulatory Visit: Payer: Medicare Other | Admitting: Pulmonary Disease

## 2016-10-10 NOTE — Telephone Encounter (Signed)
This encounter was created in error - please disregard.

## 2016-10-16 ENCOUNTER — Other Ambulatory Visit: Payer: Self-pay | Admitting: Family Medicine

## 2016-10-16 DIAGNOSIS — Z1231 Encounter for screening mammogram for malignant neoplasm of breast: Secondary | ICD-10-CM

## 2016-11-01 ENCOUNTER — Ambulatory Visit: Payer: Medicare Other | Admitting: Pulmonary Disease

## 2016-11-01 ENCOUNTER — Ambulatory Visit: Payer: Medicare Other | Admitting: Internal Medicine

## 2016-11-01 ENCOUNTER — Encounter: Payer: Self-pay | Admitting: *Deleted

## 2016-11-07 ENCOUNTER — Telehealth: Payer: Self-pay | Admitting: Cardiovascular Disease

## 2016-11-07 NOTE — Telephone Encounter (Signed)
Spoke with patient and she states that she was having some sharp pains under her left breast and that she feels it is from heartburn. She stated that it does not last but a few seconds and then it goes away. She states that she gets this when eating anything spicy and she did have a burger last night and thinks that was the problem. She did want to come in though to discuss with Dr. Rockey Situ just to make sure it is nothing to be concerned about. Instructed her to go to emergency room if symptoms should worsen or persist and she verbalized understanding. Transferred her to scheduling for appointment. She verbalized understanding of our conversation, agreement with plan, and had no further questions at this time.

## 2016-11-07 NOTE — Telephone Encounter (Signed)
Pt states she has a "burning in my chest". She states that she thinks it is heartburn. States she takes medication for heartburn. This feeling happens when she eats certain things, spicy, acidicy, etc. Denies any SOB, Dizziness Pt c/o of Chest Pain: STAT if CP now or developed within 24 hours  1. Are you having CP right now? no  2. Are you experiencing any other symptoms (ex. SOB, nausea, vomiting, sweating)? Hat "sharp pain" under breast   3. How long have you been experiencing CP? Sharp pain was just last night. Burning when she eats certain things.   4. Is your CP continuous or coming and going? Lasted for about 2 minutes, went away and then came back.  5. Have you taken Nitroglycerin? no ?

## 2016-11-20 ENCOUNTER — Ambulatory Visit: Payer: Medicare Other | Admitting: Cardiovascular Disease

## 2016-11-25 NOTE — Progress Notes (Deleted)
Cardiology Office Note  Date:  11/25/2016   ID:  Randi Poullard, DOB Jun 30, 1946, MRN [id number]  PCP:  Sabino Snipes KEY, MD   No chief complaint on file.   HPI:  Kellie Phelps is a very pleasant 71 year old woman with history of hyperlipidemia , borderline elevated glucose levels, hypertension , GERD who presents for routine follow-up for her chest pain. Prior cardiac cath in 2008 with no significant disease  In follow-up today, she reports occasional pain up in her chest, presents typically at rest Bilateral, underneath her breasts Does not seem to come on with exertion Continues to take her inhalers Otherwise feels well with no complaints Denies having any recent blood work  Lab work reviewed from 2016 in our system Total chol 155 LDL 67   Other past medical history Prior history of chronic cough, shortness of breath with activity, muscle pain, some wheezing. She reports having a diagnosis of COPD but did not smoke very much  symptoms of GERD for which she takes a PPI.    episode of chest pain while in [place] probably brought on by emotional stress pain was under her left breast, sharp. 10 out of 10. some nausea and apparently she had vomiting. was recommended that she undergo a stress test but this was not performed  2015 was seen in theemergency room in [place] For chest pain, where she was hypokalemic otherwise enzymes were negative.They wanted to keep her overnight but she left AGAINST MEDICAL ADVICE.  PMH:   has a past medical history of Anxiety; Atypical chest pain; COPD (chronic obstructive pulmonary disease) ([place]); Coronary artery disease, non-occlusive; Diastolic dysfunction; GERD (gastroesophageal reflux disease); Hyperglycemia; Hyperlipidemia; and Hypertension.  PSH:    Past Surgical History:  Procedure Laterality Date  . TONSILLECTOMY      Current Outpatient Prescriptions  Medication Sig Dispense Refill  . albuterol (PROAIR  HFA) 108 (90 BASE) MCG/ACT inhaler Inhale 2 puffs into the lungs every 6 (six) hours as needed. Wheezing or shortness of breath.    Marland Kitchen aspirin 81 MG tablet Take 81 mg by mouth daily.     . Calcium Carbonate-Vitamin D (CALCIUM 600+D) 600-200 MG-UNIT TABS Take by mouth.    . dexlansoprazole (DEXILANT) 60 MG capsule Take 1 capsule (60 mg total) by mouth daily. 90 capsule 3  . Fluticasone-Salmeterol (ADVAIR DISKUS) 250-50 MCG/DOSE AEPB Inhale 1 puff into the lungs as needed.     Marland Kitchen ipratropium-albuterol (DUONEB) 0.5-2.5 (3) MG/3ML SOLN Take 3 mLs by nebulization.    . Multiple Vitamins-Minerals (ONE-A-DAY WOMENS 50 PLUS PO) Take by mouth daily.    . Omega-3 Fatty Acids (FISH OIL) 1000 MG CAPS 1,000 mg. Take two tablets by mouth daily.     . polyethylene glycol powder (GLYCOLAX/MIRALAX) powder     . simvastatin (ZOCOR) 40 MG tablet     . telmisartan-hydrochlorothiazide (MICARDIS HCT) 40-12.5 MG tablet     . tiotropium (SPIRIVA) 18 MCG inhalation capsule Place 18 mcg into inhaler and inhale daily.       No current facility-administered medications for this visit.      Allergies:   Penicillins   Social History:  The patient  reports that she quit smoking about 38 years ago. She smoked 0.00 packs per day. She has never used smokeless tobacco. She reports that she does not drink alcohol or use drugs.   Family History:   family history includes Coronary artery disease in her brother and other; Heart attack in her father; Hepatitis in her  brother; Pulmonary embolism in her brother; Uterine cancer in her mother.    Review of Systems: ROS   PHYSICAL EXAM: VS:  There were no vitals taken for this visit. , BMI There is no height or weight on file to calculate BMI. GEN: Well nourished, well developed, in no acute distress HEENT: normal Neck: no JVD, carotid bruits, or masses Cardiac: RRR; no murmurs, rubs, or gallops,no edema  Respiratory:  clear to auscultation bilaterally, normal work of  breathing GI: soft, nontender, nondistended, + BS Kellie: no deformity or atrophy Skin: warm and dry, no rash Neuro:  Strength and sensation are intact Psych: euthymic mood, full affect    Recent Labs: No results found for requested labs within last 8760 hours.    Lipid Panel Lab Results  Component Value Date   CHOL 155 03/31/2015   HDL 45 03/31/2015   LDLCALC 67 03/31/2015   TRIG 217 (H) 03/31/2015      Wt Readings from Last 3 Encounters:  06/18/16 222 lb 8 oz (100.9 kg)  06/04/16 220 lb 9.6 oz (100.1 kg)  06/17/15 221 lb 4 oz (100.4 kg)       ASSESSMENT AND PLAN:  No diagnosis found.   Disposition:   F/U  6 months  No orders of the defined types were placed in this encounter.    Signed, Esmond Plants, M.D., Ph.D. 11/25/2016  [place], [place]

## 2016-11-26 ENCOUNTER — Ambulatory Visit: Payer: Medicare Other | Admitting: Cardiovascular Disease

## 2016-11-27 ENCOUNTER — Ambulatory Visit: Payer: Medicare Other

## 2016-12-06 ENCOUNTER — Ambulatory Visit: Payer: Medicare Other | Admitting: Cardiovascular Disease

## 2016-12-20 NOTE — Progress Notes (Deleted)
Cardiology Office Note  Date:  12/20/2016   ID:  Kellie Phelps, DOB 1945/11/30, MRN [id number]  PCP:  Sabino Snipes KEY, MD   No chief complaint on file.   HPI:  [name] is a very pleasant 71 year old woman with history of hyperlipidemia ,  Prior cardiac cath in 2008 with no significant disease borderline elevated glucose levels, hypertension ,  GERD  who presents for routine follow-up for her chest pain.   occasional pain up in her chest, presents typically at rest Bilateral, underneath her breasts Does not seem to come on with exertion Continues to take her inhalers Otherwise feels well with no complaints Denies having any recent blood work  Lab work 06/2016 Total chol 178 LDL 106   Other past medical history Prior history of chronic cough, shortness of breath with activity, muscle pain, some wheezing. She reports having a diagnosis of COPD but did not smoke very much  symptoms of GERD for which she takes a PPI.    episode of chest pain while in [place] probably brought on by emotional stress pain was under her left breast, sharp. 10 out of 10. some nausea and apparently she had vomiting. was recommended that she undergo a stress test but this was not performed  2015 was seen in theemergency room in [place] For chest pain, where she was hypokalemic otherwise enzymes were negative.They wanted to keep her overnight but she left AGAINST MEDICAL ADVICE.   PMH:   has a past medical history of Anxiety; Atypical chest pain; COPD (chronic obstructive pulmonary disease) ([place]); Coronary artery disease, non-occlusive; Diastolic dysfunction; GERD (gastroesophageal reflux disease); Hyperglycemia; Hyperlipidemia; and Hypertension.  PSH:    Past Surgical History:  Procedure Laterality Date  . TONSILLECTOMY      Current Outpatient Prescriptions  Medication Sig Dispense Refill  . albuterol (PROAIR HFA) 108 (90 BASE) MCG/ACT inhaler Inhale 2  puffs into the lungs every 6 (six) hours as needed. Wheezing or shortness of breath.    Marland Kitchen aspirin 81 MG tablet Take 81 mg by mouth daily.     . Calcium Carbonate-Vitamin D (CALCIUM 600+D) 600-200 MG-UNIT TABS Take by mouth.    . dexlansoprazole (DEXILANT) 60 MG capsule Take 1 capsule (60 mg total) by mouth daily. 90 capsule 3  . Fluticasone-Salmeterol (ADVAIR DISKUS) 250-50 MCG/DOSE AEPB Inhale 1 puff into the lungs as needed.     Marland Kitchen ipratropium-albuterol (DUONEB) 0.5-2.5 (3) MG/3ML SOLN Take 3 mLs by nebulization.    . Multiple Vitamins-Minerals (ONE-A-DAY WOMENS 50 PLUS PO) Take by mouth daily.    . Omega-3 Fatty Acids (FISH OIL) 1000 MG CAPS 1,000 mg. Take two tablets by mouth daily.     . polyethylene glycol powder (GLYCOLAX/MIRALAX) powder     . simvastatin (ZOCOR) 40 MG tablet     . telmisartan-hydrochlorothiazide (MICARDIS HCT) 40-12.5 MG tablet     . tiotropium (SPIRIVA) 18 MCG inhalation capsule Place 18 mcg into inhaler and inhale daily.       No current facility-administered medications for this visit.      Allergies:   Penicillins   Social History:  The patient  reports that she quit smoking about 38 years ago. She smoked 0.00 packs per day. She has never used smokeless tobacco. She reports that she does not drink alcohol or use drugs.   Family History:   family history includes Coronary artery disease in her brother and other; Heart attack in her father; Hepatitis in her brother; Pulmonary embolism in her  brother; Uterine cancer in her mother.    Review of Systems: ROS   PHYSICAL EXAM: VS:  There were no vitals taken for this visit. , BMI There is no height or weight on file to calculate BMI. GEN: Well nourished, well developed, in no acute distress HEENT: normal Neck: no JVD, carotid bruits, or masses Cardiac: RRR; no murmurs, rubs, or gallops,no edema  Respiratory:  clear to auscultation bilaterally, normal work of breathing GI: soft, nontender, nondistended, +  BS MS: no deformity or atrophy Skin: warm and dry, no rash Neuro:  Strength and sensation are intact Psych: euthymic mood, full affect    Recent Labs: No results found for requested labs within last 8760 hours.    Lipid Panel Lab Results  Component Value Date   CHOL 155 03/31/2015   HDL 45 03/31/2015   LDLCALC 67 03/31/2015   TRIG 217 (H) 03/31/2015      Wt Readings from Last 3 Encounters:  06/18/16 222 lb 8 oz (100.9 kg)  06/04/16 220 lb 9.6 oz (100.1 kg)  06/17/15 221 lb 4 oz (100.4 kg)       ASSESSMENT AND PLAN:  No diagnosis found.   Disposition:   F/U  6 months  No orders of the defined types were placed in this encounter.    Signed, Esmond Plants, M.D., Ph.D. 12/20/2016  [place], [place]

## 2016-12-21 ENCOUNTER — Ambulatory Visit: Payer: Medicare Other | Admitting: Cardiovascular Disease

## 2017-02-13 ENCOUNTER — Encounter: Payer: Self-pay | Admitting: Advanced Practice Midwife

## 2017-02-13 ENCOUNTER — Ambulatory Visit (INDEPENDENT_AMBULATORY_CARE_PROVIDER_SITE_OTHER): Payer: Medicare Other | Admitting: Advanced Practice Midwife

## 2017-02-13 VITALS — BP 100/62 | HR 94 | Ht 65.0 in | Wt 211.0 lb

## 2017-02-13 DIAGNOSIS — N760 Acute vaginitis: Secondary | ICD-10-CM | POA: Diagnosis not present

## 2017-02-13 DIAGNOSIS — Z124 Encounter for screening for malignant neoplasm of cervix: Secondary | ICD-10-CM | POA: Diagnosis not present

## 2017-02-13 DIAGNOSIS — Z01419 Encounter for gynecological examination (general) (routine) without abnormal findings: Secondary | ICD-10-CM

## 2017-02-13 DIAGNOSIS — Z13820 Encounter for screening for osteoporosis: Secondary | ICD-10-CM

## 2017-02-13 DIAGNOSIS — Z1231 Encounter for screening mammogram for malignant neoplasm of breast: Secondary | ICD-10-CM | POA: Diagnosis not present

## 2017-02-13 DIAGNOSIS — B9689 Other specified bacterial agents as the cause of diseases classified elsewhere: Secondary | ICD-10-CM | POA: Diagnosis not present

## 2017-02-13 MED ORDER — METRONIDAZOLE 500 MG PO TABS: 500.0000 mg | ORAL_TABLET | Freq: Two times a day (BID) | ORAL | 0 refills | Status: AC

## 2017-02-13 NOTE — Progress Notes (Addendum)
Patient ID: Kellie Phelps, female   DOB: 1945-10-28, 71 y.o.   MRN: [id number]      Gynecology Annual Exam  PCP: Herminio Commons, MD  Chief Complaint:  Chief Complaint  Patient presents with  . Gynecologic Exam    discharge/burning  . Gynecologic Exam    Pelvic pain    History of Present Illness:Patient is a 71 y.o. G0P0000 presents for annual exam. The patient has complaints today of vaginal discharge, odor, burning and pelvic pain. She states the symptoms began about a month ago. She was seen by another provider and given a prescription for diflucan for yeast infection. She states that helped a little but the symptoms came back.    LMP: No LMP recorded. Patient is postmenopausal.   The patient is not sexually active. She denies dyspareunia.  The patient does perform self breast exams.  There is notable family history of breast or ovarian cancer in her family. Patient states mother had ovarian cancer. Her past family history states mother had uterine cancer.  The patient wears seatbelts: yes.   The patient has regular exercise: limited to walking around the house.    The patient denies current symptoms of depression.     Review of Systems: Review of Systems  Constitutional: Negative.   HENT: Negative.   Eyes: Negative.   Respiratory: Negative.   Cardiovascular: Negative.   Gastrointestinal: Negative.   Genitourinary: Negative.   Musculoskeletal: Negative.   Skin: Negative.   Neurological: Negative.   Endo/Heme/Allergies: Negative.   Psychiatric/Behavioral: Negative.     Past Medical History:  Past Medical History:  Diagnosis Date  . Anxiety   . Atypical chest pain   . COPD (chronic obstructive pulmonary disease) ([place])   . Coronary artery disease, non-occlusive    a. LHC 6/08 EF 55-60%, 20-30% proximal LAD, LIs CFX, LIs RCA ([facility]); b. ETT-myoview 11/10, 3' stopped due to fatigue and chest tightness, EF normal, probably normal perfusion images  with minimal soft tissue attenuation  . Diastolic dysfunction    a. echo 2010: EF 60-65%, GR1DD, no AI, trivial MR, LA nl  . GERD (gastroesophageal reflux disease)   . Hyperglycemia   . Hyperlipidemia   . Hypertension     Past Surgical History:  Past Surgical History:  Procedure Laterality Date  . TONSILLECTOMY      Gynecologic History:  No LMP recorded. Patient is postmenopausal. Last Pap a number of years ago: Results were: no abnormalities  Last mammogram: a number of years ago and was normal Obstetric History: G0P0000  Family History:  Family History  Problem Relation Age of Onset  . Uterine cancer Mother   . Heart attack Father   . Pulmonary embolism Brother   . Hepatitis Brother   . Coronary artery disease Brother   . Coronary artery disease Other        Aunt    Social History:  Social History   Social History  . Marital status: Single    Spouse name: N/A  . Number of children: N/A  . Years of education: N/A   Occupational History  . Retired Retired   Social History Main Topics  . Smoking status: Former Smoker    Packs/day: 0.00    Quit date: 09/17/1978  . Smokeless tobacco: Never Used  . Alcohol use No  . Drug use: No  . Sexual activity: No   Other Topics Concern  . Not on file   Social History Narrative   Divorced  Lives alone   No children   Does not get regular exercise    Allergies:  Allergies  Allergen Reactions  . Penicillins Hives    Medications: Prior to Admission medications   Medication Sig Start Date End Date Taking? Authorizing Provider  albuterol (PROAIR HFA) 108 (90 BASE) MCG/ACT inhaler Inhale 2 puffs into the lungs every 6 (six) hours as needed. Wheezing or shortness of breath.   Yes [provider]  aspirin 81 MG tablet Take 81 mg by mouth daily.    Yes [provider]  Calcium Carbonate-Vitamin D (CALCIUM 600+D) 600-200 MG-UNIT TABS Take by mouth.   Yes [provider]  dexlansoprazole  (DEXILANT) 60 MG capsule Take 1 capsule (60 mg total) by mouth daily. 09/27/11  Yes Minus Breeding, MD  Fluticasone-Salmeterol (ADVAIR DISKUS) 250-50 MCG/DOSE AEPB Inhale 1 puff into the lungs as needed.    Yes [provider]  Multiple Vitamins-Minerals (ONE-A-DAY WOMENS 50 PLUS PO) Take by mouth daily.   Yes [provider]  Omega-3 Fatty Acids (FISH OIL) 1000 MG CAPS 1,000 mg. Take two tablets by mouth daily.    Yes [provider]  polyethylene glycol powder (GLYCOLAX/MIRALAX) powder  04/09/16  Yes [provider]  simvastatin (ZOCOR) 40 MG tablet  05/30/16  Yes [provider]  telmisartan-hydrochlorothiazide (MICARDIS HCT) 40-12.5 MG tablet  05/07/16  Yes [provider]  tiotropium (SPIRIVA) 18 MCG inhalation capsule Place 18 mcg into inhaler and inhale daily.     Yes [provider]  metroNIDAZOLE (FLAGYL) 500 MG tablet Take 1 tablet (500 mg total) by mouth 2 (two) times daily. 02/13/17 02/20/17  Rod Can, CNM    Physical Exam Vitals: Blood pressure 100/62, pulse 94, height 5\' 5"  (1.651 m), weight 211 lb (95.7 kg).  General: NAD HEENT: normocephalic, anicteric Thyroid: no enlargement, no palpable nodules Pulmonary: No increased work of breathing, CTAB Cardiovascular: RRR, distal pulses 2+ Breast: Breast symmetrical, no tenderness, no palpable nodules or masses, no skin or nipple retraction present, no nipple discharge.  No axillary or supraclavicular lymphadenopathy. Abdomen: NABS, soft, non-tender, non-distended.  Umbilicus without lesions.  No hepatomegaly, splenomegaly or masses palpable. No evidence of hernia  Genitourinary: Patient did not tolerate exam well. She was asked if she wanted me to remove the speculum but stated she wanted to finish the exam.   External: Normal external female genitalia.  Normal urethral meatus, normal  Bartholin's and Skene's glands.    Vagina: Normal vaginal mucosa, no evidence of  prolapse. Thick white discharge. Wet prep shows clue cells. Negative for yeast.    Cervix: Polyp at 8 o'clock, no bleeding, no CMT  Uterus: Non-enlarged, mobile, normal contour.    Adnexa: ovaries non-enlarged, no adnexal masses  Rectal: deferred  Lymphatic: no evidence of inguinal lymphadenopathy Extremities: no edema, erythema, or tenderness Neurologic: Grossly intact Psychiatric: mood appropriate, affect full   Assessment: 71 y.o. G0P0000 Well woman exam with PAP  Plan: Problem List Items Addressed This Visit    None    Visit Diagnoses    Well woman exam with routine gynecological exam    -  Primary   Relevant Orders   Pap IG (Image Guided)   Cervical cancer screening       Relevant Orders   Pap IG (Image Guided)   Bacterial vaginosis       Relevant Medications   metroNIDAZOLE (FLAGYL) 500 MG tablet      1) Mammogram - recommend yearly screening mammogram.  Mammogram  Was ordered today  2) Recommended continue healthy diet and h2o intake and stay active  3) ASCCP guidelines and rational discussed.  Patient opts for occasional screening interval  4) Osteoporosis: bone density scan ordered today  5) Routine healthcare maintenance including cholesterol, diabetes screening discussed managed by PCP  6) Colonoscopy: per PCP  7) Metronidazole for bacterial vaginosis  8) Follow up 1 year for routine annual   Rod Can, [place]

## 2017-02-15 LAB — PAP IG (IMAGE GUIDED): PAP SMEAR COMMENT: 0

## 2017-05-17 ENCOUNTER — Ambulatory Visit: Payer: Medicare Other | Admitting: Pulmonary Disease

## 2017-06-19 NOTE — Progress Notes (Signed)
Cardiology Office Note  Date:  06/20/2017   ID:  Kellie Phelps, Kellie Phelps Kellie Phelps 03, 1947, MRN [id number]  PCP:  Kellie Commons, MD   Chief Complaint  Patient presents with  . other    12 month f/u c/o heart burn 2 wks. Meds reviewed verbally with pt.    HPI:  [name] is a 71 year old woman with history of  hyperlipidemia , borderline elevated glucose levels,  hypertension ,  GERD smoker, COPD Atypical chest pain Prior cardiac cath in 2008 with no significant disease who presents for routine follow-up for her chest pain, hypertension  In follow-up today she denies any significant chest pain Periodically has pain in her leg Weight is down, modified her diet, proximally 15 pounds Continues to take her inhalers Otherwise feels well with no complaints Stopped her cholesterol medication for no apparent reason, denied side effects Concerned about coronary disease, family member with MI  Lab work reviewed from 2016 in our system Total chol 155 LDL 67 , when she was taking simvastatin 40  EKG personally reviewed by myself on todays visit Shows normal sinus rhythm rate 97 bpm rare PVC no significant ST or T-wave changes  Other past medical history Prior history of chronic cough, shortness of breath with activity, muscle pain, some wheezing. She reports having a diagnosis of COPD but did not smoke very much  symptoms of GERD for which she takes a PPI.    episode of chest pain while in [place] probably brought on by emotional stress  pain was under her left breast,  sharp.  10 out of 10. some nausea and apparently she had vomiting.  was recommended that she undergo a stress test but this was not performed   2015 was seen in theemergency room in [place]  For chest pain, where she was hypokalemic otherwise enzymes were negative.They wanted to keep her overnight but she left AGAINST MEDICAL ADVICE.   PMH:   has a past medical history of Anxiety; Atypical  chest pain; COPD (chronic obstructive pulmonary disease) ([place]); Coronary artery disease, non-occlusive; Diastolic dysfunction; GERD (gastroesophageal reflux disease); Hyperglycemia; Hyperlipidemia; and Hypertension.  PSH:    Past Surgical History:  Procedure Laterality Date  . TONSILLECTOMY      Current Outpatient Prescriptions  Medication Sig Dispense Refill  . albuterol (PROAIR HFA) 108 (90 BASE) MCG/ACT inhaler Inhale 2 puffs into the lungs every 6 (six) hours as needed. Wheezing or shortness of breath.    Marland Kitchen aspirin 81 MG tablet Take 81 mg by mouth daily.     . Calcium Carbonate-Vitamin D (CALCIUM 600+D) 600-200 MG-UNIT TABS Take by mouth.    . dexlansoprazole (DEXILANT) 60 MG capsule Take 1 capsule (60 mg total) by mouth daily. 90 capsule 3  . Fluticasone-Salmeterol (ADVAIR DISKUS) 250-50 MCG/DOSE AEPB Inhale 1 puff into the lungs as needed.     . Multiple Vitamins-Minerals (ONE-A-DAY WOMENS 50 PLUS PO) Take by mouth daily.    . Omega-3 Fatty Acids (FISH OIL) 1000 MG CAPS 1,000 mg. Take two tablets by mouth daily.     Marland Kitchen tiotropium (SPIRIVA) 18 MCG inhalation capsule Place 18 mcg into inhaler and inhale daily.      Marland Kitchen olmesartan-hydrochlorothiazide (BENICAR HCT) 20-12.5 MG tablet Take 1 tablet by mouth daily. 90 tablet 4  . simvastatin (ZOCOR) 40 MG tablet Take 1 tablet (40 mg total) by mouth daily. 90 tablet 4   No current facility-administered medications for this visit.      Allergies:   Penicillins  Social History:  The patient  reports that she quit smoking about 38 years ago. She smoked 0.00 packs per day. She has never used smokeless tobacco. She reports that she does not drink alcohol or use drugs.   Family History:   family history includes Coronary artery disease in her brother and other; Heart attack in her father; Hepatitis in her brother; Pulmonary embolism in her brother; Uterine cancer in her mother.    Review of Systems: Review of Systems  Constitutional:  Negative.   Respiratory: Negative.   Cardiovascular: Negative.        Rare chest tightness bilaterally underneath the breasts  Gastrointestinal: Negative.   Musculoskeletal: Negative.   Neurological: Negative.   Psychiatric/Behavioral: Negative.   All other systems reviewed and are negative.    PHYSICAL EXAM: VS:  BP 90/68 (BP Location: Left Arm, Patient Position: Sitting, Cuff Size: Normal)   Pulse 97   Ht 5\' 6"  (1.676 m)   Wt 205 lb 8 oz (93.2 kg)   BMI 33.17 kg/m  , BMI Body mass index is 33.17 kg/m. GEN: Well nourished, well developed, in no acute distress, obese  HEENT: normal  Neck: no JVD, carotid bruits, or masses Cardiac: RRR; no murmurs, rubs, or gallops,no edema  Respiratory:  clear to auscultation bilaterally, normal work of breathing GI: soft, nontender, nondistended, + BS MS: no deformity or atrophy  Skin: warm and dry, no rash Neuro:  Strength and sensation are intact Psych: euthymic mood, full affect    Recent Labs: No results found for requested labs within last 8760 hours.    Lipid Panel Lab Results  Component Value Date   CHOL 155 03/31/2015   HDL 45 03/31/2015   LDLCALC 67 03/31/2015   TRIG 217 (H) 03/31/2015      Wt Readings from Last 3 Encounters:  06/20/17 205 lb 8 oz (93.2 kg)  02/13/17 211 lb (95.7 kg)  06/18/16 222 lb 8 oz (100.9 kg)       ASSESSMENT AND PLAN:  Coronary artery disease, non-occlusive - Plan: EKG 12-Lead Denies any chest pain, no further workup at this time  Essential hypertension - Plan: EKG 12-Lead Blood pressure is running low today even on my recheck She denies any symptoms concerning for infection. Has not even taken her medication today. Recommended she cut her Benicar HCTZ in half, closely monitor blood pressure at home She does report occasional dizziness concerning for orthostasis Difficult to distinguish as she also reports having some vertigo type symptoms  Mixed hyperlipidemia Cholesterol  reasonably well controlled 2 years ago, she stop simvastatin on her own Recommended she restart simvastatin  Pulmonary emphysema, unspecified emphysema type (HCC) Currently on inhalers, reports stable  Stop smoking in the past  Atypical chest pain Long history of atypical symptoms, felt to be noncardiac Currently stable   Total encounter time more than 25 minutes  Greater than 50% was spent in counseling and coordination of care with the patient  Disposition:   F/U  12 months   Orders Placed This Encounter  Procedures  . EKG 12-Lead     Signed, Esmond Plants, M.D., Ph.D. 06/20/2017  [facility] [place], [place]

## 2017-06-20 ENCOUNTER — Ambulatory Visit (INDEPENDENT_AMBULATORY_CARE_PROVIDER_SITE_OTHER): Payer: Medicare Other | Admitting: Cardiovascular Disease

## 2017-06-20 ENCOUNTER — Encounter: Payer: Self-pay | Admitting: Cardiovascular Disease

## 2017-06-20 VITALS — BP 90/68 | HR 97 | Ht 66.0 in | Wt 205.5 lb

## 2017-06-20 DIAGNOSIS — E782 Mixed hyperlipidemia: Secondary | ICD-10-CM

## 2017-06-20 DIAGNOSIS — J432 Centrilobular emphysema: Secondary | ICD-10-CM | POA: Diagnosis not present

## 2017-06-20 DIAGNOSIS — I2510 Atherosclerotic heart disease of native coronary artery without angina pectoris: Secondary | ICD-10-CM

## 2017-06-20 DIAGNOSIS — Z87891 Personal history of nicotine dependence: Secondary | ICD-10-CM | POA: Diagnosis not present

## 2017-06-20 DIAGNOSIS — R0602 Shortness of breath: Secondary | ICD-10-CM | POA: Diagnosis not present

## 2017-06-20 DIAGNOSIS — I10 Essential (primary) hypertension: Secondary | ICD-10-CM

## 2017-06-20 MED ORDER — SIMVASTATIN 40 MG PO TABS: 40.0000 mg | ORAL_TABLET | Freq: Every day | ORAL | 4 refills | Status: DC

## 2017-06-20 MED ORDER — OLMESARTAN MEDOXOMIL-HCTZ 20-12.5 MG PO TABS: 1.0000 | ORAL_TABLET | Freq: Every day | ORAL | 4 refills | Status: DC

## 2017-06-20 NOTE — Patient Instructions (Addendum)
Medication Instructions:   Cut the micardis HCT in 1/2 daily  For heartburn, Take generic pepcid or zantac with tums, Stay on dexilant   Labwork:  No new labs needed  Testing/Procedures:  No further testing at this time   Follow-Up: It was a pleasure seeing you in the office today. Please call us if you have new issues that need to be addressed before your next appt.  [phone]  Your physician wants you to follow-up in: 12 months.  You will receive a reminder letter in the mail two months in advance. If you don't receive a letter, please call our office to schedule the follow-up appointment.  If you need a refill on your cardiac medications before your next appointment, please call your pharmacy.

## 2017-07-29 ENCOUNTER — Other Ambulatory Visit: Payer: Self-pay | Admitting: Family Medicine

## 2017-07-29 DIAGNOSIS — M8580 Other specified disorders of bone density and structure, unspecified site: Secondary | ICD-10-CM

## 2017-07-29 DIAGNOSIS — Z1231 Encounter for screening mammogram for malignant neoplasm of breast: Secondary | ICD-10-CM

## 2018-01-13 ENCOUNTER — Telehealth: Payer: Self-pay | Admitting: Cardiovascular Disease

## 2018-01-13 NOTE — Telephone Encounter (Signed)
Pt c/o of Chest Pain: STAT if CP now or developed within 24 hours  1. Are you having CP right now?  unknown  2. Are you experiencing any other symptoms (ex. SOB, nausea, vomiting, sweating)? unknown  3. How long have you been experiencing CP? unknown  4. Is your CP continuous or coming and going? Comes and goes   5. Have you taken Nitroglycerin? Unknown   Patient called to schedule an early appt with dr. Rockey Situ for twinges of pain offered 4-30 at 8 am patient declined due to transportation offered next available 5/13 at 320 patient declined she prefers morning patient offered 6/3 at 920 next available morning but declined not soon enough   Patient stated she would wait for pain to go away or go to hospital and disconnected call.  ?

## 2018-01-13 NOTE — Telephone Encounter (Signed)
Patient returning call from [place].

## 2018-01-13 NOTE — Telephone Encounter (Signed)
Spoke with patient and she reports having some sharp pains that come on from time to time after she eats. Recommended that she try some tums and reviewed signs and symptoms to monitor for that would require immediate attention in the ED. She verbalized understanding of our conversation, agreement with plan, and had no further questions at this time.

## 2018-01-13 NOTE — Telephone Encounter (Signed)
Left voicemail message for patient to call back.

## 2018-01-13 NOTE — Telephone Encounter (Signed)
Left voicemail message to call back  

## 2018-02-03 ENCOUNTER — Encounter: Payer: Self-pay | Admitting: Pulmonary Disease

## 2018-02-03 ENCOUNTER — Ambulatory Visit (INDEPENDENT_AMBULATORY_CARE_PROVIDER_SITE_OTHER): Payer: Medicare Other | Admitting: Pulmonary Disease

## 2018-02-03 VITALS — BP 110/70 | HR 82 | Ht 66.0 in | Wt 209.0 lb

## 2018-02-03 DIAGNOSIS — J449 Chronic obstructive pulmonary disease, unspecified: Secondary | ICD-10-CM

## 2018-02-03 DIAGNOSIS — Z87891 Personal history of nicotine dependence: Secondary | ICD-10-CM

## 2018-02-03 NOTE — Patient Instructions (Signed)
Continue Spiriva daily Continue Advair at night when needed as you are currently doing Continue Pro-air inhaler as needed for increased shortness of breath, wheezing, chest tightness, cough I have ordered pulmonary function tests (lung function management) to be performed prior to next office visit Follow-up in 4 to 6 weeks

## 2018-02-03 NOTE — Progress Notes (Signed)
PULMONARY CONSULT NOTE  Requesting MD/Service: Sabino Snipes, MD Date of initial consultation: 06/04/16 Reason for consultation: COPD  PT PROFILE: 56 F with minimal remote smoking history and reported diagnosis of COPD (no prior PFTs) self referred to "check on my breathing"   DATA:   INTERVAL: No major events  SUBJ:  I initially saw Kellie Phelps in 2017 and felt that she probably had mild COPD.  Pulmonary function tests were ordered but she failed to get these performed and failed to follow-up.  She returns today at the recommendation of Dr. Rockey Situ "to get my breathing checked".  She has had no major events since my initial evaluation.  She has minimal exertional dyspnea.  She remains on Spiriva inhaler daily.  She has an Advair inhaler which she uses "when I need it".  By this, she means that she takes it occasionally at night when she feels that her breathing is worse than baseline.  She rarely uses her rescue inhaler.  She has a nebulizer at home which she has not used in several months or more.  She denies CP, fever, purulent sputum, hemoptysis, LE edema and calf tenderness.     Vitals:   02/03/18 0846 02/03/18 0850  BP:  110/70  Pulse:  82  SpO2:  95%  Weight: 209 lb (94.8 kg)   Height: 5\' 6"  (1.676 m)      EXAM:  Gen: NAD HEENT: NCAT, sclera white Neck: No JVD Lungs: breath sounds full, no wheezes or other adventitious sounds Cardiovascular: RRR, no murmurs Abdomen: Soft, nontender, normal BS Ext: without clubbing, cyanosis, edema Neuro: grossly intact Skin: Limited exam, no lesions noted   DATA:   BMP Latest Ref Rng & Units 03/31/2015 09/16/2013 08/19/2010  Glucose 65 - 99 mg/dL 118(H) 106(H) 128(H)  BUN 8 - 27 mg/dL 19 20 9   Creatinine 0.57 - 1.00 mg/dL 0.93 1.00 0.7  BUN/Creat Ratio 11 - 26 20 - -  Sodium 134 - 144 mmol/L 140 136(L) 139  Potassium 3.5 - 5.2 mmol/L 4.2 4.5 3.8  Chloride 97 - 108 mmol/L 99 105 106  CO2 18 - 29 mmol/L 19 - -  Calcium 8.7 -  10.3 mg/dL 9.5 - -    CBC Latest Ref Rng & Units 09/16/2013 09/16/2013 08/19/2010  WBC 4.0 - 10.5 K/uL - 8.3 -  Hemoglobin 12.0 - 15.0 g/dL 15.6(H) 15.3(H) 15.3(H)  Hematocrit 36.0 - 46.0 % 46.0 43.6 45.0  Platelets 150 - 400 K/uL - 213 -    CXR:  No recent films available  IMPRESSION:   Former smoker - Plan: Pulmonary Function Test [facility] Only  Suspected mild COPD - Plan: Pulmonary Function Test [facility] Only   Mild exertional dyspnea with some day today variability  PLAN:  1) continue Spiriva daily 2) Continue Advair @ night as needed 3) Continue Pro-air as needed 4) Follow up in 4 to 6 weeks with PFTs prior to that visit   Merton Border, MD PCCM service Mobile [phone] Pager [phone] 02/03/2018 9:27 AM

## 2018-02-13 ENCOUNTER — Ambulatory Visit
Admission: RE | Admit: 2018-02-13 | Discharge: 2018-02-13 | Disposition: A | Discharge status: Home / Self Care | Payer: Medicare Other | Source: Ambulatory Visit | Attending: Family Medicine | Admitting: Family Medicine

## 2018-02-13 DIAGNOSIS — Z1231 Encounter for screening mammogram for malignant neoplasm of breast: Secondary | ICD-10-CM | POA: Insufficient documentation

## 2018-03-04 ENCOUNTER — Ambulatory Visit: Payer: Medicare Other

## 2018-03-11 ENCOUNTER — Ambulatory Visit: Payer: Medicare Other | Admitting: Pulmonary Disease

## 2018-05-09 ENCOUNTER — Ambulatory Visit: Payer: Medicare Other | Admitting: Obstetrics and Gynecology

## 2018-08-12 NOTE — Progress Notes (Signed)
Cardiology Office Note  Date:  08/13/2018   ID:  Kellie Phelps, Kellie Phelps August 19, 1946, MRN [id number]  PCP:  Herminio Commons, MD   Chief Complaint  Patient presents with  . other    12 month follow up. Meds reviewed by the pt. verbally. Pt. c/o chest tightness, left leg pain, tightness in bottom of feet and ankiles and shortness of breath.     HPI:  Kellie Phelps is a 72 year old woman with history of  hyperlipidemia , borderline elevated glucose levels,  hypertension ,  GERD smoker, COPD, former smoker remote 50 yrs ago Atypical chest pain Prior cardiac cath in 2008 with no significant disease who presents for routine follow-up for her chest pain, hypertension  In follow-up today reports having some pain in her feet, around the heels Also sometimes having calf pain rest, sometimes with walking  Denies any significant chest pain concerning for angina No regular walking program  Does not check blood pressure at home Tolerating statin  Lab work reviewed from primary care showing creatinine 0.97 glucose 98 hematocrit 44  EKG personally reviewed by myself on todays visit Shows normal sinus rhythm with rate 82 bpm low voltage limb leads rare PVC  Other past medical history Prior history of chronic cough, shortness of breath with activity, muscle pain, some wheezing. She reports having a diagnosis of COPD but did not smoke very much  symptoms of GERD for which she takes a PPI.    episode of chest pain while in [place] probably brought on by emotional stress  pain was under her left breast,  sharp.  10 out of 10. some nausea and apparently she had vomiting.  was recommended that she undergo a stress test but this was not performed   2015 was seen in theemergency room in [place]  For chest pain, where she was hypokalemic otherwise enzymes were negative.They wanted to keep her overnight but she left AGAINST MEDICAL ADVICE.   PMH:   has a past medical  history of Anxiety, Atypical chest pain, COPD (chronic obstructive pulmonary disease) ([place]), Coronary artery disease, non-occlusive, Diastolic dysfunction, GERD (gastroesophageal reflux disease), Hyperglycemia, Hyperlipidemia, and Hypertension.  PSH:    Past Surgical History:  Procedure Laterality Date  . TONSILLECTOMY      Current Outpatient Medications  Medication Sig Dispense Refill  . albuterol (PROAIR HFA) 108 (90 BASE) MCG/ACT inhaler Inhale 2 puffs into the lungs every 6 (six) hours as needed. Wheezing or shortness of breath.    Marland Kitchen aspirin 81 MG tablet Take 81 mg by mouth daily.     . Calcium Carbonate-Vitamin D (CALCIUM 600+D) 600-200 MG-UNIT TABS Take by mouth.    . dexlansoprazole (DEXILANT) 60 MG capsule Take 1 capsule (60 mg total) by mouth daily. 90 capsule 3  . Fluticasone-Salmeterol (ADVAIR DISKUS) 250-50 MCG/DOSE AEPB Inhale 1 puff into the lungs as needed.     . Multiple Vitamins-Minerals (ONE-A-DAY WOMENS 50 PLUS PO) Take by mouth daily.    Marland Kitchen olmesartan-hydrochlorothiazide (BENICAR HCT) 20-12.5 MG tablet Take 1 tablet by mouth daily. 90 tablet 4  . Omega-3 Fatty Acids (FISH OIL) 1000 MG CAPS 1,000 mg. Take two tablets by mouth daily.     . simvastatin (ZOCOR) 40 MG tablet Take 1 tablet (40 mg total) by mouth daily. 90 tablet 3  . tiotropium (SPIRIVA) 18 MCG inhalation capsule Place 18 mcg into inhaler and inhale daily.       No current facility-administered medications for this visit.  Allergies:   Penicillins   Social History:  The patient  reports that she quit smoking about 39 years ago. She smoked 0.00 packs per day. She has never used smokeless tobacco. She reports that she does not drink alcohol or use drugs.   Family History:   family history includes Coronary artery disease in her brother and other; Heart attack in her father; Hepatitis in her brother; Pulmonary embolism in her brother; Uterine cancer in her mother.    Review of Systems: Review of Systems   Constitutional: Negative.   Respiratory: Negative.   Cardiovascular: Negative.        Rare chest tightness bilaterally underneath the breasts  Gastrointestinal: Negative.   Musculoskeletal: Negative.        Pain in the feet  Neurological: Negative.   Psychiatric/Behavioral: Negative.   All other systems reviewed and are negative.    PHYSICAL EXAM: VS:  BP 118/75 (BP Location: Left Arm, Patient Position: Sitting, Cuff Size: Normal)   Pulse 82   Ht 5\' 4"  (1.626 m)   Wt 212 lb (96.2 kg)   BMI 36.39 kg/m  , BMI Body mass index is 36.39 kg/m. Constitutional:  oriented to person, place, and time. No distress.  HENT:  Head: Grossly normal Eyes:  no discharge. No scleral icterus.  Neck: No JVD, no carotid bruits  Cardiovascular: Regular rate and rhythm, no murmurs appreciated Pulmonary/Chest: Clear to auscultation bilaterally, no wheezes or rails Abdominal: Soft.  no distension.  no tenderness.  Musculoskeletal: Normal range of motion Neurological:  normal muscle tone. Coordination normal. No atrophy Skin: Skin warm and dry Psychiatric: normal affect, pleasant   Recent Labs: No results found for requested labs within last 8760 hours.    Lipid Panel Lab Results  Component Value Date   CHOL 155 03/31/2015   HDL 45 03/31/2015   LDLCALC 67 03/31/2015   TRIG 217 (H) 03/31/2015      Wt Readings from Last 3 Encounters:  08/13/18 212 lb (96.2 kg)  02/03/18 209 lb (94.8 kg)  06/20/17 205 lb 8 oz (93.2 kg)       ASSESSMENT AND PLAN:  Coronary artery disease, non-occlusive - Plan: EKG 12-Lead No anginal symptoms Previous cardiac catheterization with no significant disease No further work-up needed  Essential hypertension - Plan: EKG 12-Lead Blood pressure stable, no medication changes made  Mixed hyperlipidemia Recommend she continue on her simvastatin LDL 67  Pulmonary emphysema, unspecified emphysema type (HCC) Currently on inhalers, reports stable  Prior  smoking history  Atypical chest pain Long history of atypical symptoms, felt to be noncardiac No further work-up needed   Total encounter time more than 25 minutes  Greater than 50% was spent in counseling and coordination of care with the patient  Disposition:   F/U as needed   Orders Placed This Encounter  Procedures  . EKG 12-Lead     Signed, Esmond Plants, M.D., Ph.D. 08/13/2018  [place], [place]

## 2018-08-13 ENCOUNTER — Ambulatory Visit (INDEPENDENT_AMBULATORY_CARE_PROVIDER_SITE_OTHER): Payer: Medicare Other | Admitting: Cardiovascular Disease

## 2018-08-13 ENCOUNTER — Encounter: Payer: Self-pay | Admitting: Cardiovascular Disease

## 2018-08-13 VITALS — BP 118/75 | HR 82 | Ht 64.0 in | Wt 212.0 lb

## 2018-08-13 DIAGNOSIS — I10 Essential (primary) hypertension: Secondary | ICD-10-CM | POA: Diagnosis not present

## 2018-08-13 DIAGNOSIS — R0602 Shortness of breath: Secondary | ICD-10-CM

## 2018-08-13 DIAGNOSIS — I2510 Atherosclerotic heart disease of native coronary artery without angina pectoris: Secondary | ICD-10-CM | POA: Diagnosis not present

## 2018-08-13 DIAGNOSIS — J432 Centrilobular emphysema: Secondary | ICD-10-CM | POA: Diagnosis not present

## 2018-08-13 DIAGNOSIS — E782 Mixed hyperlipidemia: Secondary | ICD-10-CM

## 2018-08-13 DIAGNOSIS — Z87891 Personal history of nicotine dependence: Secondary | ICD-10-CM

## 2018-08-13 MED ORDER — SIMVASTATIN 40 MG PO TABS: 40.0000 mg | ORAL_TABLET | Freq: Every day | ORAL | 3 refills | Status: DC

## 2018-08-13 NOTE — Patient Instructions (Addendum)
Ask primary care about plantar fascitis If symptoms get severe, you might need to see primary care or podiatry  Medication Instructions:  No changes  If you need a refill on your cardiac medications before your next appointment, please call your pharmacy.    Lab work: No new labs needed   If you have labs (blood work) drawn today and your tests are completely normal, you will receive your results only by: Marland Kitchen MyChart Message (if you have MyChart) OR . A paper copy in the mail If you have any lab test that is abnormal or we need to change your treatment, we will call you to review the results.   Testing/Procedures: No new testing needed   Follow-Up: At [facility], you and your health needs are our priority.  As part of our continuing mission to provide you with exceptional heart care, we have created designated Provider Care Teams.  These Care Teams include your primary Cardiologist (physician) and Advanced Practice Providers (APPs -  Physician Assistants and Nurse Practitioners) who all work together to provide you with the care you need, when you need it.  . You will need a follow up appointment as needed   . Providers on your designated Care Team:   . Murray Hodgkins, NP . Christell Faith, PA-C . Marrianne Mood, PA-C  Any Other Special Instructions Will Be Listed Below (If Applicable).  For educational health videos Log in to : [email or web address] Or : [email or web address], password : [email or web address]

## 2018-08-19 ENCOUNTER — Other Ambulatory Visit: Payer: Self-pay | Admitting: Cardiovascular Disease

## 2018-08-21 ENCOUNTER — Ambulatory Visit: Payer: Medicare Other | Attending: Pulmonary Disease

## 2018-08-21 DIAGNOSIS — Z87891 Personal history of nicotine dependence: Secondary | ICD-10-CM | POA: Diagnosis not present

## 2018-08-21 DIAGNOSIS — J449 Chronic obstructive pulmonary disease, unspecified: Secondary | ICD-10-CM

## 2018-08-21 MED ORDER — ALBUTEROL SULFATE (2.5 MG/3ML) 0.083% IN NEBU
2.5000 mg | INHALATION_SOLUTION | Freq: Once | RESPIRATORY_TRACT | Status: AC
  Administered 2018-08-21: 2.5 mg via RESPIRATORY_TRACT
  Filled 2018-08-21: qty 3

## 2018-08-28 ENCOUNTER — Encounter: Payer: Self-pay | Admitting: Pulmonary Disease

## 2018-08-28 ENCOUNTER — Ambulatory Visit (INDEPENDENT_AMBULATORY_CARE_PROVIDER_SITE_OTHER): Payer: Medicare Other | Admitting: Pulmonary Disease

## 2018-08-28 ENCOUNTER — Other Ambulatory Visit: Payer: Self-pay

## 2018-08-28 VITALS — BP 98/70 | HR 84 | Ht 65.0 in | Wt 216.0 lb

## 2018-08-28 DIAGNOSIS — J4530 Mild persistent asthma, uncomplicated: Secondary | ICD-10-CM

## 2018-08-28 MED ORDER — FLUTICASONE-SALMETEROL 250-50 MCG/DOSE IN AEPB: 1.0000 | INHALATION_SPRAY | Freq: Two times a day (BID) | RESPIRATORY_TRACT | 0 refills | Status: DC

## 2018-08-28 NOTE — Progress Notes (Signed)
PULMONARY OFFICE FOLLOW UP  NOTE  Requesting MD/Service: Sabino Snipes, MD Date of initial consultation: 06/04/16 Reason for consultation: COPD  PT PROFILE: 27 F with minimal remote smoking history and reported diagnosis of COPD (no prior PFTs) self referred to "check on my breathing"   DATA: 08/21/2018 PFTs: FVC: 2.50 > 2.97 L (106 > 125 %pred), FEV1: 1.45 > 1.73 L (79 > 94 %pred), FEV1/FVC: 58%, TLC: 5.71 L (112 %pred), DLCO 73 %pred.  Flow volume curve consistent with mild obstruction.  19% improvement in FEV1 after bronchodilator therapy    INTERVAL: Last seen May of this year.  No major pulmonary events.  SUBJ:  She again failed to follow-up as previously scheduled.  However she did get her PFTs last week.  Since last seen, she has had no major problems from a respiratory point of view.  She remains on Spiriva inhaler once a day.  She continues to use albuterol inhaler "as needed" which is rarely.  She rarely uses her albuterol rescue inhaler.  She continues have mild to moderate exertional dyspnea with little day-to-day variation.  She denies CP, fever, purulent sputum, hemoptysis, LE edema and calf tenderness.     Vitals:   08/28/18 0853  BP: 98/70  Pulse: 84  SpO2: 93%  Weight: 216 lb (98 kg)  Height: 5\' 5"  (1.651 m)  Room air  EXAM:  Gen: NAD, mildly obese HEENT: NCAT, sclera white Neck: No JVD Lungs: breath sounds full, no wheezes or other adventitious sounds Cardiovascular: RRR, no murmurs Abdomen: Soft, nontender, normal BS Ext: without clubbing, cyanosis, edema Neuro: grossly intact Skin: Limited exam, no lesions noted    DATA:   BMP Latest Ref Rng & Units 03/31/2015 09/16/2013 08/19/2010  Glucose 65 - 99 mg/dL 118(H) 106(H) 128(H)  BUN 8 - 27 mg/dL 19 20 9   Creatinine 0.57 - 1.00 mg/dL 0.93 1.00 0.7  BUN/Creat Ratio 11 - 26 20 - -  Sodium 134 - 144 mmol/L 140 136(L) 139  Potassium 3.5 - 5.2 mmol/L 4.2 4.5 3.8  Chloride 97 - 108 mmol/L 99 105 106  CO2 18  - 29 mmol/L 19 - -  Calcium 8.7 - 10.3 mg/dL 9.5 - -    CBC Latest Ref Rng & Units 09/16/2013 09/16/2013 08/19/2010  WBC 4.0 - 10.5 K/uL - 8.3 -  Hemoglobin 12.0 - 15.0 g/dL 15.6(H) 15.3(H) 15.3(H)  Hematocrit 36.0 - 46.0 % 46.0 43.6 45.0  Platelets 150 - 400 K/uL - 213 -    CXR: No new film  IMPRESSION:   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 Her very minimal smoking history and significant degree of reversibility after bronchodilator therapy suggests that this is best labeled as asthma.  PLAN:  She is instructed to discontinue Spiriva inhaler She is instructed to use her Advair 250/50 inhaler twice a day She is instructed to use albuterol as needed for increased wheezing, cough, shortness of breath, chest tightness  Follow-up in 3 months.  Call sooner if needed   Merton Border, MD PCCM service Mobile [phone] Pager [phone] 08/28/2018 9:16 AM

## 2018-08-28 NOTE — Patient Instructions (Signed)
Stop Spiriva inhaler Begin Advair, 1 inhalation twice a day.  Rinse mouth after use Continue albuterol inhaler as needed for increased shortness of breath, wheezing, cough, chest tightness Follow-up in 3 months.  Call sooner if needed

## 2018-11-18 ENCOUNTER — Other Ambulatory Visit: Payer: Self-pay | Admitting: Pulmonary Disease

## 2019-02-06 ENCOUNTER — Telehealth: Payer: Self-pay | Admitting: Cardiovascular Disease

## 2019-02-06 NOTE — Telephone Encounter (Signed)
Virtual Visit Pre-Appointment Phone Call  "(Name), I am calling you today to discuss your upcoming appointment. We are currently trying to limit exposure to the virus that causes COVID-19 by seeing patients at home rather than in the office."  1. "What is the BEST phone number to call the day of the visit?" - include this in appointment notes  2. Do you have or have access to (through a family member/friend) a smartphone with video capability that we can use for your visit?" a. If yes - list this number in appt notes as cell (if different from BEST phone #) and list the appointment type as a VIDEO visit in appointment notes b. If no - list the appointment type as a PHONE visit in appointment notes  3. Confirm consent - "In the setting of the current Covid19 crisis, you are scheduled for a (phone or video) visit with your provider on (date) at (time).  Just as we do with many in-office visits, in order for you to participate in this visit, we must obtain consent.  If you'd like, I can send this to your mychart (if signed up) or email for you to review.  Otherwise, I can obtain your verbal consent now.  All virtual visits are billed to your insurance company just like a normal visit would be.  By agreeing to a virtual visit, we'd like you to understand that the technology does not allow for your provider to perform an examination, and thus may limit your provider's ability to fully assess your condition. If your provider identifies any concerns that need to be evaluated in person, we will make arrangements to do so.  Finally, though the technology is pretty good, we cannot assure that it will always work on either your or our end, and in the setting of a video visit, we may have to convert it to a phone-only visit.  In either situation, we cannot ensure that we have a secure connection.  Are you willing to proceed?" STAFF: Did the patient verbally acknowledge consent to telehealth visit? Document  YES/NO here: YES  4. Advise patient to be prepared - "Two hours prior to your appointment, go ahead and check your blood pressure, pulse, oxygen saturation, and your weight (if you have the equipment to check those) and write them all down. When your visit starts, your provider will ask you for this information. If you have an Apple Watch or Kardia device, please plan to have heart rate information ready on the day of your appointment. Please have a pen and paper handy nearby the day of the visit as well."  5. Give patient instructions for MyChart download to smartphone OR [email or web address] as below if video visit (depending on what platform provider is using)  6. Inform patient they will receive a phone call 15 minutes prior to their appointment time (may be from unknown caller ID) so they should be prepared to answer    TELEPHONE CALL NOTE  Willow Shidler has been deemed a candidate for a follow-up tele-health visit to limit community exposure during the Covid-19 pandemic. I spoke with the patient via phone to ensure availability of phone/video source, confirm preferred email & phone number, and discuss instructions and expectations.  I reminded Kellie Phelps to be prepared with any vital sign and/or heart rhythm information that could potentially be obtained via home monitoring, at the time of her visit. I reminded Kellie Phelps to expect a phone call prior to  her visit.  Kellie Phelps 02/06/2019 11:19 AM   INSTRUCTIONS FOR DOWNLOADING THE MYCHART APP TO SMARTPHONE  - The patient must first make sure to have activated MyChart and know their login information - If Apple, go to [employer] and type in MyChart in the search bar and download the app. If Android, ask patient to go to [employer] and type in [place] in the search bar and download the app. The app is free but as with any other app downloads, their phone may require them to verify saved payment  information or Apple/Android password.  - The patient will need to then log into the app with their MyChart username and password, and select [facility] [facility] as their healthcare provider to link the account. When it is time for your visit, go to the MyChart app, find appointments, and click Begin Video Visit. Be sure to Select Allow for your device to access the Microphone and Camera for your visit. You will then be connected, and your provider will be with you shortly.  **If they have any issues connecting, or need assistance please contact MyChart service desk [phone])**  **If using a computer, in order to ensure the best quality for their visit they will need to use either of the following Internet Browsers: [employer], or Google Chrome**  IF USING DOXIMITY or DOXY.ME - The patient will receive a link just prior to their visit by text.     FULL LENGTH CONSENT FOR TELE-HEALTH VISIT   I hereby voluntarily request, consent and authorize [facility] and its employed or contracted physicians, physician assistants, nurse practitioners or other licensed health care professionals (the Practitioner), to provide me with telemedicine health care services (the Services") as deemed necessary by the treating Practitioner. I acknowledge and consent to receive the Services by the Practitioner via telemedicine. I understand that the telemedicine visit will involve communicating with the Practitioner through live audiovisual communication technology and the disclosure of certain medical information by electronic transmission. I acknowledge that I have been given the opportunity to request an in-person assessment or other available alternative prior to the telemedicine visit and am voluntarily participating in the telemedicine visit.  I understand that I have the right to withhold or withdraw my consent to the use of telemedicine in the course of my care at any time, without affecting my right  to future care or treatment, and that the Practitioner or I may terminate the telemedicine visit at any time. I understand that I have the right to inspect all information obtained and/or recorded in the course of the telemedicine visit and may receive copies of available information for a reasonable fee.  I understand that some of the potential risks of receiving the Services via telemedicine include:   Delay or interruption in medical evaluation due to technological equipment failure or disruption;  Information transmitted may not be sufficient (e.g. poor resolution of images) to allow for appropriate medical decision making by the Practitioner; and/or   In rare instances, security protocols could fail, causing a breach of personal health information.  Furthermore, I acknowledge that it is my responsibility to provide information about my medical history, conditions and care that is complete and accurate to the best of my ability. I acknowledge that Practitioner's advice, recommendations, and/or decision may be based on factors not within their control, such as incomplete or inaccurate data provided by me or distortions of diagnostic images or specimens that may result from electronic transmissions. I  understand that the practice of medicine is not an exact science and that Practitioner makes no warranties or guarantees regarding treatment outcomes. I acknowledge that I will receive a copy of this consent concurrently upon execution via email to the email address I last provided but may also request a printed copy by calling the office of [facility].    I understand that my insurance will be billed for this visit.   I have read or had this consent read to me.  I understand the contents of this consent, which adequately explains the benefits and risks of the Services being provided via telemedicine.   I have been provided ample opportunity to ask questions regarding this consent and the Services  and have had my questions answered to my satisfaction.  I give my informed consent for the services to be provided through the use of telemedicine in my medical care  By participating in this telemedicine visit I agree to the above.

## 2019-02-15 NOTE — Progress Notes (Signed)
Virtual Visit via Telephone Note   This visit type was conducted due to national recommendations for restrictions regarding the COVID-19 Pandemic (e.g. social distancing) in an effort to limit this patient's exposure and mitigate transmission in our community.  Due to her co-morbid illnesses, this patient is at least at moderate risk for complications without adequate follow up.  This format is felt to be most appropriate for this patient at this time.  The patient did not have access to video technology/had technical difficulties with video requiring transitioning to audio format only (telephone).  All issues noted in this document were discussed and addressed.  No physical exam could be performed with this format.  Please refer to the patient's chart for her  consent to telehealth for [facility].   I connected with  Kellie Phelps on 02/16/19 by a video enabled telemedicine application and verified that I am speaking with the correct person using two identifiers. I discussed the limitations of evaluation and management by telemedicine. The patient expressed understanding and agreed to proceed.   Evaluation Performed:  Follow-up visit  Date:  02/16/2019   ID:  Kellie, Phelps 02-05-1946, MRN [id number]  Patient Location:  [address] [place] [address]   Provider location:   [facility], [place] office  PCP:  Herminio Commons, MD  Cardiologist:  Patsy Baltimore   Chief Complaint:  SOB on heavy exertion   History of Present Illness:    Kellie Phelps is a 73 y.o. female who presents via audio/video conferencing for a telehealth visit today.   The patient does not symptoms concerning for COVID-19 infection (fever, chills, cough, or new SHORTNESS OF BREATH).   Patient has a past medical history of hyperlipidemia , borderline elevated glucose levels,  hypertension ,  GERD smoker, COPD, former smoker remote 50  yrs ago Atypical chest pain Prior cardiac cath in 2008 with no significant disease who presents for routine follow-up for her chest pain, hypertension.  Shortness of breath  Allergies, runny nose, not taking anything for it On advair BID spiriva was better, once a day (pulmonary stopped the inhaler) spiriva was helping better with her shortness of breath, seems to be worse without the Spiriva inhaler  Denies any significant chest pain concerning for angina No regular walking program Main complaint is shortness of breath on exertion and she gives out when she is walking  stable pain in her feet, calf pain rest, sometimes with walking  Records reviewed, no recent echocardiogram, last one was 10 years ago  Other past medical history  chronic cough, shortness of breath with activity, muscle pain, some wheezing. She reports having a diagnosis of COPD but did not smoke very much  symptoms of GERD for which she takes a PPI.    episode of chest pain while in [place] probably brought on by emotional stress pain was under her left breast, sharp. 10 out of 10. some nausea and apparently she had vomiting. was recommended that she undergo a stress test but this was not performed  2015 was seen in the emergency room in [place] For chest pain, where she was hypokalemic otherwise enzymes were negative.They wanted to keep her overnight but she left AGAINST MEDICAL ADVICE.   Prior CV studies:   The following studies were reviewed today:    Past Medical History:  Diagnosis Date  . Anxiety   . Atypical chest pain   . COPD (chronic  obstructive pulmonary disease) ([place])   . Coronary artery disease, non-occlusive    a. LHC 6/08 EF 55-60%, 20-30% proximal LAD, LIs CFX, LIs RCA ([place]); b. ETT-myoview 11/10, 3' stopped due to fatigue and chest tightness, EF normal, probably normal perfusion images with minimal soft tissue attenuation  . Diastolic dysfunction    a. echo 2010: EF 60-65%,  GR1DD, no AI, trivial MR, LA nl  . GERD (gastroesophageal reflux disease)   . Hyperglycemia   . Hyperlipidemia   . Hypertension    Past Surgical History:  Procedure Laterality Date  . TONSILLECTOMY       No outpatient medications have been marked as taking for the 02/16/19 encounter (Appointment) with Minna Merritts, MD.     Allergies:   Penicillins   Social History   Tobacco Use  . Smoking status: Former Smoker    Packs/day: 0.00    Last attempt to quit: 09/17/1978    Years since quitting: 40.4  . Smokeless tobacco: Never Used  Substance Use Topics  . Alcohol use: No  . Drug use: No     Current Outpatient Medications on File Prior to Visit  Medication Sig Dispense Refill  . albuterol (PROAIR HFA) 108 (90 BASE) MCG/ACT inhaler Inhale 2 puffs into the lungs every 6 (six) hours as needed. Wheezing or shortness of breath.    Marland Kitchen aspirin 81 MG tablet Take 81 mg by mouth daily.     . Calcium Carbonate-Vitamin D (CALCIUM 600+D) 600-200 MG-UNIT TABS Take by mouth.    . dexlansoprazole (DEXILANT) 60 MG capsule Take 1 capsule (60 mg total) by mouth daily. 90 capsule 3  . Fluticasone-Salmeterol (ADVAIR) 250-50 MCG/DOSE AEPB INHALE 1 DOSE BY MOUTH TWICE DAILY RINSE MOUTH AFTER USE 180 each 0  . Multiple Vitamins-Minerals (ONE-A-DAY WOMENS 50 PLUS PO) Take by mouth daily.    Marland Kitchen olmesartan-hydrochlorothiazide (BENICAR HCT) 20-12.5 MG tablet TAKE 1 TABLET BY MOUTH ONCE DAILY 60 tablet 3  . Omega-3 Fatty Acids (FISH OIL) 1000 MG CAPS 1,000 mg. Take two tablets by mouth daily.     . simvastatin (ZOCOR) 40 MG tablet Take 1 tablet (40 mg total) by mouth daily. 90 tablet 3   No current facility-administered medications on file prior to visit.      Family Hx: The patient's family history includes Coronary artery disease in her brother and another family member; Heart attack in her father; Hepatitis in her brother; Pulmonary embolism in her brother; Uterine cancer in her mother.  ROS:   Please  see the history of present illness.    Review of Systems  Constitutional: Negative.   Respiratory: Positive for shortness of breath.   Cardiovascular: Negative.   Gastrointestinal: Negative.   Musculoskeletal: Negative.   Neurological: Negative.   Psychiatric/Behavioral: Negative.   All other systems reviewed and are negative.     Labs/Other Tests and Data Reviewed:    Recent Labs: No results found for requested labs within last 8760 hours.   Recent Lipid Panel Lab Results  Component Value Date/Time   CHOL 155 03/31/2015 02:39 PM   TRIG 217 (H) 03/31/2015 02:39 PM   HDL 45 03/31/2015 02:39 PM   CHOLHDL 3.4 03/31/2015 02:39 PM   CHOLHDL 5 10/17/2012 09:46 AM   LDLCALC 67 03/31/2015 02:39 PM    Wt Readings from Last 3 Encounters:  08/28/18 216 lb (98 kg)  08/13/18 212 lb (96.2 kg)  02/03/18 209 lb (94.8 kg)     Exam:    Vital Signs:  Vital signs may also be detailed in the HPI There were no vitals taken for this visit.  Wt Readings from Last 3 Encounters:  08/28/18 216 lb (98 kg)  08/13/18 212 lb (96.2 kg)  02/03/18 209 lb (94.8 kg)   Temp Readings from Last 3 Encounters:  09/18/13 98.6 F (37 C) (Oral)  09/16/13 97.6 F (36.4 C) (Oral)  12/17/11 97.4 F (36.3 C) (Oral)   BP Readings from Last 3 Encounters:  08/28/18 98/70  08/13/18 118/75  02/03/18 110/70   Pulse Readings from Last 3 Encounters:  08/28/18 84  08/13/18 82  02/03/18 82    Pulse 80 BP 110/70 resp 16  Well nourished, well developed female in no acute distress. Constitutional:  oriented to person, place, and time. No distress.     ASSESSMENT & PLAN:    Centrilobular emphysema (HCC) Stable , sats in the high 90s  Mixed hyperlipidemia Well-controlled total cholesterol 155  Shortness of breath Chronic issue, perhaps worse recently without Spiriva Unclear if this is secondary to COPD or other etiology She is deconditioned recommend walking program Echocardiogram ordered, last  one was 10 years ago Troubled by more shortness of breath recently  TOBACCO ABUSE, HX OF Remote history of smoking  Essential hypertension Blood pressure is well controlled on today's visit. No changes made to the medications.  Coronary artery disease, non-occlusive No further ischemic work-up needed, prior cardiac catheterization with no significant disease  COVID-19 Education: The signs and symptoms of COVID-19 were discussed with the patient and how to seek care for testing (follow up with PCP or arrange E-visit).  The importance of social distancing was discussed today.  Patient Risk:   After full review of this patients clinical status, I feel that they are at least moderate risk at this time.  Time:   Today, I have spent 25 minutes with the patient with telehealth technology discussing the cardiac and medical problems/diagnoses detailed above   10 min spent reviewing the chart prior to patient visit today   Medication Adjustments/Labs and Tests Ordered: Current medicines are reviewed at length with the patient today.  Concerns regarding medicines are outlined above.   Tests Ordered: No tests ordered   Medication Changes: No changes made   Disposition: Follow-up in 12 months   Signed, Ida Rogue, MD  02/16/2019 8:35 AM    [place] Office [address] [place] [address], [place], [place] [address]

## 2019-02-16 ENCOUNTER — Telehealth (INDEPENDENT_AMBULATORY_CARE_PROVIDER_SITE_OTHER): Payer: Medicare Other | Admitting: Cardiovascular Disease

## 2019-02-16 ENCOUNTER — Telehealth: Payer: Self-pay | Admitting: Pulmonary Disease

## 2019-02-16 ENCOUNTER — Other Ambulatory Visit: Payer: Self-pay

## 2019-02-16 DIAGNOSIS — Z87891 Personal history of nicotine dependence: Secondary | ICD-10-CM

## 2019-02-16 DIAGNOSIS — I10 Essential (primary) hypertension: Secondary | ICD-10-CM

## 2019-02-16 DIAGNOSIS — J432 Centrilobular emphysema: Secondary | ICD-10-CM | POA: Diagnosis not present

## 2019-02-16 DIAGNOSIS — E782 Mixed hyperlipidemia: Secondary | ICD-10-CM

## 2019-02-16 DIAGNOSIS — I2510 Atherosclerotic heart disease of native coronary artery without angina pectoris: Secondary | ICD-10-CM

## 2019-02-16 DIAGNOSIS — R0602 Shortness of breath: Secondary | ICD-10-CM

## 2019-02-16 MED ORDER — SIMVASTATIN 40 MG PO TABS: 40.0000 mg | ORAL_TABLET | Freq: Every day | ORAL | 3 refills | Status: DC

## 2019-02-16 MED ORDER — OLMESARTAN MEDOXOMIL-HCTZ 20-12.5 MG PO TABS: 1.0000 | ORAL_TABLET | Freq: Every day | ORAL | 3 refills | Status: DC

## 2019-02-16 NOTE — Patient Instructions (Addendum)
Medication Instructions:  Continue current medications. Refills sent into your pharmacy.   If you need a refill on your cardiac medications before your next appointment, please call your pharmacy.    Lab work: No new labs needed   If you have labs (blood work) drawn today and your tests are completely normal, you will receive your results only by: Marland Kitchen MyChart Message (if you have MyChart) OR . A paper copy in the mail If you have any lab test that is abnormal or we need to change your treatment, we will call you to review the results.   Testing/Procedures: We will order an echo for shortness of breath Your physician has requested that you have an echocardiogram. Echocardiography is a painless test that uses sound waves to create images of your heart. It provides your doctor with information about the size and shape of your heart and how well your heart's chambers and valves are working. This procedure takes approximately one hour. There are no restrictions for this procedure.    Follow-Up: At [facility], you and your health needs are our priority.  As part of our continuing mission to provide you with exceptional heart care, we have created designated Provider Care Teams.  These Care Teams include your primary Cardiologist (physician) and Advanced Practice Providers (APPs -  Physician Assistants and Nurse Practitioners) who all work together to provide you with the care you need, when you need it.  . You will need a follow up appointment in 12 months .   Please call our office 2 months in advance to schedule this appointment.    . Providers on your designated Care Team:   . Murray Hodgkins, NP . Christell Faith, PA-C . Marrianne Mood, PA-C  Any Other Special Instructions Will Be Listed Below (If Applicable).  For educational health videos Log in to : [email or web address] Or : [email or web address], password : [email or web address]   Echocardiogram An echocardiogram is a procedure that uses painless  sound waves (ultrasound) to produce an image of the heart. Images from an echocardiogram can provide important information about:  Signs of coronary artery disease (CAD).  Aneurysm detection. An aneurysm is a weak or damaged part of an artery wall that bulges out from the normal force of blood pumping through the body.  Heart size and shape. Changes in the size or shape of the heart can be associated with certain conditions, including heart failure, aneurysm, and CAD.  Heart muscle function.  Heart valve function.  Signs of a past heart attack.  Fluid buildup around the heart.  Thickening of the heart muscle.  A tumor or infectious growth around the heart valves. Tell a health care provider about:  Any allergies you have.  All medicines you are taking, including vitamins, herbs, eye drops, creams, and over-the-counter medicines.  Any blood disorders you have.  Any surgeries you have had.  Any medical conditions you have.  Whether you are pregnant or may be pregnant. What are the risks? Generally, this is a safe procedure. However, problems may occur, including:  Allergic reaction to dye (contrast) that may be used during the procedure. What happens before the procedure? No specific preparation is needed. You may eat and drink normally. What happens during the procedure?   An IV tube may be inserted into one of your veins.  You may receive contrast through this tube. A contrast is an injection that improves the quality of the pictures from your heart.  A gel will be applied to your  chest.  A wand-like tool (transducer) will be moved over your chest. The gel will help to transmit the sound waves from the transducer.  The sound waves will harmlessly bounce off of your heart to allow the heart images to be captured in real-time motion. The images will be recorded on a computer. The procedure may vary among health care providers and hospitals. What happens after the  procedure?  You may return to your normal, everyday life, including diet, activities, and medicines, unless your health care provider tells you not to do that. Summary  An echocardiogram is a procedure that uses painless sound waves (ultrasound) to produce an image of the heart.  Images from an echocardiogram can provide important information about the size and shape of your heart, heart muscle function, heart valve function, and fluid buildup around your heart.  You do not need to do anything to prepare before this procedure. You may eat and drink normally.  After the echocardiogram is completed, you may return to your normal, everyday life, unless your health care provider tells you not to do that. This information is not intended to replace advice given to you by your health care provider. Make sure you discuss any questions you have with your health care provider. Document Released: 08/31/2000 Document Revised: 10/06/2016 Document Reviewed: 10/06/2016 Elsevier Interactive Patient Education  2019 [employer].

## 2019-02-16 NOTE — Telephone Encounter (Signed)
Called and spoke to pt.  Pt states at last OV, she was instructed to stop Spiriva handihaler.  She is requesting to resume spiriva, as she has been experiencing increased sob with exertion x2-36mo.  Pt currently taking  advair  250 BID.  DS please advise. Thanks

## 2019-02-17 NOTE — Telephone Encounter (Signed)
Is she using Advair as recommended? That is the preferred med for her. If not, I recommend that she get back on Advair If she is using Advair and still having symptoms, she may resume Spiriva  Thanks  Waunita Schooner

## 2019-02-17 NOTE — Telephone Encounter (Signed)
Called and spoke to pt and relayed below recommendations.  Pt stated that she is in fact using advair 250 BID.  Pt stated that she would resume spiriva, as she has a Rx at home.  Nothing further is needed at this time.

## 2019-03-09 ENCOUNTER — Telehealth: Payer: Self-pay

## 2019-03-09 NOTE — Telephone Encounter (Signed)

## 2019-03-10 ENCOUNTER — Other Ambulatory Visit: Payer: Medicare Other

## 2019-03-30 ENCOUNTER — Telehealth: Payer: Self-pay | Admitting: Pulmonary Disease

## 2019-03-30 NOTE — Telephone Encounter (Signed)
Called patient re medication request. Advised that we are unable to give 3 month supply at this time due to pt being past due for follow up appt. Pt agreed to me scheduling her a telephone visit on 04-10-2019 with DS. Pt declined one month supply stated she has enough to last her to her appt. And will request 12mo supply at that time.No further actions necessary at this time.

## 2019-03-30 NOTE — Telephone Encounter (Signed)
Notifed Anderson Malta that pt elected to wait and get rx for 71mo supply at ov so no rx sent at this time.

## 2019-04-03 ENCOUNTER — Other Ambulatory Visit: Payer: Medicare Other

## 2019-04-10 ENCOUNTER — Ambulatory Visit (INDEPENDENT_AMBULATORY_CARE_PROVIDER_SITE_OTHER): Payer: Medicare Other | Admitting: Pulmonary Disease

## 2019-04-10 ENCOUNTER — Encounter: Payer: Self-pay | Admitting: Pulmonary Disease

## 2019-04-10 DIAGNOSIS — J4530 Mild persistent asthma, uncomplicated: Secondary | ICD-10-CM

## 2019-04-10 MED ORDER — ALBUTEROL SULFATE HFA 108 (90 BASE) MCG/ACT IN AERS: 2.0000 | INHALATION_SPRAY | Freq: Four times a day (QID) | RESPIRATORY_TRACT | 10 refills | Status: DC | PRN

## 2019-04-10 MED ORDER — FLUTICASONE-SALMETEROL 250-50 MCG/DOSE IN AEPB: INHALATION_SPRAY | RESPIRATORY_TRACT | 4 refills | Status: DC

## 2019-04-10 NOTE — Progress Notes (Signed)
PULMONARY OFFICE FOLLOW UP  NOTE  Requesting MD/Service: Sabino Snipes, MD Date of initial consultation: 06/04/16 Reason for consultation: COPD  PT PROFILE: 57 F with minimal remote smoking history and reported diagnosis of COPD (no prior PFTs) self referred to "check on my breathing"   DATA: 08/21/2018 PFTs: FVC: 2.50 > 2.97 L (106 > 125 %pred), FEV1: 1.45 > 1.73 L (79 > 94 %pred), FEV1/FVC: 58%, TLC: 5.71 L (112 %pred), DLCO 73 %pred.  Flow volume curve consistent with mild obstruction.  19% improvement in FEV1 after bronchodilator therapy   Virtual Visit via Telephone Note I connected with Kellie Phelps at  9:15 AM EDT by telephone and verified that I am speaking with the correct person using two identifiers. I discussed the limitations, risks, security and privacy concerns of performing an evaluation and management service by telephone and the availability of in person appointments. I also discussed with the patient that there may be a patient responsible charge related to this service. The patient expressed understanding and agreed to proceed.    INTERVAL: Last last encounter 08/28/2018.  No major pulmonary events.   SUBJ:  This is a scheduled follow-up which was performed remotely (via telephone) due to the coronavirus pandemic.  She has had no major problems since last visit in December.  She is well maintained on Advair (for which she needs a refill).  She uses her albuterol rescue inhaler no more than 2 times per week.  She has minimal dyspnea and cough.  She has no other respiratory complaints.  There were no vitals filed for this visit.  EXAM:  Due to the remote nature of this encounter, no physical examination could be performed   DATA:   BMP Latest Ref Rng & Units 03/31/2015 09/16/2013 08/19/2010  Glucose 65 - 99 mg/dL 118(H) 106(H) 128(H)  BUN 8 - 27 mg/dL 19 20 9   Creatinine 0.57 - 1.00 mg/dL 0.93 1.00 0.7  BUN/Creat Ratio 11 - 26 20 - -  Sodium  134 - 144 mmol/L 140 136(L) 139  Potassium 3.5 - 5.2 mmol/L 4.2 4.5 3.8  Chloride 97 - 108 mmol/L 99 105 106  CO2 18 - 29 mmol/L 19 - -  Calcium 8.7 - 10.3 mg/dL 9.5 - -    CBC Latest Ref Rng & Units 09/16/2013 09/16/2013 08/19/2010  WBC 4.0 - 10.5 K/uL - 8.3 -  Hemoglobin 12.0 - 15.0 g/dL 15.6(H) 15.3(H) 15.3(H)  Hematocrit 36.0 - 46.0 % 46.0 43.6 45.0  Platelets 150 - 400 K/uL - 213 -    CXR: No new film  IMPRESSION:     ICD-10-CM   1. Mild persistent asthma without complication  W73.71     PLAN:  Continue Advair 250/50, 1 inhalation twice a day.  Rinse mouth after use.  Prescription refilled  Continue albuterol inhaler as needed for increased shortness of breath, chest tightness, wheezing, cough.  Prescription refilled  Follow-up in this office as needed.  Dr. Gwynneth Aliment should be able to refill her inhalers in the future as needed    Merton Border, MD PCCM service Mobile [phone] Pager [phone] 04/10/2019 2:09 PM

## 2019-04-10 NOTE — Patient Instructions (Signed)
Continue Advair 250/50, 1 inhalation twice a day.  Rinse mouth after use.  Prescription refilled  Continue albuterol inhaler as needed for increased shortness of breath, chest tightness, wheezing, cough.  Prescription refilled  Follow-up in this office as needed.  Dr. Gwynneth Aliment should be able to refill your inhaler medicines in the future as needed

## 2019-04-15 ENCOUNTER — Telehealth: Payer: Self-pay | Admitting: Pulmonary Disease

## 2019-04-15 NOTE — Telephone Encounter (Signed)
Returned call to patient advised that the generic is fine but if she would rather have the brand name to just let the pharmacist know, also since she has already purchased this refill pt stated she will request 3 mo supply through the pharmacy with the next refill request. No further actions at this time.

## 2019-04-21 ENCOUNTER — Other Ambulatory Visit: Payer: Medicare Other

## 2019-05-12 ENCOUNTER — Ambulatory Visit (INDEPENDENT_AMBULATORY_CARE_PROVIDER_SITE_OTHER): Payer: Medicare Other

## 2019-05-12 ENCOUNTER — Other Ambulatory Visit: Payer: Self-pay

## 2019-05-12 DIAGNOSIS — R0602 Shortness of breath: Secondary | ICD-10-CM

## 2019-05-14 ENCOUNTER — Telehealth: Payer: Self-pay | Admitting: *Deleted

## 2019-05-14 DIAGNOSIS — R079 Chest pain, unspecified: Secondary | ICD-10-CM

## 2019-05-14 NOTE — Telephone Encounter (Signed)
-----   Message from Minna Merritts, MD sent at 05/14/2019  2:57 PM EDT ----- Echocardiogram Normal cardiac function No significant valve disease The reason for shortness of breath based on the study, suspect secondary to lungs

## 2019-05-14 NOTE — Telephone Encounter (Signed)
Results called to pt. Pt verbalized understanding. Patient asked if that meant she did not have any clogged arteries. 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 She is concerned that she could have another one. At times she has pain over her left chest that is sharp.  She is worried it is another "clogged artery."  It would ease her mind if Dr Rockey Situ would order a stress test. She would like to just make sure.  Advised I did not see in his last office visit note where this was recommended.  She wanted me to just ask Dr Rockey Situ. Routing to him.

## 2019-05-14 NOTE — Telephone Encounter (Signed)
Okay to order stress test I will be in the hospital Tuesday through Friday next week if she would like to do it on a day when I am there to review her images

## 2019-05-18 NOTE — Telephone Encounter (Signed)
Please clarify what type of stress test. Lexiscan?

## 2019-05-19 NOTE — Telephone Encounter (Signed)
lexiscan thx

## 2019-05-20 NOTE — Telephone Encounter (Signed)
Call to patient to review instructions for stress test.  Pt verbalized understanding. Scheduling will call for appt information. Instructions mailed to address on file.  [place]  Your caregiver has ordered a Stress Test with nuclear imaging. The purpose of this test is to evaluate the blood supply to your heart muscle. This procedure is referred to as a "Non-Invasive Stress Test." This is because other than having an IV started in your vein, nothing is inserted or "invades" your body. Cardiac stress tests are done to find areas of poor blood flow to the heart by determining the extent of coronary artery disease (CAD). Some patients exercise on a treadmill, which naturally increases the blood flow to your heart, while others who are  unable to walk on a treadmill due to physical limitations have a pharmacologic/chemical stress agent called Lexiscan . This medicine will mimic walking on a treadmill by temporarily increasing your coronary blood flow.   Please note: these test may take anywhere between 2-4 hours to complete  PLEASE REPORT TO [place] AT THE FIRST DESK WILL DIRECT YOU WHERE TO GO  Date of Procedure:_____________________________________  Arrival Time for Procedure:______________________________  Instructions regarding medication:  ____:  Hold other medications as follows:____no meds to hold_______  PLEASE NOTIFY THE OFFICE AT LEAST 24 HOURS IN ADVANCE IF YOU ARE UNABLE TO KEEP YOUR APPOINTMENT.  [phone] AND  PLEASE NOTIFY NUCLEAR MEDICINE AT [facility] AT LEAST 24 HOURS IN ADVANCE IF YOU ARE UNABLE TO KEEP YOUR APPOINTMENT. [phone]  How to prepare for your Myoview test:  1. Do not eat or drink after midnight 2. No caffeine for 24 hours prior to test 3. No smoking 24 hours prior to test. 4. Your medication may be taken with water.  If your doctor stopped a medication because of this test, do not take that medication. 5. Ladies, please  do not wear dresses.  Skirts or pants are appropriate. Please wear a short sleeve shirt. 6. No perfume, cologne or lotion. 7. Wear comfortable walking shoes. No heels!

## 2019-05-22 NOTE — Telephone Encounter (Signed)
Spoke with Patient to schedule. Patient declined to set up testing at time due to required covid test.  Patient states she doesn't have corona, will not be doing the testing for it, and will do the stress test another time.    Patient also going to the beach next week.

## 2019-05-22 NOTE — Telephone Encounter (Signed)
Left voicemail message to call back regarding stress test. I do not see that it has been scheduled so wanted to review this with her.

## 2019-05-22 NOTE — Telephone Encounter (Signed)
Left voicemail message that COVID test not required for this type of stress test and to please call back and schedule.

## 2019-06-09 NOTE — Telephone Encounter (Signed)
Made call to patient to see if she would be willing to schedule lexiscan at this time since she will not need any covid testing. No answer, LMTCB.   Routing to scheduling in case she calls back so they will be aware.

## 2019-07-07 ENCOUNTER — Telehealth: Payer: Self-pay | Admitting: Cardiovascular Disease

## 2019-07-07 NOTE — Telephone Encounter (Signed)
Patient has questions regarding her stress test on Friday.

## 2019-07-07 NOTE — Telephone Encounter (Signed)
Returned call to patient. All questions answered.   No further needs at this time.   Advised pt to call for any further questions or concerns.

## 2019-07-10 ENCOUNTER — Telehealth: Payer: Self-pay | Admitting: Cardiovascular Disease

## 2019-07-10 ENCOUNTER — Ambulatory Visit: Payer: Medicare Other

## 2019-07-10 MED ORDER — SIMVASTATIN 40 MG PO TABS: 40.0000 mg | ORAL_TABLET | Freq: Every day | ORAL | 2 refills | Status: DC

## 2019-07-10 NOTE — Telephone Encounter (Signed)
[facility] and nervous about using medication for testing .  Patient would rather not have dye and chemicals injected in her body and wants to know if she has to do this test .

## 2019-07-10 NOTE — Telephone Encounter (Signed)
Patient aware that Dr. Rockey Situ would have to discuss / decide alternative care plan to lexi .  She decided to cancel lexi and come to ov to discuss options.    Patient would also like someone to call and discuss echo results.

## 2019-07-10 NOTE — Telephone Encounter (Signed)
I called and spoke with the patient. She states she just couldn't go through with her stress test as she doesn't want to have anything injected in her veins.  I advised her that is fine and she can talk with Dr. Rockey Situ further about this when she comes in in November.  She also wanted the results of her echo from August of this year.  I advised her that one of our nurses spoke with her on 05/14/19 about the results, but the patient did not recall this.  I have discussed her echo results with her again today. She voiced understanding.  The patient also states that she had stopped her simvastatin because she was supposed to take this at night- she found her RX and it had expired, but she would like to retry it and she will take it at night.   I advised I will re-order this for her- she request [place].  She is due to see Dr. Rockey Situ back on 07/29/19.

## 2019-07-28 NOTE — Progress Notes (Signed)
Evaluation Performed:  Follow-up visit  Date:  07/29/2019   ID:  Kellie Phelps, Kellie Phelps August 05, 1946, MRN [id number]  Patient Location:  [address] Kellie Phelps [address]   Provider location:   [facility], [place] office  PCP:  Kellie Commons, MD  Cardiologist:  Kellie Phelps   Chief Complaint  Patient presents with  . Other    Patient c/o SOB when walking. Patient refused Lexi and stated " there is another way to see the arteries" Meds reviewed verbally with patient.      History of Present Illness:    Kellie Phelps is a 73 y.o. female who presents via audio/video conferencing for a telehealth visit today.   The patient does not symptoms concerning for COVID-19 infection (fever, chills, cough, or new SHORTNESS OF BREATH).   Patient has a past medical history of hyperlipidemia , borderline elevated glucose levels,  hypertension ,  GERD smoker, COPD, former smoker remote 50 yrs ago Atypical chest pain Prior cardiac cath in 2008 with no significant disease who presents for routine follow-up for her chest pain, hypertension.  Shortness of breath  In follow-up today reports that she is still concerned about her heart Declined stress test, did not want to get injected with anything  Reports having some shortness of breath on exertion Typically only with heavy exertion, feels symptoms are stable  Echo 04/2019 results reviewed with her 1. The left ventricle has normal systolic function with an ejection fraction of 60-65%. The cavity size was normal. Left ventricular diastolic Doppler parameters are consistent with impaired relaxation. No evidence of left ventricular regional wall  motion abnormalities.  No regular exercise program Weight running high Using her inhalers, previously on Spiriva  EKG personally reviewed by myself on todays visit Shows normal sinus rhythm rate 78 bpm no significant ST-T wave changes   Other past medical history  chronic cough, shortness of breath with activity, muscle pain, some wheezing. She reports having a diagnosis of COPD but did not smoke very much  symptoms of GERD for which she takes a PPI.    episode of chest pain while in [place] probably brought on by emotional stress pain was under her left breast, sharp. 10 out of 10. some nausea and apparently she had vomiting. was recommended that she undergo a stress test but this was not performed  2015 was seen in the emergency room in [place] For chest pain, where she was hypokalemic otherwise enzymes were negative.They wanted to keep her overnight but she left AGAINST MEDICAL ADVICE.   Prior CV studies:   The following studies were reviewed today:    Past Medical History:  Diagnosis Date  . Anxiety   . Atypical chest pain   . COPD (chronic obstructive pulmonary disease) ([place])   . Coronary artery disease, non-occlusive    a. LHC 6/08 EF 55-60%, 20-30% proximal LAD, LIs CFX, LIs RCA (Harwani); b. ETT-myoview 11/10, 3' stopped due to fatigue and chest tightness, EF normal, probably normal perfusion images with minimal soft tissue attenuation  . Diastolic dysfunction    a. echo 2010: EF 60-65%, GR1DD, no AI, trivial MR, LA nl  . GERD (gastroesophageal reflux disease)   . Hyperglycemia   . Hyperlipidemia   . Hypertension    Past Surgical History:  Procedure Laterality Date  . TONSILLECTOMY       Current Meds  Medication Sig  . albuterol (PROAIR HFA) 108 (90 Base)  MCG/ACT inhaler Inhale 2 puffs into the lungs every 6 (six) hours as needed. Wheezing or shortness of breath.  Marland Kitchen aspirin 81 MG tablet Take 81 mg by mouth daily.   . Calcium Carbonate-Vitamin D (CALCIUM 600+D) 600-200 MG-UNIT TABS Take by mouth.  . dexlansoprazole (DEXILANT) 60 MG capsule Take 1 capsule (60 mg total) by mouth daily.  . Fluticasone-Salmeterol (ADVAIR) 250-50 MCG/DOSE AEPB INHALE 1 DOSE BY MOUTH TWICE DAILY RINSE MOUTH  AFTER USE  . Multiple Vitamins-Minerals (ONE-A-DAY WOMENS 50 PLUS PO) Take by mouth daily.  Marland Kitchen olmesartan-hydrochlorothiazide (BENICAR HCT) 20-12.5 MG tablet Take 1 tablet by mouth daily.  . Omega-3 Fatty Acids (FISH OIL) 1000 MG CAPS 1,000 mg. Take two tablets by mouth daily.   . simvastatin (ZOCOR) 40 MG tablet Take 1 tablet (40 mg total) by mouth daily.     Allergies:   Penicillins   Social History   Tobacco Use  . Smoking status: Former Smoker    Packs/day: 0.00    Quit date: 09/17/1978    Years since quitting: 40.8  . Smokeless tobacco: Never Used  Substance Use Topics  . Alcohol use: No  . Drug use: No     Current Outpatient Medications on File Prior to Visit  Medication Sig Dispense Refill  . albuterol (PROAIR HFA) 108 (90 Base) MCG/ACT inhaler Inhale 2 puffs into the lungs every 6 (six) hours as needed. Wheezing or shortness of breath. 18 g 10  . aspirin 81 MG tablet Take 81 mg by mouth daily.     . Calcium Carbonate-Vitamin D (CALCIUM 600+D) 600-200 MG-UNIT TABS Take by mouth.    . dexlansoprazole (DEXILANT) 60 MG capsule Take 1 capsule (60 mg total) by mouth daily. 90 capsule 3  . Fluticasone-Salmeterol (ADVAIR) 250-50 MCG/DOSE AEPB INHALE 1 DOSE BY MOUTH TWICE DAILY RINSE MOUTH AFTER USE 180 each 4  . Multiple Vitamins-Minerals (ONE-A-DAY WOMENS 50 PLUS PO) Take by mouth daily.    Marland Kitchen olmesartan-hydrochlorothiazide (BENICAR HCT) 20-12.5 MG tablet Take 1 tablet by mouth daily. 90 tablet 3  . Omega-3 Fatty Acids (FISH OIL) 1000 MG CAPS 1,000 mg. Take two tablets by mouth daily.     . simvastatin (ZOCOR) 40 MG tablet Take 1 tablet (40 mg total) by mouth daily. 90 tablet 2   No current facility-administered medications on file prior to visit.      Family Hx: The patient's family history includes Coronary artery disease in her brother and another family member; Heart attack in her father; Hepatitis in her brother; Pulmonary embolism in her brother; Uterine cancer in her mother.   ROS:   Please see the history of present illness.    Review of Systems  Constitutional: Negative.   Respiratory: Positive for shortness of breath.   Cardiovascular: Negative.   Gastrointestinal: Negative.   Musculoskeletal: Negative.   Neurological: Negative.   Psychiatric/Behavioral: Negative.   All other systems reviewed and are negative.    Labs/Other Tests and Data Reviewed:    Recent Labs: No results found for requested labs within last 8760 hours.   Recent Lipid Panel Lab Results  Component Value Date/Time   CHOL 155 03/31/2015 02:39 PM   TRIG 217 (H) 03/31/2015 02:39 PM   HDL 45 03/31/2015 02:39 PM   CHOLHDL 3.4 03/31/2015 02:39 PM   CHOLHDL 5 10/17/2012 09:46 AM   LDLCALC 67 03/31/2015 02:39 PM    Wt Readings from Last 3 Encounters:  07/29/19 209 lb (94.8 kg)  08/28/18 216 lb (98 kg)  08/13/18 212 lb (96.2 kg)     Exam:    Vital Signs: Vital signs may also be detailed in the HPI BP 126/70 (BP Location: Left Arm, Patient Position: Sitting, Cuff Size: Normal)   Pulse 78   Ht 5\' 5"  (1.651 m)   Wt 209 lb (94.8 kg)   BMI 34.78 kg/m   Wt Readings from Last 3 Encounters:  07/29/19 209 lb (94.8 kg)  08/28/18 216 lb (98 kg)  08/13/18 212 lb (96.2 kg)   Temp Readings from Last 3 Encounters:  09/18/13 98.6 F (37 C) (Oral)  09/16/13 97.6 F (36.4 C) (Oral)  12/17/11 97.4 F (36.3 C) (Oral)   BP Readings from Last 3 Encounters:  07/29/19 126/70  08/28/18 98/70  08/13/18 118/75   Pulse Readings from Last 3 Encounters:  07/29/19 78  08/28/18 84  08/13/18 82   Constitutional:  oriented to person, place, and time. No distress.  HENT:  Head: Grossly normal Eyes:  no discharge. No scleral icterus.  Neck: No JVD, no carotid bruits  Cardiovascular: Regular rate and rhythm, no murmurs appreciated Pulmonary/Chest: Clear to auscultation bilaterally, no wheezes or rails Abdominal: Soft.  no distension.  no tenderness.  Musculoskeletal: Normal range of  motion Neurological:  normal muscle tone. Coordination normal. No atrophy Skin: Skin warm and dry Psychiatric: normal affect, pleasant  ASSESSMENT & PLAN:    Centrilobular emphysema (HCC) On inhalers, stable mild chronic shortness of breath  Mixed hyperlipidemia Well-controlled total cholesterol 155  Shortness of breath Chronic issue, secondary to COPD, weight, deconditioning We did discuss CT coronary calcium scoring She initially agreed to have the test for risk stratification Declined the test when she learned it was $150 She also declined stress testing, did not want to be injected with any medication Given the above, medical management at this time  TOBACCO ABUSE, HX OF Remote history of smoking  again recommended she not smoke  Essential hypertension Blood pressure stable, no medication changes made  Coronary artery disease, non-occlusive No further ischemic work-up needed, prior cardiac catheterization with no significant disease Anxious about SOB Declined to pay any money for testing, does not want to be injected for stress testing with stress test/Myoview   Total encounter time more than 25 minutes  Greater than 50% was spent in counseling and coordination of care with the patient     Disposition: Follow-up in 12 months   Signed, Ida Rogue, MD  07/29/2019 10:06 AM    [facility] [id number], [place], [place] [address]

## 2019-07-29 ENCOUNTER — Ambulatory Visit (INDEPENDENT_AMBULATORY_CARE_PROVIDER_SITE_OTHER): Payer: Medicare Other | Admitting: Cardiovascular Disease

## 2019-07-29 ENCOUNTER — Encounter: Payer: Self-pay | Admitting: Cardiovascular Disease

## 2019-07-29 ENCOUNTER — Other Ambulatory Visit: Payer: Self-pay

## 2019-07-29 VITALS — BP 126/70 | HR 78 | Ht 65.0 in | Wt 209.0 lb

## 2019-07-29 DIAGNOSIS — R079 Chest pain, unspecified: Secondary | ICD-10-CM | POA: Diagnosis not present

## 2019-07-29 DIAGNOSIS — I10 Essential (primary) hypertension: Secondary | ICD-10-CM

## 2019-07-29 DIAGNOSIS — E782 Mixed hyperlipidemia: Secondary | ICD-10-CM

## 2019-07-29 DIAGNOSIS — Z87891 Personal history of nicotine dependence: Secondary | ICD-10-CM | POA: Diagnosis not present

## 2019-07-29 DIAGNOSIS — J432 Centrilobular emphysema: Secondary | ICD-10-CM

## 2019-07-29 DIAGNOSIS — R0602 Shortness of breath: Secondary | ICD-10-CM | POA: Diagnosis not present

## 2019-07-29 NOTE — Patient Instructions (Addendum)
We will order a CT coronary calcium score for shortness of breath   Medication Instructions:  No changes  If you need a refill on your cardiac medications before your next appointment, please call your pharmacy.    Lab work: No new labs needed   If you have labs (blood work) drawn today and your tests are completely normal, you will receive your results only by: Marland Kitchen MyChart Message (if you have MyChart) OR . A paper copy in the mail If you have any lab test that is abnormal or we need to change your treatment, we will call you to review the results.   Testing/Procedures: We will order CT coronary calcium score $150   Please call [phone] to schedule [facility]  [address], [place] [address]   Follow-Up: At [facility], you and your health needs are our priority.  As part of our continuing mission to provide you with exceptional heart care, we have created designated Provider Care Teams.  These Care Teams include your primary Cardiologist (physician) and Advanced Practice Providers (APPs -  Physician Assistants and Nurse Practitioners) who all work together to provide you with the care you need, when you need it.  . You will need a follow up appointment in 12 months .   Please call our office 2 months in advance to schedule this appointment.    . Providers on your designated Care Team:   . Murray Hodgkins, NP . Christell Faith, PA-C . Marrianne Mood, PA-C  Any Other Special Instructions Will Be Listed Below (If Applicable).  For educational health videos Log in to : [email or web address] Or : [email or web address], password : [email or web address]

## 2019-07-30 ENCOUNTER — Telehealth: Payer: Self-pay | Admitting: *Deleted

## 2019-07-30 NOTE — Telephone Encounter (Signed)
Called patient to schedule CT CA Score for DR Gollan left message to call back to schedule at [phone]

## 2019-08-31 ENCOUNTER — Telehealth: Payer: Self-pay | Admitting: Cardiovascular Disease

## 2019-08-31 NOTE — Telephone Encounter (Signed)
Spoke with patient and she states that [employer] will pay for the test. Advised that this test is not typically paid by insurance and is $150.00. She states that she called her insurance company and reviewed what test was ordered and reported that they would pay for it. Advised I would route over to them but not sure if they would file it towards insurance. She requested that if they will bill it to please call and schedule with her.

## 2019-08-31 NOTE — Telephone Encounter (Signed)
Patient is ready to have her CT cardiac score  Would like to speak with nurse to get scheduled  Please call to discuss

## 2019-09-01 ENCOUNTER — Telehealth: Payer: Self-pay | Admitting: *Deleted

## 2019-09-01 NOTE — Telephone Encounter (Signed)
-----   Message from Osvaldo Shipper, [place] sent at 08/31/2019  4:36 PM EST ----- Regarding: CT CA Score Pam please advise pt she will need to have CT done at [facility].   Appointment  Valora Corporal, RN  You 56 minutes ago (3:39 PM)  Pt states that she called her insurance and they did report it would be paid by them and would like to schedule.   Routing comment  Valora Corporal, RN 56 minutes ago (3:38 PM)     Spoke with patient and she states that [employer] will pay for the test. Advised that this test is not typically paid by insurance and is $150.00. She states that she called her insurance company and reviewed what test was ordered and reported that they would pay for it. Advised I would route over to them but not sure if they would file it towards insurance. She requested that if they will bill it to please call and schedule with her.    Documentation  Valora Corporal, RN  Kellie Phelps, Kellie Phelps [phone]  1 hour ago (3:34 PM)  Spoke with patient.   Outgoing call  Gerringer, Caryl Pina routed conversation to Valora Corporal, RN 4 hours ago (11:40 AM) Dannielle Karvonen, Caryl Pina 4 hours ago (11:40 AM) AG    Patient is ready to have her CT cardiac score  Would like to speak with nurse to get scheduled  Please call to discuss    Documentation  [name], [name] [id number]  Gerringer, Caryl Pina

## 2019-09-01 NOTE — Telephone Encounter (Signed)
Called and spoke with [facility] regarding appointment for her scan and she reports that patient did not want to schedule testing to be done at this time.

## 2019-09-04 ENCOUNTER — Telehealth: Payer: Self-pay | Admitting: Cardiovascular Disease

## 2019-09-04 NOTE — Telephone Encounter (Signed)
Patient would like to discuss having a heart cath. Please call to discuss

## 2019-09-04 NOTE — Telephone Encounter (Signed)
Insurance does not allow [place] to go straight to catheterization They need some noninvasive testing first to indicate there is a blockage This can be done with either Myoview Unclear if insurance would cover a cardiac CTA with contrast Coronary calcium score was 1 way to quickly look for blockages

## 2019-09-07 ENCOUNTER — Telehealth: Payer: Self-pay | Admitting: Cardiovascular Disease

## 2019-09-07 NOTE — Telephone Encounter (Signed)
Patient wants to have a cath instead of a CT calcium score because insurance will pay for a cath.

## 2019-09-07 NOTE — Telephone Encounter (Signed)
Left detailed voicemail message per release form that we are not able to do that procedure without other testing to show the need. Also mentioned the cost of the CT scan would be significantly cheaper than a procedure. Provided our number to call back if she should have any further questions.

## 2019-09-07 NOTE — Telephone Encounter (Signed)
Duplicate note. See other telephone encounter.  

## 2019-09-22 ENCOUNTER — Ambulatory Visit: Payer: Medicare Other

## 2019-09-24 ENCOUNTER — Ambulatory Visit: Payer: Medicare Other

## 2019-10-05 ENCOUNTER — Telehealth: Payer: Self-pay | Admitting: Cardiovascular Disease

## 2019-10-05 NOTE — Telephone Encounter (Signed)
Please advise if ok to refill Dexlant 60 mg tablet qd 90 day refill request. Last filled by [facility].

## 2019-10-05 NOTE — Telephone Encounter (Signed)
Lmovm to contact PCP for refill.

## 2019-10-05 NOTE — Telephone Encounter (Signed)
Patient aware to call pcp .

## 2019-10-05 NOTE — Telephone Encounter (Signed)
*  STAT* If patient is at the pharmacy, call can be transferred to refill team.   1. Which medications need to be refilled? (please list name of each medication and dose if known)  dexlansoprazole (DEXLANT) 60 MG - 1 capsule daily   2. Which pharmacy/location (including street and city if local pharmacy) is medication to be sent to? [place]  3. Do they need a 30 day or 90 day supply? 90 day

## 2019-10-05 NOTE — Telephone Encounter (Signed)
Please contact PCP for further refills.

## 2019-11-23 ENCOUNTER — Other Ambulatory Visit: Payer: Self-pay

## 2019-11-23 ENCOUNTER — Encounter
Admission: RE | Admit: 2019-11-23 | Disposition: A | Discharge status: Home / Self Care | Payer: Medicare Other | Source: Ambulatory Visit

## 2019-11-24 ENCOUNTER — Encounter: Payer: Self-pay | Admitting: Gastroenterology

## 2019-11-25 ENCOUNTER — Ambulatory Visit (INDEPENDENT_AMBULATORY_CARE_PROVIDER_SITE_OTHER): Payer: Medicare Other | Admitting: Gastroenterology

## 2019-11-25 ENCOUNTER — Encounter: Payer: Self-pay | Admitting: Gastroenterology

## 2019-11-25 DIAGNOSIS — K219 Gastro-esophageal reflux disease without esophagitis: Secondary | ICD-10-CM | POA: Diagnosis not present

## 2019-11-25 MED ORDER — OMEPRAZOLE 20 MG PO CPDR: 20.0000 mg | DELAYED_RELEASE_CAPSULE | Freq: Two times a day (BID) | ORAL | 0 refills | Status: DC

## 2019-11-25 NOTE — Patient Instructions (Signed)

## 2019-11-25 NOTE — Progress Notes (Signed)
Kellie Phelps [address]  [place]  [place], [place] [address]  Main: [phone]  Fax: [phone]   Gastroenterology Consultation  Referring Provider:     Crissie Figures, PA-C Primary Care Physician:  Herminio Commons, MD Reason for Consultation:     Reflux        HPI:   Virtual Visit via Video Note  I connected with patient on 11/25/19 at 10:30 AM EST by video ([email or web address]) and verified that I am speaking with the correct person using two identifiers.   I discussed the limitations, risks, security and privacy concerns of performing an evaluation and management service by video and the availability of in person appointments. I also discussed with the patient that there may be a patient responsible charge related to this service. The patient expressed understanding and agreed to proceed.  Location of the patient: Home Location of provider: Home Participating persons: Patient and provider only (Nursing staff checked in patient via phone but were not physically involved in the video interaction - see their notes)   History of Present Illness: Chief Complaint  Patient presents with  . Gastroesophageal Reflux    Has some chest pressure   . New Patient (Initial Visit)    [name] is a 74 y.o. y/o female referred for consultation & management  by Dr. Gwynneth Aliment, Howell Rucks, MD.  Pt reports chronic reflux well controlled with Dexilant previously, with recent exacerbation of symptoms over the last few weeks to month.  No dysphagia.  No weight loss.  No nausea or vomiting.  PCP ordered antacid which patient has been taking multiple times a day.  Has not missed her Dexilant dosing.  No prior EGD or colonoscopy.  No family history of colon cancer.  Describes burning sensation in her chest multiple times a day especially after eating.  Past Medical History:  Diagnosis Date  . Anxiety   . Atypical chest pain   . COPD (chronic obstructive pulmonary disease)  ([place])   . Coronary artery disease, non-occlusive    a. LHC 6/08 EF 55-60%, 20-30% proximal LAD, LIs CFX, LIs RCA (Harwani); b. ETT-myoview 11/10, 3' stopped due to fatigue and chest tightness, EF normal, probably normal perfusion images with minimal soft tissue attenuation  . Diastolic dysfunction    a. echo 2010: EF 60-65%, GR1DD, no AI, trivial MR, LA nl  . GERD (gastroesophageal reflux disease)   . Hyperglycemia   . Hyperlipidemia   . Hypertension     Past Surgical History:  Procedure Laterality Date  . TONSILLECTOMY      Prior to Admission medications   Medication Sig Start Date End Date Taking? Authorizing Provider  albuterol (PROAIR HFA) 108 (90 Base) MCG/ACT inhaler Inhale 2 puffs into the lungs every 6 (six) hours as needed. Wheezing or shortness of breath. 04/10/19  Yes Wilhelmina Mcardle, MD  aspirin 81 MG tablet Take 81 mg by mouth daily.    Yes [provider]  Calcium Carbonate-Vitamin D (CALCIUM 600+D) 600-200 MG-UNIT TABS Take by mouth.   Yes [provider]  dexlansoprazole (DEXILANT) 60 MG capsule Take 1 capsule (60 mg total) by mouth daily. 09/27/11  Yes Minus Breeding, MD  dorzolamide (TRUSOPT) 2 % ophthalmic solution  08/24/19  Yes [provider]  Fluticasone-Salmeterol (ADVAIR) 250-50 MCG/DOSE AEPB INHALE 1 DOSE BY MOUTH TWICE DAILY RINSE MOUTH AFTER USE 04/10/19  Yes Wilhelmina Mcardle, MD  Multiple Vitamins-Minerals (ONE-A-DAY WOMENS 50 PLUS PO) Take by mouth daily.  Yes [provider]  olmesartan-hydrochlorothiazide (BENICAR HCT) 20-12.5 MG tablet Take 1 tablet by mouth daily. 02/16/19  Yes Gollan, Kathlene November, MD  Omega-3 Fatty Acids (FISH OIL) 1000 MG CAPS 1,000 mg. Take two tablets by mouth daily.    Yes [provider]  ROCKLATAN 0.02-0.005 % SOLN Apply 1 drop to eye at bedtime. 08/24/19  Yes [provider]  simvastatin (ZOCOR) 40 MG tablet Take 1 tablet (40 mg total) by mouth daily. 07/10/19  Yes Minna Merritts,  MD  VYZULTA 0.024 % SOLN  08/31/19  Yes [provider]    Family History  Problem Relation Age of Onset  . Uterine cancer Mother   . Heart attack Father   . Pulmonary embolism Brother   . Hepatitis Brother   . Coronary artery disease Brother   . Coronary artery disease Other        Aunt     Social History   Tobacco Use  . Smoking status: Former Smoker    Packs/day: 0.00    Quit date: 09/17/1978    Years since quitting: 41.2  . Smokeless tobacco: Never Used  Substance Use Topics  . Alcohol use: No  . Drug use: No    Allergies as of 11/25/2019 - Review Complete 11/25/2019  Allergen Reaction Noted  . Penicillins Hives     Review of Systems:    All systems reviewed and negative except where noted in HPI.   Observations/Objective:  Labs: CBC    Component Value Date/Time   WBC 8.3 09/16/2013 1448   RBC 5.09 09/16/2013 1448   HGB 15.6 (H) 09/16/2013 1501   HCT 46.0 09/16/2013 1501   PLT 213 09/16/2013 1448   MCV 85.7 09/16/2013 1448   MCH 30.1 09/16/2013 1448   MCHC 35.1 09/16/2013 1448   RDW 13.3 09/16/2013 1448   LYMPHSABS 1.9 08/19/2010 0932   MONOABS 0.3 08/19/2010 0932   EOSABS 0.1 08/19/2010 0932   BASOSABS 0.1 08/19/2010 0932   CMP     Component Value Date/Time   NA 140 03/31/2015 1439   K 4.2 03/31/2015 1439   CL 99 03/31/2015 1439   CO2 19 03/31/2015 1439   GLUCOSE 118 (H) 03/31/2015 1439   GLUCOSE 106 (H) 09/16/2013 1501   BUN 19 03/31/2015 1439   CREATININE 0.93 03/31/2015 1439   CALCIUM 9.5 03/31/2015 1439   PROT 7.1 03/31/2015 1439   ALBUMIN 4.4 03/31/2015 1439   AST 23 03/31/2015 1439   ALT 31 03/31/2015 1439   ALKPHOS 87 03/31/2015 1439   BILITOT 0.3 03/31/2015 1439   GFRNONAA 62 03/31/2015 1439   GFRAA 72 03/31/2015 1439    Imaging Studies: No results found.  Assessment and Plan:   [name] is a 3 y.o. y/o female has been referred for reflux  Assessment and Plan: Symptoms consistent with  breakthrough reflux despite daily PPI  We discussed options of proceeding with upper endoscopy to rule out any underlying lesions, Barrett's, or malignancy.  However, patient would like to proceed with conservative management at this time and consider endoscopy if symptoms do not get better.  Change Dexilant to omeprazole twice daily.  Proper use 30 to 45 minutes before meals discussed.  (Risks of PPI use were discussed with patient including bone loss, C. Diff diarrhea, pneumonia, infections, CKD, electrolyte abnormalities.  If clinically possible based on symptoms, goal would be to maintain patient on the lowest dose possible, or discontinue the medication with institution of acid reflux lifestyle  modifications over time. Pt. Verbalizes understanding and chooses to continue the medication.)  Patient has never had a colonoscopy and screening colonoscopy with risks and benefits also discussed.  Patient would not like to schedule at this time and will let us know if she would like to consider it in the future  Follow Up Instructions: 4 weeks  I discussed the assessment and treatment plan with the patient. The patient was provided an opportunity to ask questions and all were answered. The patient agreed with the plan and demonstrated an understanding of the instructions.   The patient was advised to call back or seek an in-person evaluation if the symptoms worsen or if the condition fails to improve as anticipated.  I provided 15 minutes of face-to-face time via video software during this encounter.  Additional time was spent in reviewing patient's chart, placing orders etc.   Virgel Manifold, MD  Speech recognition software was used to dictate the above note.

## 2019-12-23 ENCOUNTER — Ambulatory Visit (INDEPENDENT_AMBULATORY_CARE_PROVIDER_SITE_OTHER): Payer: Medicare Other | Admitting: Gastroenterology

## 2019-12-23 DIAGNOSIS — Z538 Procedure and treatment not carried out for other reasons: Secondary | ICD-10-CM

## 2020-01-11 ENCOUNTER — Other Ambulatory Visit: Payer: Self-pay | Admitting: Cardiovascular Disease

## 2020-02-22 ENCOUNTER — Other Ambulatory Visit: Payer: Self-pay | Admitting: Cardiovascular Disease

## 2020-05-27 ENCOUNTER — Telehealth: Payer: Self-pay | Admitting: Pulmonary Disease

## 2020-05-27 MED ORDER — FLUTICASONE-SALMETEROL 250-50 MCG/DOSE IN AEPB: INHALATION_SPRAY | RESPIRATORY_TRACT | 0 refills | Status: DC

## 2020-05-27 NOTE — Telephone Encounter (Signed)
Spoke with the pt  Advised needs new appt with MD since last seen over a year ago by [facility]  Appt scheduled with Patsey Berthold 07/04/20  Rx refilled x 1 only 90 day supply per pt request

## 2020-07-04 ENCOUNTER — Ambulatory Visit: Payer: Medicare Other | Admitting: Pulmonary Disease

## 2020-08-23 ENCOUNTER — Ambulatory Visit: Payer: Medicare Other | Admitting: Primary Care

## 2020-09-05 ENCOUNTER — Ambulatory Visit: Payer: Medicare Other | Admitting: Pulmonary Disease

## 2020-09-19 ENCOUNTER — Ambulatory Visit: Payer: Medicare Other | Admitting: Pulmonary Disease

## 2020-09-20 ENCOUNTER — Other Ambulatory Visit: Payer: Self-pay | Admitting: Cardiovascular Disease

## 2020-09-20 NOTE — Telephone Encounter (Signed)
Patient out of town and will call back when ready to reschedule.

## 2020-09-20 NOTE — Telephone Encounter (Signed)
Pt overdue for 12 month f/u. °Please contact pt for future appointment. °

## 2020-09-22 ENCOUNTER — Other Ambulatory Visit: Payer: Self-pay

## 2020-09-22 ENCOUNTER — Telehealth: Payer: Self-pay | Admitting: Pulmonary Disease

## 2020-09-22 NOTE — Telephone Encounter (Signed)
Pt overdue for 12 month f/u. °Please contact pt for future appointment. °

## 2020-09-22 NOTE — Telephone Encounter (Signed)
Lm for patient.   Patient will need OV prior refills. Last seen 04/10/2019

## 2020-09-22 NOTE — Telephone Encounter (Signed)
Pt returning missed call. Informed pt that she needs an ov prior to refill. Sched appt for 10/27/20. Nothing further needed at this time.

## 2020-09-22 NOTE — Telephone Encounter (Signed)
Spoke with patient, states she has a cold and will call back later

## 2020-09-22 NOTE — Telephone Encounter (Signed)
*  STAT* If patient is at the pharmacy, call can be transferred to refill team.   1. Which medications need to be refilled? (please list name of each medication and dose if known)  Simvastatin 2. Which pharmacy/location (including street and city if local pharmacy) is medication to be sent to?[facility]  3. Do they need a 30 day or 90 day supply? 90 . Patient states a 30 day supply was sent in, but she needs a 90 day

## 2020-10-05 MED ORDER — SIMVASTATIN 40 MG PO TABS: 40.0000 mg | ORAL_TABLET | Freq: Every day | ORAL | 0 refills | Status: DC

## 2020-10-05 NOTE — Telephone Encounter (Signed)
Seeing another cardiologist, does not need [place]

## 2020-10-25 ENCOUNTER — Other Ambulatory Visit: Payer: Self-pay | Admitting: Family Medicine

## 2020-10-27 ENCOUNTER — Encounter: Payer: Self-pay | Admitting: Pulmonary Disease

## 2020-10-27 ENCOUNTER — Ambulatory Visit (INDEPENDENT_AMBULATORY_CARE_PROVIDER_SITE_OTHER): Payer: Medicare Other | Admitting: Pulmonary Disease

## 2020-10-27 ENCOUNTER — Telehealth: Payer: Self-pay | Admitting: Pulmonary Disease

## 2020-10-27 ENCOUNTER — Other Ambulatory Visit: Payer: Self-pay

## 2020-10-27 VITALS — BP 122/70 | HR 71 | Temp 97.0°F | Ht 65.0 in | Wt 213.6 lb

## 2020-10-27 DIAGNOSIS — J4530 Mild persistent asthma, uncomplicated: Secondary | ICD-10-CM | POA: Diagnosis not present

## 2020-10-27 DIAGNOSIS — R0602 Shortness of breath: Secondary | ICD-10-CM

## 2020-10-27 DIAGNOSIS — J449 Chronic obstructive pulmonary disease, unspecified: Secondary | ICD-10-CM

## 2020-10-27 MED ORDER — TRELEGY ELLIPTA 200-62.5-25 MCG/INH IN AEPB: 1.0000 | INHALATION_SPRAY | Freq: Every day | RESPIRATORY_TRACT | 11 refills | Status: DC

## 2020-10-27 MED ORDER — TRELEGY ELLIPTA 100-62.5-25 MCG/INH IN AEPB: 1.0000 | INHALATION_SPRAY | Freq: Every day | RESPIRATORY_TRACT | 0 refills | Status: DC

## 2020-10-27 MED ORDER — ALBUTEROL SULFATE (2.5 MG/3ML) 0.083% IN NEBU: 2.5000 mg | INHALATION_SOLUTION | RESPIRATORY_TRACT | 2 refills | Status: DC | PRN

## 2020-10-27 MED ORDER — TRELEGY ELLIPTA 200-62.5-25 MCG/INH IN AEPB: 1.0000 | INHALATION_SPRAY | Freq: Every day | RESPIRATORY_TRACT | 0 refills | Status: AC

## 2020-10-27 NOTE — Telephone Encounter (Signed)
Rx for Trelegy 200 has been sent to preferred pharmacy.  Left detailed message for patient.

## 2020-10-27 NOTE — Progress Notes (Signed)
Subjective:    Patient ID: Kellie Phelps, female    DOB: [birth year], 75 y.o.   MRN: [id number]  Requesting MD/Service: Sabino Snipes, MD Date of initial consultation: 06/04/16 by Dr. Merton Border Reason for consultation: COPD (diagnosis now corrected to mild persistent asthma)  PT PROFILE: 1 F with minimal remote smoking history and reported diagnosis of COPD (no prior PFTs) self referred to "check on my breathing"   DATA: 08/21/2018 PFTs: FVC: 2.50 > 2.97 L (106 > 125 %pred), FEV1: 1.45 > 1.73 L (79 > 94 %pred), FEV1/FVC: 58%, TLC: 5.71 L (112 %pred), DLCO 73 %pred.  Flow volume curve consistent with mild obstruction.  19% improvement in FEV1 after bronchodilator therapy  INTERVAL: Last encounter with Dr. Alva Garnet 10 April 2019, this was a virtual visit.  At that time instructed to continue Advair 250/50 and as needed albuterol.  It was intended that she would return here as needed as her asthma is well controlled  HPI Is a 75 year old with mild persistent asthma with a very remote and insignificant smoking history tobacco abstinent since 1980.  Prior patient of Dr. Alva Garnet noted above.  She notes occasional dyspnea on exertion.  She has noted that Advair is not controlling her symptoms as well as previous.  She also does note that she occasionally skips the evening dose.,  Fevers, chills or sweats.  No nocturnal awakenings.  Average use of albuterol is approximately 2-3 times per week.  She does not endorse any lower extremity edema, calf tenderness or fatigue.  She does not endorse any other symptomatology.   Review of Systems A 10 point review of systems was performed and it is as noted above otherwise negative.  Patient Active Problem List   Diagnosis Date Noted  . Coronary artery disease, non-occlusive   . Diastolic dysfunction   . Atypical chest pain   . Hyperglycemia   . COPD (chronic obstructive pulmonary disease) ([place]) 04/02/2011  . Hyperlipidemia  07/15/2009  . Essential hypertension 07/15/2009  . SHORTNESS OF BREATH 07/15/2009  . TOBACCO ABUSE, HX OF 07/15/2009   . Allergies  Allergen Reactions  . Penicillins Hives   Current Meds  Medication Sig  . albuterol (PROAIR HFA) 108 (90 Base) MCG/ACT inhaler Inhale 2 puffs into the lungs every 6 (six) hours as needed. Wheezing or shortness of breath.  Marland Kitchen aspirin 81 MG tablet Take 81 mg by mouth daily.  . Calcium Carbonate-Vitamin D 600-200 MG-UNIT TABS Take by mouth.  . dorzolamide (TRUSOPT) 2 % ophthalmic solution   . Fluticasone-Salmeterol (ADVAIR) 250-50 MCG/DOSE AEPB INHALE 1 DOSE BY MOUTH TWICE DAILY RINSE MOUTH AFTER USE  . Multiple Vitamins-Minerals (ONE-A-DAY WOMENS 50 PLUS PO) Take by mouth daily.  Marland Kitchen olmesartan-hydrochlorothiazide (BENICAR HCT) 20-12.5 MG tablet TAKE 1 TABLET BY MOUTH DAILY  . Omega-3 Fatty Acids (FISH OIL) 1000 MG CAPS 1,000 mg. Take two tablets by mouth daily.  Marland Kitchen omeprazole (PRILOSEC) 20 MG capsule Take 1 capsule (20 mg total) by mouth 2 (two) times daily before a meal.  . ROCKLATAN 0.02-0.005 % SOLN Apply 1 drop to eye at bedtime.  . simvastatin (ZOCOR) 40 MG tablet Take 1 tablet (40 mg total) by mouth daily.  Marland Kitchen VYZULTA 0.024 % SOLN     Social History   Tobacco Use  . Smoking status: Former Smoker    Packs/day: 0.50    Types: Cigarettes    Quit date: 09/17/1978    Years since quitting: 42.1  . Smokeless tobacco: Never Used  .  Tobacco comment: unsure how long she smoked for--10/27/2020  Substance Use Topics  . Alcohol use: No    There is no immunization history on file for this patient.  We discussed Covid-19 precautions.  I reviewed the vaccine effectiveness and potential side effects in detail to include differences between mRNA vaccines and traditional vaccines (attenuated virus).  Discussion also offered of long-term effectiveness and safety profile which are unclear at this time.  Discussed current CDC guidance that all patients are recommended  COVID-19 vaccinations -as long as they do not have allergy to components of the vaccine.     Objective:   Physical Exam BP 122/70 (BP Location: Left Arm, Cuff Size: Normal)   Pulse 71   Temp (!) 97 F (36.1 C) (Temporal)   Ht 5\' 5"  (1.651 m)   Wt 213 lb 9.6 oz (96.9 kg)   SpO2 97%   BMI 35.54 kg/m  GENERAL: Obese woman, no acute distress, fully ambulatory, no conversational dyspnea HEAD: Normocephalic, atraumatic.  EYES: Pupils equal, round, reactive to light.  No scleral icterus.  MOUTH: Nose/mouth/throat not examined due to masking requirements for COVID 19. NECK: Supple. No thyromegaly. Trachea midline. No JVD.  No adenopathy. PULMONARY: Good air entry bilaterally.  No adventitious sounds. CARDIOVASCULAR: S1 and S2. Regular rate and rhythm.  No rubs, murmurs or gallops heard. ABDOMEN: Obese, benign. MUSCULOSKELETAL: No joint deformity, no clubbing, no edema.  NEUROLOGIC: No focal deficit, no gait disturbance, speech is fluent. SKIN: Intact,warm,dry. PSYCH: Mood and behavior normal      Assessment & Plan:     ICD-10-CM   1. Mild persistent asthma without complication  X32.44 Pulmonary Function Test [facility] Only    AMB REFERRAL FOR DME    DISCONTINUED: Fluticasone-Umeclidin-Vilant (TRELEGY ELLIPTA) 100-62.5-25 MCG/INH AEPB   Reassess with PFTs Trial of Trelegy Ellipta  2. Shortness of breath  R06.02    Trial of Trelegy Ellipta Assessing with PFTs If PFTs noncontributory consider 2D echo   Orders Placed This Encounter  Procedures  . AMB REFERRAL FOR DME    Referral Priority:   Routine    Referral Type:   Durable Medical Equipment Purchase    Number of Visits Requested:   1  . Pulmonary Function Test [facility] Only    Standing Status:   Future    Standing Expiration Date:   10/27/2021    Scheduling Instructions:     4wk    Order Specific Question:   Full PFT: includes the following: basic spirometry, spirometry pre & post bronchodilator, diffusion capacity (DLCO), lung  volumes    Answer:   Full PFT   Meds ordered this encounter  Medications  . DISCONTD: Fluticasone-Umeclidin-Vilant (TRELEGY ELLIPTA) 100-62.5-25 MCG/INH AEPB    Sig: Inhale 1 puff into the lungs daily for 1 day.    Dispense:  14 each    Refill:  0    Order Specific Question:   Lot Number?    Answer:   [id number]    Order Specific Question:   Expiration Date?    Answer:   09/17/2021    Order Specific Question:   Manufacturer?    Answer:   Gl[employer] [[id number]]    Order Specific Question:   Quantity    Answer:   1  . albuterol (PROVENTIL) (2.5 MG/3ML) 0.083% nebulizer solution    Sig: Take 3 mLs (2.5 mg total) by nebulization every 4 (four) hours as needed for wheezing or shortness of breath.    Dispense:  120 mL  Refill:  2    j45.30  . Fluticasone-Umeclidin-Vilant (TRELEGY ELLIPTA) 200-62.5-25 MCG/INH AEPB    Sig: Inhale 1 puff into the lungs daily for 1 day.    Dispense:  14 each    Refill:  0    Order Specific Question:   Lot Number?    Answer:   [id number]    Order Specific Question:   Manufacturer?    Answer:   Gl[employer]Smith[employer]line [[id number]]    Order Specific Question:   Quantity    Answer:   1  . DISCONTD: Fluticasone-Umeclidin-Vilant (TRELEGY ELLIPTA) 200-62.5-25 MCG/INH AEPB    Sig: Inhale 1 puff into the lungs daily.    Dispense:  28 each    Refill:  11   Patient has a history of mild persistent asthma without complication.  Has been on Advair for a number of years.  Notices some shortness of breath more than her usual baseline.  She has not had PFTs since 2019.  Will reassess lung function.  She will be given a trial of Trelegy Ellipta to optimize her asthma management.  She is to let us know if this is of benefit.  If not she may continue on Advair until reassessed.  We will see her in follow-up in she is to contact us prior to that time should any new difficulties arise.  Renold Don, MD [facility]   *This note was dictated using voice recognition software/Dragon.  Despite best  efforts to proofread, errors can occur which can change the meaning.  Any change was purely unintentional.

## 2020-10-27 NOTE — Patient Instructions (Signed)
We are going  to order breathing tests  I am giving you a trial of Trelegy Ellipta 1 inhalation daily.  Stop the Advair.   We are seeing you in follow-up in 3 months time call sooner should any new problems arise.

## 2020-11-07 ENCOUNTER — Telehealth: Payer: Self-pay | Admitting: Pulmonary Disease

## 2020-11-07 NOTE — Telephone Encounter (Addendum)
Called and spoke to patient.  Patient would like to switch back to wixela. Trelegy caused palpations and stomach pain.  Last took trelegy yesterday.  Advised patient to hold trelegy.  She will resume wixela today.   Dr. Patsey Berthold, please advise. Thanks  Patient is okay with waiting until 11/09/2020 for a response.

## 2020-11-09 MED ORDER — FLUTICASONE-SALMETEROL 250-50 MCG/DOSE IN AEPB: 1.0000 | INHALATION_SPRAY | Freq: Two times a day (BID) | RESPIRATORY_TRACT | 3 refills | Status: DC

## 2020-11-09 NOTE — Telephone Encounter (Signed)
Okay for Wixela 250/50, 1 puff twice a day.

## 2020-11-09 NOTE — Telephone Encounter (Signed)
90 day supply of Wixela 250mg  has been sent to preferred pharmacy.  Patient is aware and voiced her understanding.  Nothing further needed at this time.

## 2021-01-02 ENCOUNTER — Encounter: Payer: Self-pay | Admitting: Pulmonary Disease

## 2021-05-15 ENCOUNTER — Other Ambulatory Visit: Payer: Self-pay | Admitting: Family Medicine

## 2021-05-15 ENCOUNTER — Other Ambulatory Visit: Payer: Self-pay | Admitting: Internal Medicine

## 2021-05-15 DIAGNOSIS — Z1231 Encounter for screening mammogram for malignant neoplasm of breast: Secondary | ICD-10-CM

## 2021-05-17 ENCOUNTER — Other Ambulatory Visit: Payer: Self-pay | Admitting: Physician Assistant

## 2021-05-17 DIAGNOSIS — N644 Mastodynia: Secondary | ICD-10-CM

## 2021-05-25 ENCOUNTER — Other Ambulatory Visit: Payer: Self-pay | Admitting: Physician Assistant

## 2021-05-25 DIAGNOSIS — N644 Mastodynia: Secondary | ICD-10-CM

## 2021-05-31 ENCOUNTER — Ambulatory Visit
Admission: RE | Admit: 2021-05-31 | Discharge: 2021-05-31 | Disposition: A | Discharge status: Home / Self Care | Payer: Medicare Other | Source: Ambulatory Visit | Attending: Physician Assistant | Admitting: Physician Assistant

## 2021-05-31 ENCOUNTER — Other Ambulatory Visit: Payer: Self-pay

## 2021-05-31 DIAGNOSIS — N644 Mastodynia: Secondary | ICD-10-CM

## 2021-05-31 IMAGING — MG DIGITAL DIAGNOSTIC BILAT W/ TOMO W/ CAD
8 of 14 series · 8 of 40 positions shown · non-contrast
Comparison: Previous exam(s).

CLINICAL DATA: Focal LEFT breast pain.

EXAM:
DIGITAL DIAGNOSTIC BILATERAL MAMMOGRAM WITH TOMOSYNTHESIS AND CAD;
ULTRASOUND LEFT BREAST LIMITED
TECHNIQUE: Bilateral digital diagnostic mammography and breast tomosynthesis
was performed. The images were evaluated with computer-aided
detection.; Targeted ultrasound examination of the left breast was
performed.

[L TAN synth-2D]
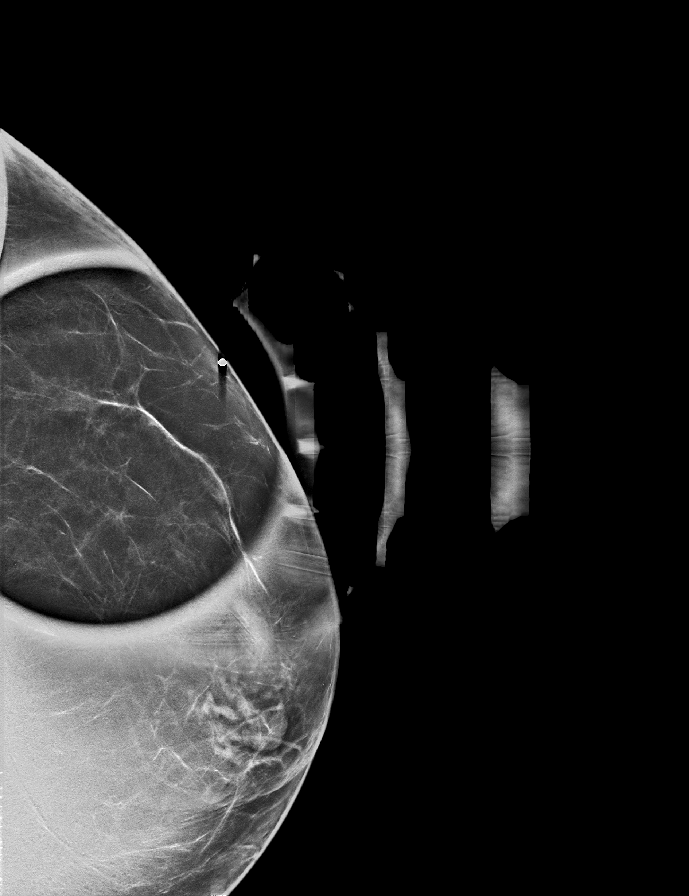

[R MLO synth-2D (1 of 2)]
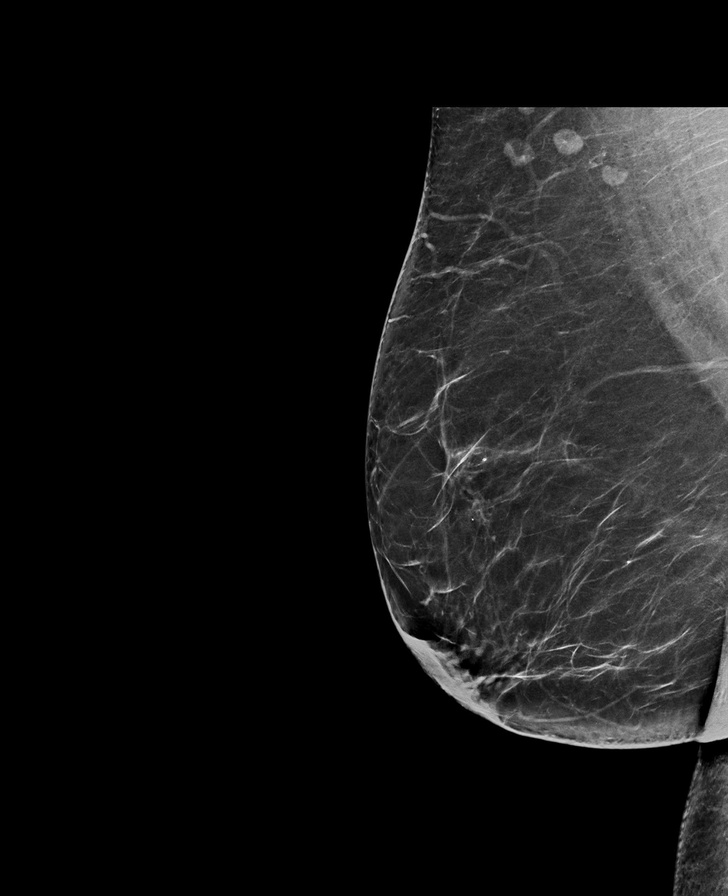

[R CC synth-2D (1 of 2)]
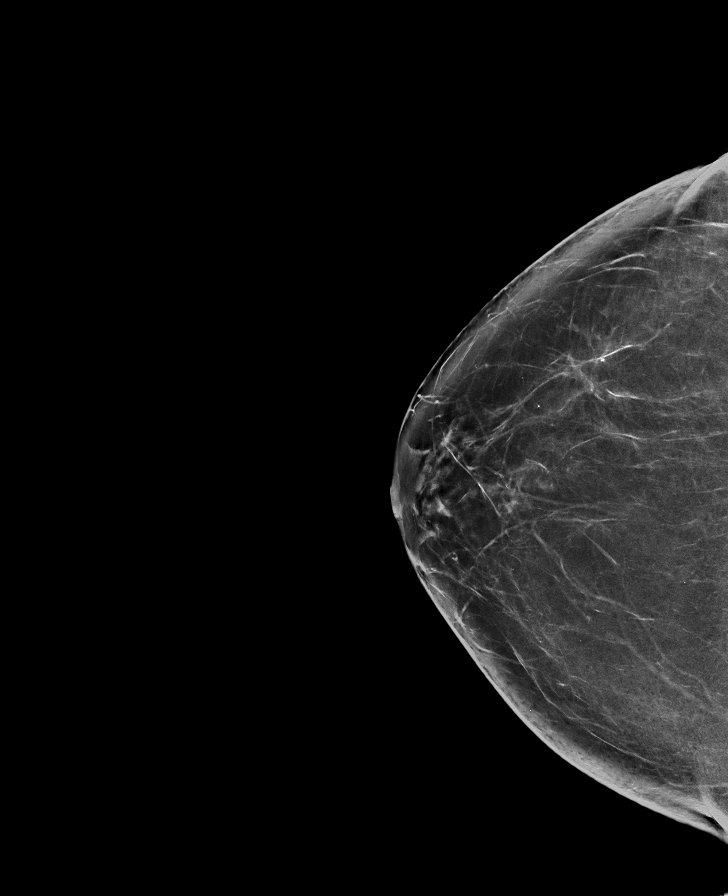

[R CC synth-2D (2 of 2)]
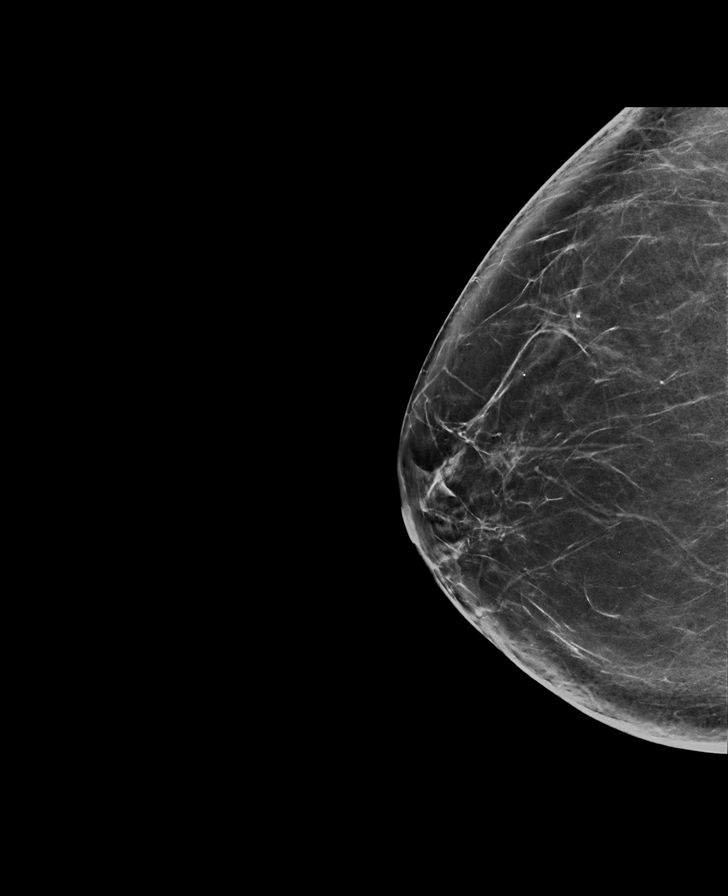

[L MLO synth-2D]
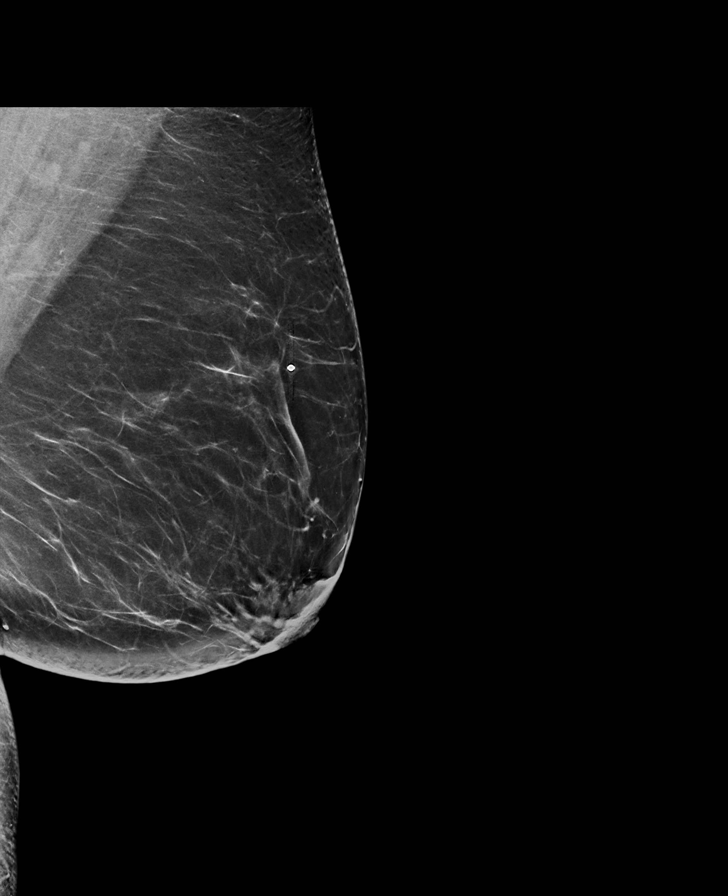

[R MLO synth-2D (2 of 2)]
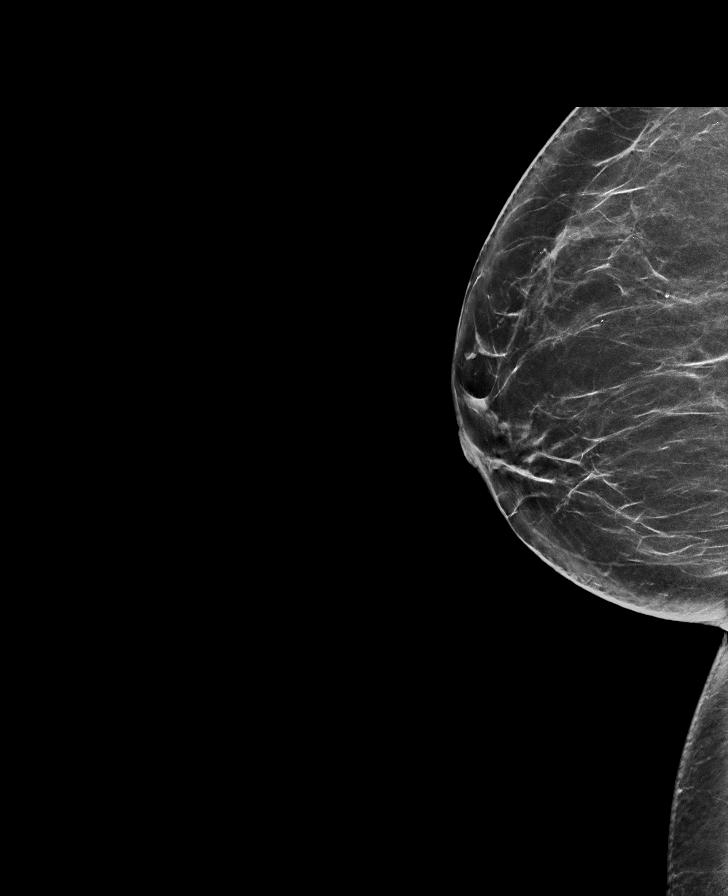

[L CC synth-2D]
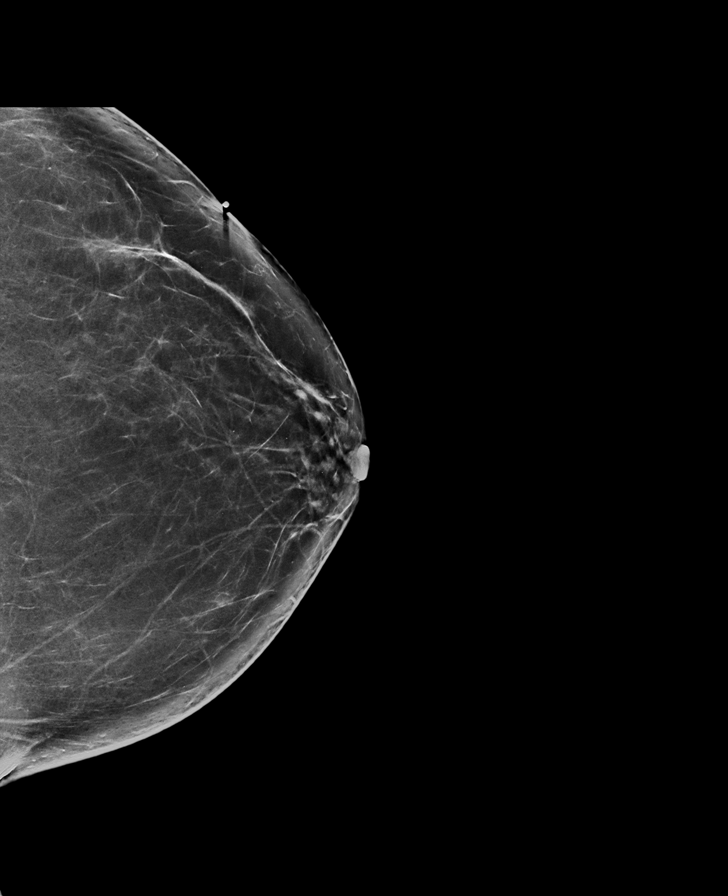

[R MLO tomo · tomo slice 37/73.0]
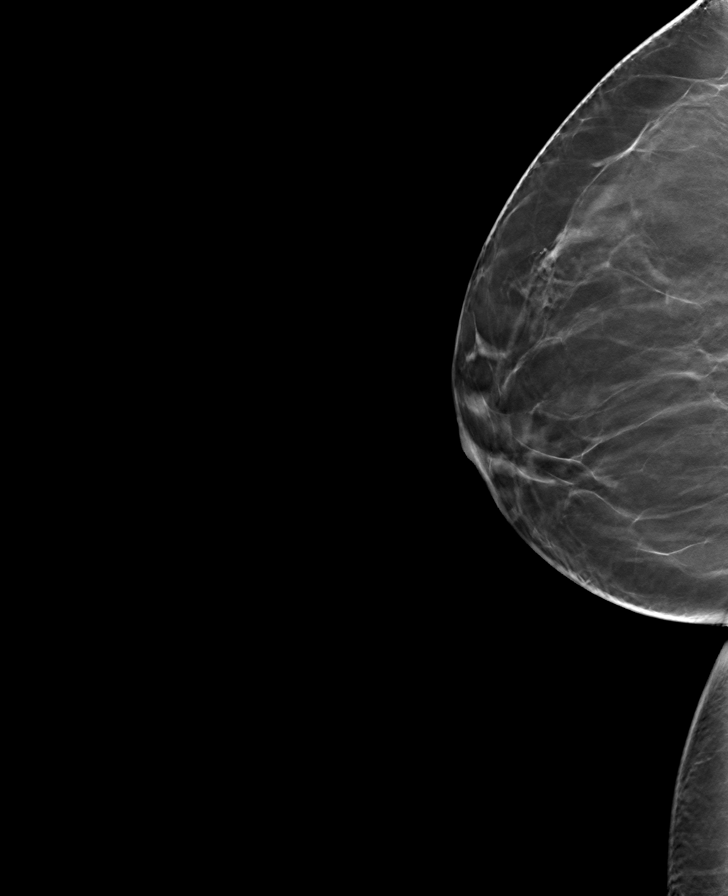

[8 of 40 positions shown; findings below may reference images not displayed]

ACR Breast Density Category b: There are scattered areas of
fibroglandular density.
FINDINGS: Spot compression tomosynthesis views were obtained over the area of
focal pain in the LEFT breast. No suspicious mammographic finding is
identified in this area. No suspicious mass, microcalcification, or
other finding is identified in either breast.

On physical exam, no suspicious mass is appreciated.

Targeted LEFT breast ultrasound was performed in the area of pain at
the upper outer and outer breast. No suspicious solid or cystic mass
is identified. No findings to explain the patient's symptoms.
IMPRESSION: 1. No mammographic or sonographic evidence of malignancy at the site
of painful concern in the LEFT breast. Any further workup of the
patient's symptoms should be based on the clinical assessment.
Recommend routine annual screening mammogram in 1 year.
2. No mammographic evidence of malignancy bilaterally.

RECOMMENDATION:
Screening mammogram in one year.(Code:[01])

I have discussed the findings and recommendations with the patient.
If applicable, a reminder letter will be sent to the patient
regarding the next appointment.

BI-RADS CATEGORY  1: Negative.

## 2021-09-25 ENCOUNTER — Other Ambulatory Visit (HOSPITAL_COMMUNITY): Payer: Self-pay | Admitting: Cardiology

## 2021-09-25 ENCOUNTER — Other Ambulatory Visit: Payer: Self-pay | Admitting: Cardiology

## 2021-09-25 DIAGNOSIS — R079 Chest pain, unspecified: Secondary | ICD-10-CM

## 2021-10-09 ENCOUNTER — Encounter (HOSPITAL_COMMUNITY): Payer: Medicare Other

## 2021-10-13 ENCOUNTER — Other Ambulatory Visit: Payer: Self-pay

## 2021-10-13 ENCOUNTER — Encounter (HOSPITAL_COMMUNITY)
Admission: RE | Admit: 2021-10-13 | Discharge: 2021-10-13 | Disposition: A | Discharge status: Home / Self Care | Payer: Medicare Other | Source: Ambulatory Visit | Attending: Cardiology | Admitting: Cardiology

## 2021-10-13 DIAGNOSIS — R079 Chest pain, unspecified: Secondary | ICD-10-CM | POA: Diagnosis present

## 2021-10-13 MED ORDER — TECHNETIUM TC 99M TETROFOSMIN IV KIT
30.8000 | PACK | Freq: Once | INTRAVENOUS | Status: AC | PRN
  Administered 2021-10-13: 30.8 via INTRAVENOUS

## 2021-10-13 MED ORDER — REGADENOSON 0.4 MG/5ML IV SOLN: 0.4000 mg | Freq: Once | INTRAVENOUS | Status: AC

## 2021-10-13 MED ORDER — TECHNETIUM TC 99M TETROFOSMIN IV KIT
10.6000 | PACK | Freq: Once | INTRAVENOUS | Status: AC | PRN
  Administered 2021-10-13: 10.6 via INTRAVENOUS

## 2021-10-13 MED ORDER — REGADENOSON 0.4 MG/5ML IV SOLN
INTRAVENOUS | Status: AC
  Administered 2021-10-13: 0.4 mg via INTRAVENOUS
  Filled 2021-10-13: qty 5

## 2021-11-08 NOTE — Congregational Nurse Program (Signed)
°  Dept: [facility] Nurse Program Note  Date of Encounter: 11/08/2021   Came to clinic for blood pressure check and complaint of swollen upper right eyelid.  BP 120/82, pulse 70, O2 sat 98%.  States that eyelid has been swollen for a day or two and is painful.  Upper right eyelid is red and swollen, no obvious drainage and sclera is not red.  Recommended application of warm, moist compresses to the right eye and to call her primary MD in the AM if no improvement or eye becomes more swollen or any drainage is present.  Past Medical History: Past Medical History:  Diagnosis Date   Anxiety    Atypical chest pain    COPD (chronic obstructive pulmonary disease) ([facility])    Coronary artery disease, non-occlusive    a. LHC 6/08 EF 55-60%, 20-30% proximal LAD, LIs CFX, LIs RCA ([facility]); b. ETT-myoview 11/10, 3' stopped due to fatigue and chest tightness, EF normal, probably normal perfusion images with minimal soft tissue attenuation   Diastolic dysfunction    a. echo 2010: EF 60-65%, GR1DD, no AI, trivial MR, LA nl   GERD (gastroesophageal reflux disease)    Hyperglycemia    Hyperlipidemia    Hypertension     Encounter Details:

## 2021-11-21 ENCOUNTER — Other Ambulatory Visit: Payer: Self-pay

## 2021-11-21 ENCOUNTER — Ambulatory Visit: Payer: Medicare Other | Admitting: Gastroenterology

## 2021-12-13 NOTE — Congregational Nurse Program (Signed)
?  Dept: [phone] ? ? ?Congregational Nurse Program Note ? ?Date of Encounter: 12/13/2021 ? ? ?Clinic visit for blood pressure check, BP 120/78, pulse 70, O2 sat 98%.  States a niece died on 2022/12/20, funeral will be this weekend. Offered condolences and given dailey devotional booklet.  ?Past Medical History: ?Past Medical History:  ?Diagnosis Date  ? Anxiety   ? Atypical chest pain   ? COPD (chronic obstructive pulmonary disease) ([place])   ? Coronary artery disease, non-occlusive   ? a. LHC 6/08 EF 55-60%, 20-30% proximal LAD, LIs CFX, LIs RCA ([facility]); b. ETT-myoview 11/10, 3' stopped due to fatigue and chest tightness, EF normal, probably normal perfusion images with minimal soft tissue attenuation  ? Diastolic dysfunction   ? a. echo 2010: EF 60-65%, GR1DD, no AI, trivial MR, LA nl  ? GERD (gastroesophageal reflux disease)   ? Hyperglycemia   ? Hyperlipidemia   ? Hypertension   ? ? ?Encounter Details: ? ? ? ? ?

## 2022-01-18 ENCOUNTER — Encounter: Payer: Self-pay | Admitting: Internal Medicine

## 2022-01-18 ENCOUNTER — Ambulatory Visit (INDEPENDENT_AMBULATORY_CARE_PROVIDER_SITE_OTHER): Payer: Medicare Other | Admitting: Internal Medicine

## 2022-01-18 VITALS — BP 122/68 | HR 91 | Temp 98.3°F | Resp 16 | Ht 65.0 in | Wt 210.7 lb

## 2022-01-18 DIAGNOSIS — J302 Other seasonal allergic rhinitis: Secondary | ICD-10-CM

## 2022-01-18 DIAGNOSIS — I2510 Atherosclerotic heart disease of native coronary artery without angina pectoris: Secondary | ICD-10-CM

## 2022-01-18 DIAGNOSIS — K319 Disease of stomach and duodenum, unspecified: Secondary | ICD-10-CM | POA: Insufficient documentation

## 2022-01-18 DIAGNOSIS — I5189 Other ill-defined heart diseases: Secondary | ICD-10-CM

## 2022-01-18 DIAGNOSIS — F419 Anxiety disorder, unspecified: Secondary | ICD-10-CM | POA: Insufficient documentation

## 2022-01-18 DIAGNOSIS — I10 Essential (primary) hypertension: Secondary | ICD-10-CM

## 2022-01-18 DIAGNOSIS — K219 Gastro-esophageal reflux disease without esophagitis: Secondary | ICD-10-CM

## 2022-01-18 DIAGNOSIS — E782 Mixed hyperlipidemia: Secondary | ICD-10-CM | POA: Diagnosis not present

## 2022-01-18 DIAGNOSIS — J4520 Mild intermittent asthma, uncomplicated: Secondary | ICD-10-CM

## 2022-01-18 DIAGNOSIS — R739 Hyperglycemia, unspecified: Secondary | ICD-10-CM

## 2022-01-18 DIAGNOSIS — Z1159 Encounter for screening for other viral diseases: Secondary | ICD-10-CM

## 2022-01-18 MED ORDER — FLUTICASONE PROPIONATE 50 MCG/ACT NA SUSP: 1.0000 | Freq: Every day | NASAL | 1 refills | Status: DC

## 2022-01-18 MED ORDER — HYDROXYZINE HCL 10 MG PO TABS
10.0000 mg | ORAL_TABLET | Freq: Every day | ORAL | 1 refills | Status: DC | PRN
Start: 2022-01-18 — End: 2022-06-06

## 2022-01-18 MED ORDER — ALBUTEROL SULFATE HFA 108 (90 BASE) MCG/ACT IN AERS: 2.0000 | INHALATION_SPRAY | Freq: Four times a day (QID) | RESPIRATORY_TRACT | 10 refills | Status: DC | PRN

## 2022-01-18 MED ORDER — SIMVASTATIN 40 MG PO TABS: 40.0000 mg | ORAL_TABLET | Freq: Every day | ORAL | 1 refills | Status: DC

## 2022-01-18 MED ORDER — PANTOPRAZOLE SODIUM 40 MG PO TBEC: 40.0000 mg | DELAYED_RELEASE_TABLET | Freq: Every day | ORAL | 3 refills | Status: DC

## 2022-01-18 MED ORDER — HYDRALAZINE HCL 10 MG PO TABS: 10.0000 mg | ORAL_TABLET | ORAL | 0 refills | Status: DC | PRN

## 2022-01-18 MED ORDER — METOPROLOL SUCCINATE ER 25 MG PO TB24: 25.0000 mg | ORAL_TABLET | Freq: Every day | ORAL | 1 refills | Status: DC

## 2022-01-18 NOTE — Assessment & Plan Note (Signed)
Not well controlled.  Discussed avoiding trigger foods as well as avoiding laying down immediately after eating.  Discontinue Prilosec, start Protonix 40 mg daily. ?

## 2022-01-18 NOTE — Assessment & Plan Note (Signed)
Exacerbated.  We did discuss that I will not prescribe benzodiazepines at this office.  We will try hydralazine as needed for anxiety. ?

## 2022-01-18 NOTE — Assessment & Plan Note (Signed)
Stable.  Following with cardiology, Dr. Rockey Situ.  Is on statin and aspirin.  Recheck lipid panel today. ?

## 2022-01-18 NOTE — Assessment & Plan Note (Signed)
Stable.  Blood pressure at goal today.  Continue metoprolol 25 mg daily, refills placed today. ?

## 2022-01-18 NOTE — Patient Instructions (Addendum)
It was great seeing you today! ? ?Plan discussed at today's visit: ?-Blood work ordered today, results will be uploaded to [facility].  ?-Medications refilled ?-New stomach medicine sent to pharmacy, stop Prilosec and start Protonix 40 mg ?-New rescue inhaler sent as well to use as needed ?-Call [facility] to schedule follow up appointment ?-GI appointment on 5/31 ?-Anxiety medication sent - take as needed but might make you sleepy so do not drive after taking it  ? ?Follow up in: 6 months  ? ?Take care and let us know if you have any questions or concerns prior to your next visit. ? ?Dr. Rosana Berger ? ?

## 2022-01-18 NOTE — Assessment & Plan Note (Signed)
Had been following with [facility], last note reviewed from 10/27/2020.  Continue Wixela twice daily and albuterol as needed.  Rescue inhaler refilled today.  No respiratory symptoms present today. ?

## 2022-01-18 NOTE — Assessment & Plan Note (Signed)
Recheck glucose and A1c today. ?

## 2022-01-18 NOTE — Assessment & Plan Note (Signed)
Stable on Flonase.  Refilled today. ?

## 2022-01-18 NOTE — Progress Notes (Signed)
? ?New Patient Office Visit ? ?Subjective   ? ?Patient ID: Kellie Phelps, female    DOB: Mar 24, 1946  Age: 76 y.o. MRN: [id number] ? ?CC:  ?Chief Complaint  ?Patient presents with  ? Establish Care  ? ? ?HPI ?Kellie Phelps presents to establish care. ? ?Hypertension/Diastolic HF: ?-Medications: Metoprolol 25 mg, Nitroglycerin PRN ?-Patient is compliant with above medications and reports no side effects. ?-Checking BP at home (average): 120-130/60-70 ?-Denies any SOB, CP, vision changes, LE edema or symptoms of hypotension ?-Follows with cardiology in [place], just had stress test on 10/13/21, EF 62% with small reversible defect of the anteroseptal wall in the apical and mid segments but otherwise normal.  ?-Last echo 05/12/2019 showing EF 60-65% with left ventricular diastolic impaired relaxation.  ? ?HLD/CAD: ?-Medications: Zocor 40 mg, aspirin 81 mg, fish oil  ?-Patient is compliant with above medications and reports no side effects.  ?-Last lipid panel: Lipid Panel  ?   ?Component Value Date/Time  ? CHOL 155 03/31/2015 1439  ? TRIG 217 (H) 03/31/2015 1439  ? HDL 45 03/31/2015 1439  ? CHOLHDL 3.4 03/31/2015 1439  ? CHOLHDL 5 10/17/2012 0946  ? VLDL 27.6 10/17/2012 0946  ? [place] 67 03/31/2015 1439  ? LABVLDL 43 (H) 03/31/2015 1439  ? ? ?COPD/Asthma: ?-Following with [facility], last seen 10/27/20, note reviewed  ?-COPD status: controlled ?-Current medications: Wixela twice a day, Albuterol PRN ?-Satisfied with current treatment?: yes ?-Oxygen use: no ?-Dyspnea frequency: with exertion occasionally ?-Cough frequency: no ?-Rescue inhaler frequency:  not recently, out of Albuterol  ?-Limitation of activity: no ?-Productive cough: no ?-Last Spirometry/PFTs: 2019 ?-Pneumovax: Not up to Date, politely declines ?-Influenza: Not up to Date ? ?GERD: ?-Currently on Prilosec 20 mg daily, but it is not working ?-Still having reflux symptoms, epigastric pain since eating at [employer] this  weekend. No nausea/vomiting. Appetite good, weight stable ?-GI appointment scheduled for 5/31 ? ?Glaucoma: ?-Currently on Dorzolamide 2% ophthalmic solution, following with Dr. Gloriann Loan  ?-Getting ready to have cataract surgery 5/15 in [place] ? ?Seasonal Allergies: ?-Currently using Flonase, doing well ? ?Anxiety: ?-Duration:exacerbated ?-Anxious mood: yes  ?-Excessive worrying: yes ?-Irritability: yes  ?-Sweating: no ?-Nausea: yes ?-Panic attacks: yes ?-Had been on Diazepam in the past, discussed how this medication would not be prescribed here but we can try something else ? ? ?  01/18/2022  ?  1:06 PM  ?Depression screen PHQ 2/9  ?Decreased Interest 0  ?Down, Depressed, Hopeless 1  ?PHQ - 2 Score 1  ?Altered sleeping 0  ?Tired, decreased energy 0  ?Change in appetite 0  ?Feeling bad or failure about yourself  0  ?Trouble concentrating 0  ?Moving slowly or fidgety/restless 0  ?Suicidal thoughts 0  ?PHQ-9 Score 1  ?Difficult doing work/chores Not difficult at all  ? ? ?Health Maintenance: ?-Blood work due ?-Mammogram 9/22, [place]-1 ?-Colon cancer screening: politely declines  ? ? ?Outpatient Encounter Medications as of 01/18/2022  ?Medication Sig  ? aspirin 81 MG tablet Take 81 mg by mouth daily.  ? Calcium Carbonate-Vitamin D 600-200 MG-UNIT TABS Take by mouth.  ? dorzolamide (TRUSOPT) 2 % ophthalmic solution   ? fluticasone (FLONASE) 50 MCG/ACT nasal spray Place 1 spray into both nostrils daily.  ? Fluticasone-Salmeterol (WIXELA INHUB) 250-50 MCG/DOSE AEPB Inhale 1 puff into the lungs in the morning and at bedtime.  ? metoprolol succinate (TOPROL-XL) 25 MG 24 hr tablet Take 25 mg by mouth daily.  ? Multiple Vitamins-Minerals (ONE-A-DAY WOMENS 50 PLUS  PO) Take by mouth daily.  ? nitroGLYCERIN (NITROSTAT) 0.4 MG SL tablet Place under the tongue.  ? omeprazole (PRILOSEC) 20 MG capsule Take 1 capsule (20 mg total) by mouth 2 (two) times daily before a meal.  ? simvastatin (ZOCOR) 40 MG tablet Take 1 tablet (40 mg total)  by mouth daily.  ? VYZULTA 0.024 % SOLN   ? albuterol (PROVENTIL) (2.5 MG/3ML) 0.083% nebulizer solution Take 3 mLs (2.5 mg total) by nebulization every 4 (four) hours as needed for wheezing or shortness of breath.  ? Omega-3 Fatty Acids (FISH OIL) 1000 MG CAPS 1,000 mg. Take two tablets by mouth daily.  ? [DISCONTINUED] albuterol (PROAIR HFA) 108 (90 Base) MCG/ACT inhaler Inhale 2 puffs into the lungs every 6 (six) hours as needed. Wheezing or shortness of breath.  ? [DISCONTINUED] DEXILANT 60 MG capsule Take 1 capsule by mouth daily.  ? [DISCONTINUED] Fluticasone-Umeclidin-Vilant (TRELEGY ELLIPTA) 200-62.5-25 MCG/INH AEPB Inhale 1 puff into the lungs daily.  ? [DISCONTINUED] olmesartan-hydrochlorothiazide (BENICAR HCT) 20-12.5 MG tablet TAKE 1 TABLET BY MOUTH DAILY  ? [DISCONTINUED] ROCKLATAN 0.02-0.005 % SOLN Apply 1 drop to eye at bedtime.  ? ?No facility-administered encounter medications on file as of 01/18/2022.  ? ? ?Past Medical History:  ?Diagnosis Date  ? Allergy   ? Anxiety   ? Asthma   ? Atypical chest pain   ? Cataract   ? COPD (chronic obstructive pulmonary disease) ([place])   ? Coronary artery disease, non-occlusive   ? a. LHC 6/08 EF 55-60%, 20-30% proximal LAD, LIs CFX, LIs RCA (Harw[facility]); b. ETT-myoview 11/10, 3' stopped due to fatigue and chest tightness, EF normal, probably normal perfusion images with minimal soft tissue attenuation  ? Diastolic dysfunction   ? a. echo 2010: EF 60-65%, GR1DD, no AI, trivial MR, LA nl  ? GERD (gastroesophageal reflux disease)   ? Glaucoma   ? Hyperglycemia   ? Hyperlipidemia   ? Hypertension   ? ? ?Past Surgical History:  ?Procedure Laterality Date  ? TONSILLECTOMY    ? ? ?Family History  ?Problem Relation Age of Onset  ? Uterine cancer Mother   ? Heart attack Father   ? Pulmonary embolism Brother   ? Hepatitis Brother   ? Coronary artery disease Brother   ? Coronary artery disease Other   ?     Aunt  ? ? ?Social History  ? ?Socioeconomic History  ? Marital status:  Single  ?  Spouse name: Not on file  ? Number of children: Not on file  ? Years of education: Not on file  ? Highest education level: Not on file  ?Occupational History  ? Occupation: Retired  ?  Employer: RETIRED  ?Tobacco Use  ? Smoking status: Former  ?  Packs/day: 0.50  ?  Years: 5.00  ?  Pack years: 2.50  ?  Types: Cigarettes  ?  Quit date: 09/17/1978  ?  Years since quitting: 43.3  ? Smokeless tobacco: Never  ? Tobacco comments:  ?  unsure how long she smoked for--10/27/2020  ?Vaping Use  ? Vaping Use: Never used  ?Substance and Sexual Activity  ? Alcohol use: No  ? Drug use: No  ? Sexual activity: Not Currently  ?  Partners: Male  ?  Birth control/protection: Post-menopausal  ?Other Topics Concern  ? Not on file  ?Social History Narrative  ? Divorced  ? Lives alone  ? No children  ? Does not get regular exercise  ? ?Social Determinants of Health  ? ?  Financial Resource Strain: Not on file  ?Food Insecurity: Not on file  ?Transportation Needs: Not on file  ?Physical Activity: Not on file  ?Stress: Not on file  ?Social Connections: Not on file  ?Intimate Partner Violence: Not on file  ? ? ?Review of Systems  ?Constitutional:  Negative for chills and fever.  ?Respiratory:  Positive for shortness of breath. Negative for cough and wheezing.   ?Cardiovascular:  Negative for chest pain.  ?Gastrointestinal:  Positive for abdominal pain and heartburn. Negative for constipation, diarrhea, nausea and vomiting.  ?Genitourinary:  Negative for dysuria and hematuria.  ?Neurological:  Negative for dizziness and headaches.  ?Psychiatric/Behavioral:  The patient is nervous/anxious.   ? ?  ? ? ?Objective   ? ?BP 122/68   Pulse 91   Temp 98.3 ?F (36.8 ?C)   Resp 16   Ht '5\' 5"'$  (1.651 m)   Wt 210 lb 11.2 oz (95.6 kg)   SpO2 96%   BMI 35.06 kg/m?  ? ?Physical Exam ?Constitutional:   ?   Appearance: Normal appearance.  ?HENT:  ?   Head: Normocephalic and atraumatic.  ?   Mouth/Throat:  ?   Mouth: Mucous membranes are moist.  ?    Pharynx: Oropharynx is clear.  ?Eyes:  ?   Conjunctiva/sclera: Conjunctivae normal.  ?Cardiovascular:  ?   Rate and Rhythm: Normal rate and regular rhythm.  ?Pulmonary:  ?   Effort: Pulmonary effort is normal.  ?

## 2022-01-18 NOTE — Assessment & Plan Note (Signed)
Recheck lipid panel today.  Continue Zocor 40 mg daily, fish oil. ?

## 2022-01-18 NOTE — Assessment & Plan Note (Signed)
Follow with cardiology, Dr. Rockey Situ.  Last echo reviewed from 2020 showing an EF of 60 to 65%.  Recently had a stress test in January of this year showing an EF of 62%. ?

## 2022-01-19 LAB — CBC WITH DIFFERENTIAL/PLATELET
Absolute Monocytes: 554 cells/uL (ref 200–950)
Basophils Absolute: 79 cells/uL (ref 0–200)
Basophils Relative: 1.1 %
Eosinophils Absolute: 130 cells/uL (ref 15–500)
Eosinophils Relative: 1.8 %
HCT: 46.7 % — ABNORMAL HIGH (ref 35.0–45.0)
Hemoglobin: 15.2 g/dL (ref 11.7–15.5)
Lymphs Abs: 2722 cells/uL (ref 850–3900)
MCH: 29.3 pg (ref 27.0–33.0)
MCHC: 32.5 g/dL (ref 32.0–36.0)
MCV: 90.2 fL (ref 80.0–100.0)
MPV: 10.2 fL (ref 7.5–12.5)
Monocytes Relative: 7.7 %
Neutro Abs: 3715 cells/uL (ref 1500–7800)
Neutrophils Relative %: 51.6 %
Platelets: 233 10*3/uL (ref 140–400)
RBC: 5.18 10*6/uL — ABNORMAL HIGH (ref 3.80–5.10)
RDW: 12.5 % (ref 11.0–15.0)
Total Lymphocyte: 37.8 %
WBC: 7.2 10*3/uL (ref 3.8–10.8)

## 2022-01-19 LAB — COMPLETE METABOLIC PANEL WITH GFR
AG Ratio: 1.6 (calc) (ref 1.0–2.5)
ALT: 49 U/L — ABNORMAL HIGH (ref 6–29)
AST: 43 U/L — ABNORMAL HIGH (ref 10–35)
Albumin: 4.2 g/dL (ref 3.6–5.1)
Alkaline phosphatase (APISO): 88 U/L (ref 37–153)
BUN/Creatinine Ratio: 10 (calc) (ref 6–22)
BUN: 11 mg/dL (ref 7–25)
CO2: 28 mmol/L (ref 20–32)
Calcium: 10.1 mg/dL (ref 8.6–10.4)
Chloride: 105 mmol/L (ref 98–110)
Creat: 1.1 mg/dL — ABNORMAL HIGH (ref 0.60–1.00)
Globulin: 2.7 g/dL (calc) (ref 1.9–3.7)
Glucose, Bld: 93 mg/dL (ref 65–99)
Potassium: 4.4 mmol/L (ref 3.5–5.3)
Sodium: 142 mmol/L (ref 135–146)
Total Bilirubin: 0.5 mg/dL (ref 0.2–1.2)
Total Protein: 6.9 g/dL (ref 6.1–8.1)
eGFR: 52 mL/min/{1.73_m2} — ABNORMAL LOW (ref >=60)

## 2022-01-19 LAB — LIPID PANEL
Cholesterol: 143 mg/dL (ref <200)
HDL: 47 mg/dL — ABNORMAL LOW (ref >=50)
LDL Cholesterol (Calc): 75 mg/dL (calc)
Non-HDL Cholesterol (Calc): 96 mg/dL (calc) (ref <130)
Total CHOL/HDL Ratio: 3 (calc) (ref <5.0)
Triglycerides: 128 mg/dL (ref <150)

## 2022-01-19 LAB — HEMOGLOBIN A1C
Hgb A1c MFr Bld: 5.8 % of total Hgb — ABNORMAL HIGH (ref <5.7)
Mean Plasma Glucose: 120 mg/dL
eAG (mmol/L): 6.6 mmol/L

## 2022-01-19 LAB — HEPATITIS C ANTIBODY
Hepatitis C Ab: NONREACTIVE
SIGNAL TO CUT-OFF: 0.14 (ref <1.00)

## 2022-02-14 ENCOUNTER — Other Ambulatory Visit: Payer: Self-pay

## 2022-02-14 ENCOUNTER — Ambulatory Visit: Payer: Medicare Other | Admitting: Gastroenterology

## 2022-02-14 ENCOUNTER — Telehealth: Payer: Self-pay

## 2022-02-14 NOTE — Telephone Encounter (Signed)
Lab results faxed.

## 2022-02-14 NOTE — Telephone Encounter (Signed)
Copied from [place] [id number]. Topic: General - Other >> Feb 14, 2022  1:34 PM McGill, Nelva Bush wrote: Reason for CRM: Pt stated she went to the heart doctor, and he is requesting a copy of recent blood work done by PCP.   Dr. Allegra Lai. Terrence Dupont, MD/[facility]  [address] [place], [place], [place] [address] / [phone]

## 2022-02-20 ENCOUNTER — Ambulatory Visit: Payer: Self-pay | Admitting: Internal Medicine

## 2022-02-28 ENCOUNTER — Telehealth: Payer: Self-pay | Admitting: Internal Medicine

## 2022-02-28 NOTE — Telephone Encounter (Signed)
Copied from [place]. Topic: Medicare AWV >> Feb 28, 2022 10:42 AM Jae Dire wrote: Reason for CRM:   Left message for patient to call back and schedule Medicare Annual Wellness Visit (AWV) in office.   If unable to come into the office for AWV,  please offer to do virtually or by telephone.  No hx of AWV eligible for AWVI per [facility] as of 08/17/2010  Please schedule at anytime with Marianne.      45 minute appointment   Any questions, please call me at [phone]

## 2022-03-06 ENCOUNTER — Other Ambulatory Visit: Payer: Self-pay

## 2022-03-06 ENCOUNTER — Ambulatory Visit (INDEPENDENT_AMBULATORY_CARE_PROVIDER_SITE_OTHER): Payer: Medicare Other | Admitting: Gastroenterology

## 2022-03-06 ENCOUNTER — Encounter: Payer: Self-pay | Admitting: Gastroenterology

## 2022-03-06 VITALS — BP 122/78 | HR 69 | Temp 98.0°F | Ht 65.0 in | Wt 217.4 lb

## 2022-03-06 DIAGNOSIS — K219 Gastro-esophageal reflux disease without esophagitis: Secondary | ICD-10-CM

## 2022-03-06 DIAGNOSIS — Z1211 Encounter for screening for malignant neoplasm of colon: Secondary | ICD-10-CM

## 2022-03-06 MED ORDER — NA SULFATE-K SULFATE-MG SULF 17.5-3.13-1.6 GM/177ML PO SOLN: 354.0000 mL | Freq: Once | ORAL | 0 refills | Status: AC

## 2022-03-06 NOTE — Progress Notes (Signed)
Cephas Darby, MD [address]  [address] [place]  [place], [place] [address]  Main: [phone]  Fax: [phone]    Gastroenterology Consultation  Referring Provider:     Teodora Medici, DO Primary Care Physician:  Teodora Medici, DO Primary Gastroenterologist:  Dr. Bonna Gains Reason for Consultation: Chronic GERD        HPI:   Kellie Phelps is a 76 y.o. female referred by Dr. Teodora Medici, DO  for consultation & management of chronic GERD.  Patient reports that she has been experiencing severe heartburn for few months and chest tightness, as well as food getting stuck.  She was originally evaluated by Dr. Bonna Gains in 11/2019 for upper endoscopy as well as colonoscopy for screening.  However, patient deferred these procedures at that time.  She was on Dexilant at that time.  Recently, her symptoms have worsened during the day as well as at night and has been started on Protonix 40 mg daily before breakfast. She is prediabetic, no evidence of anemia  Patient lives independently, independent of ADLs  NSAIDs: None  Antiplts/Anticoagulants/Anti thrombotics: None  GI Procedures: None  Past Medical History:  Diagnosis Date   Allergy    Anxiety    Asthma    Atypical chest pain    Cataract    COPD (chronic obstructive pulmonary disease) ([facility])    Coronary artery disease, non-occlusive    a. LHC 6/08 EF 55-60%, 20-30% proximal LAD, LIs CFX, LIs RCA (Harwani); b. ETT-myoview 11/10, 3' stopped due to fatigue and chest tightness, EF normal, probably normal perfusion images with minimal soft tissue attenuation   Diastolic dysfunction    a. echo 2010: EF 60-65%, GR1DD, no AI, trivial MR, LA nl   GERD (gastroesophageal reflux disease)    Glaucoma    Hyperglycemia    Hyperlipidemia    Hypertension     Past Surgical History:  Procedure Laterality Date   TONSILLECTOMY       Current Outpatient Medications:    albuterol (PROAIR HFA) 108 (90 Base) MCG/ACT  inhaler, Inhale 2 puffs into the lungs every 6 (six) hours as needed. Wheezing or shortness of breath., Disp: 18 g, Rfl: 10   amLODipine (NORVASC) 5 MG tablet, Take by mouth., Disp: , Rfl:    aspirin 81 MG tablet, Take 81 mg by mouth daily., Disp: , Rfl:    Calcium Carbonate-Vitamin D 600-200 MG-UNIT TABS, Take by mouth., Disp: , Rfl:    dorzolamide (TRUSOPT) 2 % ophthalmic solution, , Disp: , Rfl:    ferrous sulfate 325 (65 FE) MG EC tablet, Take by mouth., Disp: , Rfl:    fluticasone (FLONASE) 50 MCG/ACT nasal spray, Place 1 spray into both nostrils daily., Disp: 15.8 mL, Rfl: 1   Fluticasone-Salmeterol (WIXELA INHUB) 250-50 MCG/DOSE AEPB, Inhale 1 puff into the lungs in the morning and at bedtime., Disp: 180 each, Rfl: 3   gatifloxacin (ZYMAXID) 0.5 % SOLN, SMARTSIG:In Eye(s), Disp: , Rfl:    hydrOXYzine (ATARAX) 10 MG tablet, Take 1 tablet (10 mg total) by mouth daily as needed for anxiety., Disp: 30 tablet, Rfl: 1   ketorolac (ACULAR) 0.5 % ophthalmic solution, SMARTSIG:In Eye(s), Disp: , Rfl:    meloxicam (MOBIC) 15 MG tablet, Take by mouth., Disp: , Rfl:    metoprolol succinate (TOPROL-XL) 25 MG 24 hr tablet, Take 1 tablet (25 mg total) by mouth daily., Disp: 90 tablet, Rfl: 1   Multiple Vitamins-Minerals (ONE-A-DAY WOMENS 50 PLUS PO), Take by mouth daily., Disp: ,  Rfl:    Na Sulfate-K Sulfate-Mg Sulf 17.5-3.13-1.6 GM/177ML SOLN, Take 354 mLs by mouth once for 1 dose., Disp: 354 mL, Rfl: 0   nitroGLYCERIN (NITROSTAT) 0.4 MG SL tablet, Place under the tongue., Disp: , Rfl:    Omega-3 Fatty Acids (FISH OIL) 1000 MG CAPS, 1,000 mg. Take two tablets by mouth daily., Disp: , Rfl:    pantoprazole (PROTONIX) 40 MG tablet, Take 1 tablet (40 mg total) by mouth daily., Disp: 30 tablet, Rfl: 3   prednisoLONE acetate (PRED FORTE) 1 % ophthalmic suspension, SMARTSIG:In Eye(s), Disp: , Rfl:    simvastatin (ZOCOR) 40 MG tablet, Take 1 tablet (40 mg total) by mouth daily., Disp: 90 tablet, Rfl: 1    tiZANidine (ZANAFLEX) 2 MG tablet, Take by mouth., Disp: , Rfl:    VYZULTA 0.024 % SOLN, , Disp: , Rfl:    Family History  Problem Relation Age of Onset   Uterine cancer Mother    Heart attack Father    Pulmonary embolism Brother    Hepatitis Brother    Coronary artery disease Brother    Coronary artery disease Other        Aunt     Social History   Tobacco Use   Smoking status: Former    Packs/day: 0.50    Years: 5.00    Total pack years: 2.50    Types: Cigarettes    Quit date: 09/17/1978    Years since quitting: 43.4   Smokeless tobacco: Never   Tobacco comments:    unsure how long she smoked for--10/27/2020  Vaping Use   Vaping Use: Never used  Substance Use Topics   Alcohol use: No   Drug use: No    Allergies as of 03/06/2022 - Review Complete 03/06/2022  Allergen Reaction Noted   Penicillins Hives     Review of Systems:    All systems reviewed and negative except where noted in HPI.   Physical Exam:  BP 122/78 (BP Location: Left Arm, Patient Position: Sitting, Cuff Size: Normal)   Pulse 69   Temp 98 F (36.7 C) (Oral)   Ht '5\' 5"'$  (1.651 m)   Wt 217 lb 6 oz (98.6 kg)   BMI 36.17 kg/m  No LMP recorded. Patient is postmenopausal.  General:   Alert,  Well-developed, well-nourished, pleasant and cooperative in NAD Head:  Normocephalic and atraumatic. Eyes:  Sclera clear, no icterus.   Conjunctiva pink. Ears:  Normal auditory acuity. Nose:  No deformity, discharge, or lesions. Mouth:  No deformity or lesions,oropharynx pink & moist. Neck:  Supple; no masses or thyromegaly. Lungs:  Respirations even and unlabored.  Clear throughout to auscultation.   No wheezes, crackles, or rhonchi. No acute distress. Heart:  Regular rate and rhythm; no murmurs, clicks, rubs, or gallops. Abdomen:  Normal bowel sounds. Soft, non-tender and non-distended without masses, hepatosplenomegaly or hernias noted.  No guarding or rebound tenderness.   Rectal: Not performed Msk:   Symmetrical without gross deformities. Good, equal movement & strength bilaterally. Pulses:  Normal pulses noted. Extremities:  No clubbing or edema.  No cyanosis. Neurologic:  Alert and oriented x3;  grossly normal neurologically. Skin:  Intact without significant lesions or rashes. No jaundice. Psych:  Alert and cooperative. Normal mood and affect.  Imaging Studies: No abdominal imaging  Assessment and Plan:   Kellie Phelps is a 76 y.o. pleasant female with obesity, BMI 36, COPD, hypertension, hyperlipidemia is seen in consultation for chronic GERD  Chronic GERD with recent exacerbation  Advised patient to increase Protonix 40 mg to twice daily Discussed about antireflux lifestyle, food triggers, information provided Discussed about EGD for further evaluation and patient is agreeable  Colon cancer screening Discussed about colonoscopy and patient is agreeable  I have discussed alternative options, risks & benefits,  which include, but are not limited to, bleeding, infection, perforation,respiratory complication & drug reaction.  The patient agrees with this plan & written consent will be obtained.     Follow up based on the EGD findings   Cephas Darby, MD

## 2022-03-06 NOTE — Patient Instructions (Signed)

## 2022-03-07 ENCOUNTER — Telehealth: Payer: Self-pay

## 2022-03-07 NOTE — Telephone Encounter (Signed)
Patient requested call back to reschedule her colonoscopy w/EGD scheduled with Dr. Marius Ditch 03/14/22.  Her procedures have been rescheduled to Thursday 03/29/22.  Magda Paganini in Endo has been notified of date change.  Referral updated and sent to [facility].  Instructions updated and sent via mychart and hard copy mailed.  Thanks, [place], [place]

## 2022-03-12 ENCOUNTER — Emergency Department
Admission: EM | Admit: 2022-03-12 | Discharge: 2022-03-12 | Disposition: A | Discharge status: Home / Self Care | Payer: Medicare Other | Attending: Emergency Medicine | Admitting: Emergency Medicine

## 2022-03-12 ENCOUNTER — Emergency Department: Payer: Medicare Other

## 2022-03-12 DIAGNOSIS — R079 Chest pain, unspecified: Secondary | ICD-10-CM

## 2022-03-12 DIAGNOSIS — I10 Essential (primary) hypertension: Secondary | ICD-10-CM | POA: Diagnosis not present

## 2022-03-12 DIAGNOSIS — I2510 Atherosclerotic heart disease of native coronary artery without angina pectoris: Secondary | ICD-10-CM | POA: Insufficient documentation

## 2022-03-12 DIAGNOSIS — J449 Chronic obstructive pulmonary disease, unspecified: Secondary | ICD-10-CM | POA: Diagnosis not present

## 2022-03-12 DIAGNOSIS — R0789 Other chest pain: Secondary | ICD-10-CM | POA: Diagnosis present

## 2022-03-12 DIAGNOSIS — R0981 Nasal congestion: Secondary | ICD-10-CM | POA: Diagnosis not present

## 2022-03-12 DIAGNOSIS — J45909 Unspecified asthma, uncomplicated: Secondary | ICD-10-CM | POA: Insufficient documentation

## 2022-03-12 LAB — BASIC METABOLIC PANEL
Anion gap: 6 (ref 5–15)
BUN: 16 mg/dL (ref 8–23)
CO2: 23 mmol/L (ref 22–32)
Calcium: 9 mg/dL (ref 8.9–10.3)
Chloride: 110 mmol/L (ref 98–111)
Creatinine, Ser: 1.03 mg/dL — ABNORMAL HIGH (ref 0.44–1.00)
GFR, Estimated: 56 mL/min — ABNORMAL LOW (ref >60)
Glucose, Bld: 120 mg/dL — ABNORMAL HIGH (ref 70–99)
Potassium: 4.2 mmol/L (ref 3.5–5.1)
Sodium: 139 mmol/L (ref 135–145)

## 2022-03-12 LAB — CBC
HCT: 45 % (ref 36.0–46.0)
Hemoglobin: 14.8 g/dL (ref 12.0–15.0)
MCH: 28.8 pg (ref 26.0–34.0)
MCHC: 32.9 g/dL (ref 30.0–36.0)
MCV: 87.5 fL (ref 80.0–100.0)
Platelets: 227 10*3/uL (ref 150–400)
RBC: 5.14 MIL/uL — ABNORMAL HIGH (ref 3.87–5.11)
RDW: 12.9 % (ref 11.5–15.5)
WBC: 7.3 10*3/uL (ref 4.0–10.5)
nRBC: 0 % (ref 0.0–0.2)

## 2022-03-12 LAB — D-DIMER, QUANTITATIVE: D-Dimer, Quant: 0.45 ug/mL-FEU (ref 0.00–0.50)

## 2022-03-12 LAB — TROPONIN I (HIGH SENSITIVITY)
Troponin I (High Sensitivity): 3 ng/L (ref <18)
Troponin I (High Sensitivity): 4 ng/L (ref <18)

## 2022-03-12 MED ORDER — ACETAMINOPHEN 500 MG PO TABS
1000.0000 mg | ORAL_TABLET | Freq: Once | ORAL | Status: AC
  Administered 2022-03-12: 1000 mg via ORAL
  Filled 2022-03-12: qty 2

## 2022-03-12 NOTE — ED Triage Notes (Signed)
Pt presents via EMS c/o left sided rib pain under the left breast starting today. Reports too ASA x4 PTA. Reports falling in shower yesterday, denies LOC. Seen at Urgent care for back pain and given Rx per EMS for muscle spasms in back. Pt reports intermittent sharp pains under her left breast.

## 2022-03-15 NOTE — Progress Notes (Deleted)
Established Patient Office Visit  Subjective    Patient ID: Kellie Phelps, female    DOB: [birth year]  Age: 76 y.o. MRN: [id number]  CC:  No chief complaint on file.   HPI Kellie Phelps presents for ER follow up.   Discharge Date: 03/12/22 Hospital/facility: [facility] Diagnosis: chest pain Procedures/tests: EKG without abnormalities, troponin levels normal x 2, chest x-ray normal. Labs without significant abnormalities, D-dimer normal.  Consultants: None New medications: None Discontinued medications: None  Discharge instructions:  Follow up on blood pressure - was 146/80 in the ER Status: {Blank multiple:19196::"better","worse","stable","fluctuating"}   Hypertension/Diastolic HF: -Medications: Metoprolol 25 mg, Nitroglycerin PRN -Patient is compliant with above medications and reports no side effects. -Checking BP at home (average): 120-130/60-70 -Denies any SOB, CP, vision changes, LE edema or symptoms of hypotension -Follows with cardiology in [place], just had stress test on 10/13/21, EF 62% with small reversible defect of the anteroseptal wall in the apical and mid segments but otherwise normal.  -Last echo 05/12/2019 showing EF 60-65% with left ventricular diastolic impaired relaxation.   HLD/CAD: -Medications: Zocor 40 mg, aspirin 81 mg, fish oil  -Patient is compliant with above medications and reports no side effects.  -Last lipid panel: Lipid Panel     Component Value Date/Time   CHOL 143 01/18/2022 1350   CHOL 155 03/31/2015 1439   TRIG 128 01/18/2022 1350   HDL 47 (L) 01/18/2022 1350   HDL 45 03/31/2015 1439   CHOLHDL 3.0 01/18/2022 1350   VLDL 27.6 10/17/2012 0946   LDLCALC 75 01/18/2022 1350   LABVLDL 43 (H) 03/31/2015 1439    COPD/Asthma: -Following with [facility], last seen 10/27/20, note reviewed  -COPD status: controlled -Current medications: Wixela twice a day, Albuterol PRN -Satisfied with current treatment?:  yes -Oxygen use: no -Dyspnea frequency: with exertion occasionally -Cough frequency: no -Rescue inhaler frequency:  not recently, out of Albuterol  -Limitation of activity: no -Productive cough: no -Last Spirometry/PFTs: 2019 -Pneumovax: Not up to Date, politely declines -Influenza: Not up to Date  GERD: -Currently on Prilosec 20 mg daily, but it is not working -Still having reflux symptoms, epigastric pain since eating at [employer] this weekend. No nausea/vomiting. Appetite good, weight stable -GI appointment scheduled for 5/31  Glaucoma: -Currently on Dorzolamide 2% ophthalmic solution, following with Dr. Gloriann Loan  -Getting ready to have cataract surgery 5/15 in [place]  Seasonal Allergies: -Currently using Flonase, doing well  Anxiety: -Duration:exacerbated -Anxious mood: yes  -Excessive worrying: yes -Irritability: yes  -Sweating: no -Nausea: yes -Panic attacks: yes -Had been on Diazepam in the past, discussed how this medication would not be prescribed here but we can try something else     01/18/2022    1:06 PM  Depression screen PHQ 2/9  Decreased Interest 0  Down, Depressed, Hopeless 1  PHQ - 2 Score 1  Altered sleeping 0  Tired, decreased energy 0  Change in appetite 0  Feeling bad or failure about yourself  0  Trouble concentrating 0  Moving slowly or fidgety/restless 0  Suicidal thoughts 0  PHQ-9 Score 1  Difficult doing work/chores Not difficult at all    Health Maintenance: -Blood work due -Mammogram 9/22, [place]-1 -Colon cancer screening: politely declines    Outpatient Encounter Medications as of 03/16/2022  Medication Sig   albuterol (PROAIR HFA) 108 (90 Base) MCG/ACT inhaler Inhale 2 puffs into the lungs every 6 (six) hours as needed. Wheezing or shortness of breath.   amLODipine (NORVASC) 5 MG tablet  Take by mouth.   aspirin 81 MG tablet Take 81 mg by mouth daily.   Calcium Carbonate-Vitamin D 600-200 MG-UNIT TABS Take by mouth.    dorzolamide (TRUSOPT) 2 % ophthalmic solution    ferrous sulfate 325 (65 FE) MG EC tablet Take by mouth.   fluticasone (FLONASE) 50 MCG/ACT nasal spray Place 1 spray into both nostrils daily.   Fluticasone-Salmeterol (WIXELA INHUB) 250-50 MCG/DOSE AEPB Inhale 1 puff into the lungs in the morning and at bedtime.   gatifloxacin (ZYMAXID) 0.5 % SOLN SMARTSIG:In Eye(s)   hydrOXYzine (ATARAX) 10 MG tablet Take 1 tablet (10 mg total) by mouth daily as needed for anxiety.   ketorolac (ACULAR) 0.5 % ophthalmic solution SMARTSIG:In Eye(s)   metoprolol succinate (TOPROL-XL) 25 MG 24 hr tablet Take 1 tablet (25 mg total) by mouth daily.   Multiple Vitamins-Minerals (ONE-A-DAY WOMENS 50 PLUS PO) Take by mouth daily.   nitroGLYCERIN (NITROSTAT) 0.4 MG SL tablet Place under the tongue.   Omega-3 Fatty Acids (FISH OIL) 1000 MG CAPS 1,000 mg. Take two tablets by mouth daily.   pantoprazole (PROTONIX) 40 MG tablet Take 1 tablet (40 mg total) by mouth daily.   prednisoLONE acetate (PRED FORTE) 1 % ophthalmic suspension SMARTSIG:In Eye(s)   simvastatin (ZOCOR) 40 MG tablet Take 1 tablet (40 mg total) by mouth daily.   VYZULTA 0.024 % SOLN  (Patient not taking: Reported on 03/06/2022)   No facility-administered encounter medications on file as of 03/16/2022.    Past Medical History:  Diagnosis Date   Allergy    Anxiety    Asthma    Atypical chest pain    Cataract    COPD (chronic obstructive pulmonary disease) (HCC)    Coronary artery disease, non-occlusive    a. LHC 6/08 EF 55-60%, 20-30% proximal LAD, LIs CFX, LIs RCA (Harwani); b. ETT-myoview 11/10, 3' stopped due to fatigue and chest tightness, EF normal, probably normal perfusion images with minimal soft tissue attenuation   Diastolic dysfunction    a. echo 2010: EF 60-65%, GR1DD, no AI, trivial MR, LA nl   GERD (gastroesophageal reflux disease)    Glaucoma    Hyperglycemia    Hyperlipidemia    Hypertension     Past Surgical History:  Procedure  Laterality Date   TONSILLECTOMY      Family History  Problem Relation Age of Onset   Uterine cancer Mother    Heart attack Father    Pulmonary embolism Brother    Hepatitis Brother    Coronary artery disease Brother    Coronary artery disease Other        Aunt    Social History   Socioeconomic History   Marital status: Single    Spouse name: Not on file   Number of children: Not on file   Years of education: Not on file   Highest education level: Not on file  Occupational History   Occupation: Retired    Fish farm manager: RETIRED  Tobacco Use   Smoking status: Former    Packs/day: 0.50    Years: 5.00    Total pack years: 2.50    Types: Cigarettes    Quit date: 09/17/1978    Years since quitting: 43.5   Smokeless tobacco: Never   Tobacco comments:    unsure how long she smoked for--10/27/2020  Vaping Use   Vaping Use: Never used  Substance and Sexual Activity   Alcohol use: No   Drug use: No   Sexual activity: Not Currently  Partners: Male    Birth control/protection: Post-menopausal  Other Topics Concern   Not on file  Social History Narrative   Divorced   Lives alone   No children   Does not get regular exercise   Social Determinants of Health   Financial Resource Strain: Not on file  Food Insecurity: Not on file  Transportation Needs: Not on file  Physical Activity: Not on file  Stress: Not on file  Social Connections: Not on file  Intimate Partner Violence: Not on file    Review of Systems  Constitutional:  Negative for chills and fever.  Respiratory:  Positive for shortness of breath. Negative for cough and wheezing.   Cardiovascular:  Negative for chest pain.  Gastrointestinal:  Positive for abdominal pain and heartburn. Negative for constipation, diarrhea, nausea and vomiting.  Genitourinary:  Negative for dysuria and hematuria.  Neurological:  Negative for dizziness and headaches.  Psychiatric/Behavioral:  The patient is nervous/anxious.          Objective    There were no vitals taken for this visit.  Physical Exam Constitutional:      Appearance: Normal appearance.  HENT:     Head: Normocephalic and atraumatic.     Mouth/Throat:     Mouth: Mucous membranes are moist.     Pharynx: Oropharynx is clear.  Eyes:     Conjunctiva/sclera: Conjunctivae normal.  Cardiovascular:     Rate and Rhythm: Normal rate and regular rhythm.  Pulmonary:     Effort: Pulmonary effort is normal.     Breath sounds: Normal breath sounds.  Abdominal:     General: There is no distension.     Palpations: Abdomen is soft.     Tenderness: There is abdominal tenderness. There is no guarding.     Comments: Epigastric pain to palpation   Musculoskeletal:     Right lower leg: No edema.     Left lower leg: No edema.  Skin:    General: Skin is warm and dry.  Neurological:     General: No focal deficit present.     Mental Status: She is alert. Mental status is at baseline.  Psychiatric:        Mood and Affect: Mood normal.        Behavior: Behavior normal.         Assessment & Plan:   Problem List Items Addressed This Visit       Cardiovascular and Mediastinum   Essential hypertension - Primary    Stable.  Blood pressure at goal today.  Continue metoprolol 25 mg daily, refills placed today. Due for routine labs.        Relevant Medications   metoprolol succinate (TOPROL-XL) 25 MG 24 hr tablet         Other Relevant Orders   CBC w/Diff/Platelet   COMPLETE METABOLIC PANEL WITH GFR   Coronary artery disease, non-occlusive    Stable.  Following with cardiology, Dr. Rockey Situ.  Is on statin and aspirin.  Recheck lipid panel today.       Relevant Medications      simvastatin (ZOCOR) 40 MG tablet      Other Relevant Orders   Lipid Profile     Respiratory   Mild intermittent asthma without complication    Had been following with [place] [facility], last note reviewed from 10/27/2020.  Continue Wixela twice daily and albuterol  as needed.  Rescue inhaler refilled today.  No respiratory symptoms present today.  Relevant Medications   albuterol (PROAIR HFA) 108 (90 Base) MCG/ACT inhaler     Digestive   Gastroesophageal reflux disease    Not well controlled.  Discussed avoiding trigger foods as well as avoiding laying down immediately after eating.  Discontinue Prilosec, start Protonix 40 mg daily.       Relevant Medications   pantoprazole (PROTONIX) 40 MG tablet     Other   Hyperlipidemia    Recheck lipid panel today.  Continue Zocor 40 mg daily, fish oil.       Relevant Medications      simvastatin (ZOCOR) 40 MG tablet      Other Relevant Orders   Lipid Profile   Diastolic dysfunction    Follow with cardiology, Dr. Rockey Situ.  Last echo reviewed from 2020 showing an EF of 60 to 65%.  Recently had a stress test in January of this year showing an EF of 62%.       Hyperglycemia    Recheck glucose and A1c today.       Relevant Orders   HgB A1c   Seasonal allergies    Stable on Flonase.  Refilled today.       Relevant Medications   fluticasone (FLONASE) 50 MCG/ACT nasal spray   Anxiety    Exacerbated.  We did discuss that I will not prescribe benzodiazepines at this office.  We will try hydralazine as needed for anxiety.             Other Visit Diagnoses     Encounter for hepatitis C screening test for low risk patient       Relevant Orders   Hepatitis C Antibody       No follow-ups on file.   Teodora Medici, DO   HPI

## 2022-03-16 ENCOUNTER — Ambulatory Visit: Payer: Medicare Other | Admitting: Internal Medicine

## 2022-03-29 ENCOUNTER — Ambulatory Visit: Admission: RE | Admit: 2022-03-29 | Payer: Medicare Other | Source: Ambulatory Visit | Admitting: Gastroenterology

## 2022-03-29 ENCOUNTER — Encounter: Admission: RE | Payer: Self-pay | Source: Ambulatory Visit

## 2022-03-29 SURGERY — COLONOSCOPY WITH PROPOFOL
Anesthesia: General

## 2022-04-04 NOTE — Congregational Nurse Program (Signed)
  Dept: [phone]   Congregational Nurse Program Note  Date of Encounter: 04/04/2022  Review blood pressures take at her apartment and discussed types of foods that are lower in sodium.  States that she is taking blood pressure medicine as prescribed and taking her blood pressure each morning Past Medical History: Past Medical History:  Diagnosis Date   Allergy    Anxiety    Asthma    Atypical chest pain    Cataract    COPD (chronic obstructive pulmonary disease) (HCC)    Coronary artery disease, non-occlusive    a. LHC 6/08 EF 55-60%, 20-30% proximal LAD, LIs CFX, LIs RCA (Harwani); b. ETT-myoview 11/10, 3' stopped due to fatigue and chest tightness, EF normal, probably normal perfusion images with minimal soft tissue attenuation   Diastolic dysfunction    a. echo 2010: EF 60-65%, GR1DD, no AI, trivial MR, LA nl   GERD (gastroesophageal reflux disease)    Glaucoma    Hyperglycemia    Hyperlipidemia    Hypertension     Encounter Details:  CNP Questionnaire - 04/04/22 0950       Questionnaire   Do you give verbal consent to treat you today? Yes    Location Patient [place] or Organization    Patient Status Unknown    Insurance Medicare    Insurance Referral N/A    Medication N/A    Medical Provider Yes    Screening Referrals N/A    Medical Referral N/A    Medical Appointment Made N/A    Food N/A    Transportation N/A    Housing/Utilities N/A    Interpersonal Safety N/A    Intervention Counsel;Support    ED Visit Averted N/A    Life-Saving Intervention Made N/A

## 2022-04-18 ENCOUNTER — Other Ambulatory Visit: Payer: Self-pay

## 2022-04-18 ENCOUNTER — Telehealth: Payer: Self-pay

## 2022-04-18 DIAGNOSIS — K219 Gastro-esophageal reflux disease without esophagitis: Secondary | ICD-10-CM

## 2022-04-18 DIAGNOSIS — R739 Hyperglycemia, unspecified: Secondary | ICD-10-CM

## 2022-04-18 LAB — GLUCOSE, POCT (MANUAL RESULT ENTRY): POC Glucose: 127 mg/dl — AB (ref 70–99)

## 2022-04-18 NOTE — Telephone Encounter (Signed)
Returned patients call back to schedule Colonoscopy w/EGD.  She said she only wanted to have the EGD performed because she was having problems with her throat.  I informed her that both procedures would be done at the same time but she said she didn't want to have the colonoscopy.  EGD for GERD has been scheduled for 05/16/22 at [facility] with Dr. Marius Ditch.  Thanks, [place], [place]

## 2022-04-18 NOTE — Congregational Nurse Program (Signed)
  Dept: [place] Nurse Program Note  Date of Encounter: 04/18/2022  Clinic visit for blood pressure and blood sugar check.  BP 124/76, pulse 86, resp 18, O2 Sat 98%.  Blood sugar 127 approximately 2 hours after lunch.  Complaining of pain upper sternum and has history of gastric reflux, educated on possibility that pain in this area could be from the reflux.  Recommended keeping MD appointment scheduled for next week and to call PCP if any nausea, vomiting, increase in pain or difficulty swallowing. Past Medical History: Past Medical History:  Diagnosis Date   Allergy    Anxiety    Asthma    Atypical chest pain    Cataract    COPD (chronic obstructive pulmonary disease) (HCC)    Coronary artery disease, non-occlusive    a. LHC 6/08 EF 55-60%, 20-30% proximal LAD, LIs CFX, LIs RCA (Harwani); b. ETT-myoview 11/10, 3' stopped due to fatigue and chest tightness, EF normal, probably normal perfusion images with minimal soft tissue attenuation   Diastolic dysfunction    a. echo 2010: EF 60-65%, GR1DD, no AI, trivial MR, LA nl   GERD (gastroesophageal reflux disease)    Glaucoma    Hyperglycemia    Hyperlipidemia    Hypertension     Encounter Details:  CNP Questionnaire - 04/18/22 1415       Questionnaire   Do you give verbal consent to treat you today? Yes    Location Patient [place] or Organization    Patient Status Unknown    Insurance Unknown    Insurance Referral Medicaid;Medicare    Medication N/A    Medical Provider Yes    Screening Referrals N/A    Medical Referral N/A    Medical Appointment Made N/A    Food N/A    Transportation N/A    Housing/Utilities N/A    Interpersonal Safety N/A    Intervention Blood pressure;Blood glucose;Support;Counsel;Educate    ED Visit Averted N/A    Life-Saving Intervention Made N/A

## 2022-04-18 NOTE — Telephone Encounter (Signed)
Returned phone call to patient to reschedule her colonoscopy w/EGD with Dr. Marius Ditch.  This is a reschedule from 03/29/22. Lvm for her to call back.  Thanks, [place], [place]

## 2022-04-24 NOTE — Progress Notes (Unsigned)
Established Patient Office Visit  Subjective    Patient ID: Kellie Phelps, female    DOB: 07-05-1946  Age: 76 y.o. MRN: [id number]  CC:  No chief complaint on file.   HPI Kellie Phelps presents to establish care.  Hypertension/Diastolic HF: -Medications: Metoprolol 25 mg, Nitroglycerin PRN -Patient is compliant with above medications and reports no side effects. -Checking BP at home (average): 120-130/60-70 -Denies any SOB, CP, vision changes, LE edema or symptoms of hypotension -Follows with cardiology in [place], just had stress test on 10/13/21, EF 62% with small reversible defect of the anteroseptal wall in the apical and mid segments but otherwise normal.  -Last echo 05/12/2019 showing EF 60-65% with left ventricular diastolic impaired relaxation.   HLD/CAD: -Medications: Zocor 40 mg, aspirin 81 mg, fish oil  -Patient is compliant with above medications and reports no side effects.  -Last lipid panel: Lipid Panel     Component Value Date/Time   CHOL 143 01/18/2022 1350   CHOL 155 03/31/2015 1439   TRIG 128 01/18/2022 1350   HDL 47 (L) 01/18/2022 1350   HDL 45 03/31/2015 1439   CHOLHDL 3.0 01/18/2022 1350   VLDL 27.6 10/17/2012 0946   LDLCALC 75 01/18/2022 1350   LABVLDL 43 (H) 03/31/2015 1439    COPD/Asthma: -Following with [facility], last seen 10/27/20, note reviewed  -COPD status: controlled -Current medications: Wixela twice a day, Albuterol PRN -Satisfied with current treatment?: yes -Oxygen use: no -Dyspnea frequency: with exertion occasionally -Cough frequency: no -Rescue inhaler frequency:  not recently, out of Albuterol  -Limitation of activity: no -Productive cough: no -Last Spirometry/PFTs: 2019 -Pneumovax: Not up to Date, politely declines -Influenza: Not up to Date  GERD: -Currently on Prilosec 20 mg daily, but it is not working -Still having reflux symptoms, epigastric pain since eating at [employer] this  weekend. No nausea/vomiting. Appetite good, weight stable -GI appointment scheduled for 5/31  Glaucoma: -Currently on Dorzolamide 2% ophthalmic solution, following with Dr. Gloriann Loan  -Getting ready to have cataract surgery 5/15 in [place]  Seasonal Allergies: -Currently using Flonase, doing well  Anxiety: -Duration:exacerbated -Anxious mood: yes  -Excessive worrying: yes -Irritability: yes  -Sweating: no -Nausea: yes -Panic attacks: yes -Had been on Diazepam in the past, discussed how this medication would not be prescribed here but we can try something else     01/18/2022    1:06 PM  Depression screen PHQ 2/9  Decreased Interest 0  Down, Depressed, Hopeless 1  PHQ - 2 Score 1  Altered sleeping 0  Tired, decreased energy 0  Change in appetite 0  Feeling bad or failure about yourself  0  Trouble concentrating 0  Moving slowly or fidgety/restless 0  Suicidal thoughts 0  PHQ-9 Score 1  Difficult doing work/chores Not difficult at all    Health Maintenance: -Blood work due -Mammogram 9/22, [facility]-1 -Colon cancer screening: politely declines    Outpatient Encounter Medications as of 04/25/2022  Medication Sig   albuterol (PROAIR HFA) 108 (90 Base) MCG/ACT inhaler Inhale 2 puffs into the lungs every 6 (six) hours as needed. Wheezing or shortness of breath.   amLODipine (NORVASC) 5 MG tablet Take by mouth.   aspirin 81 MG tablet Take 81 mg by mouth daily.   Calcium Carbonate-Vitamin D 600-200 MG-UNIT TABS Take by mouth.   dorzolamide (TRUSOPT) 2 % ophthalmic solution    ferrous sulfate 325 (65 FE) MG EC tablet Take by mouth.   fluticasone (FLONASE) 50 MCG/ACT nasal spray Place  1 spray into both nostrils daily.   Fluticasone-Salmeterol (WIXELA INHUB) 250-50 MCG/DOSE AEPB Inhale 1 puff into the lungs in the morning and at bedtime.   gatifloxacin (ZYMAXID) 0.5 % SOLN SMARTSIG:In Eye(s)   hydrOXYzine (ATARAX) 10 MG tablet Take 1 tablet (10 mg total) by mouth daily as needed for  anxiety.   ketorolac (ACULAR) 0.5 % ophthalmic solution SMARTSIG:In Eye(s)   metoprolol succinate (TOPROL-XL) 25 MG 24 hr tablet Take 1 tablet (25 mg total) by mouth daily.   Multiple Vitamins-Minerals (ONE-A-DAY WOMENS 50 PLUS PO) Take by mouth daily.   nitroGLYCERIN (NITROSTAT) 0.4 MG SL tablet Place under the tongue.   Omega-3 Fatty Acids (FISH OIL) 1000 MG CAPS 1,000 mg. Take two tablets by mouth daily.   pantoprazole (PROTONIX) 40 MG tablet Take 1 tablet (40 mg total) by mouth daily.   prednisoLONE acetate (PRED FORTE) 1 % ophthalmic suspension SMARTSIG:In Eye(s)   simvastatin (ZOCOR) 40 MG tablet Take 1 tablet (40 mg total) by mouth daily.   VYZULTA 0.024 % SOLN  (Patient not taking: Reported on 03/06/2022)   No facility-administered encounter medications on file as of 04/25/2022.    Past Medical History:  Diagnosis Date   Allergy    Anxiety    Asthma    Atypical chest pain    Cataract    COPD (chronic obstructive pulmonary disease) (HCC)    Coronary artery disease, non-occlusive    a. LHC 6/08 EF 55-60%, 20-30% proximal LAD, LIs CFX, LIs RCA (Harwani); b. ETT-myoview 11/10, 3' stopped due to fatigue and chest tightness, EF normal, probably normal perfusion images with minimal soft tissue attenuation   Diastolic dysfunction    a. echo 2010: EF 60-65%, GR1DD, no AI, trivial MR, LA nl   GERD (gastroesophageal reflux disease)    Glaucoma    Hyperglycemia    Hyperlipidemia    Hypertension     Past Surgical History:  Procedure Laterality Date   TONSILLECTOMY      Family History  Problem Relation Age of Onset   Uterine cancer Mother    Heart attack Father    Pulmonary embolism Brother    Hepatitis Brother    Coronary artery disease Brother    Coronary artery disease Other        Aunt    Social History   Socioeconomic History   Marital status: Single    Spouse name: Not on file   Number of children: Not on file   Years of education: Not on file   Highest education  level: Not on file  Occupational History   Occupation: Retired    Fish farm manager: RETIRED  Tobacco Use   Smoking status: Former    Packs/day: 0.50    Years: 5.00    Total pack years: 2.50    Types: Cigarettes    Quit date: 09/17/1978    Years since quitting: 43.6   Smokeless tobacco: Never   Tobacco comments:    unsure how long she smoked for--10/27/2020  Vaping Use   Vaping Use: Never used  Substance and Sexual Activity   Alcohol use: No   Drug use: No   Sexual activity: Not Currently    Partners: Male    Birth control/protection: Post-menopausal  Other Topics Concern   Not on file  Social History Narrative   Divorced   Lives alone   No children   Does not get regular exercise   Social Determinants of Health   Financial Resource Strain: Not on file  Food  Insecurity: Not on file  Transportation Needs: Not on file  Physical Activity: Not on file  Stress: Not on file  Social Connections: Not on file  Intimate Partner Violence: Not on file    Review of Systems  Constitutional:  Negative for chills and fever.  Respiratory:  Positive for shortness of breath. Negative for cough and wheezing.   Cardiovascular:  Negative for chest pain.  Gastrointestinal:  Positive for abdominal pain and heartburn. Negative for constipation, diarrhea, nausea and vomiting.  Genitourinary:  Negative for dysuria and hematuria.  Neurological:  Negative for dizziness and headaches.  Psychiatric/Behavioral:  The patient is nervous/anxious.         Objective    There were no vitals taken for this visit.  Physical Exam Constitutional:      Appearance: Normal appearance.  HENT:     Head: Normocephalic and atraumatic.     Mouth/Throat:     Mouth: Mucous membranes are moist.     Pharynx: Oropharynx is clear.  Eyes:     Conjunctiva/sclera: Conjunctivae normal.  Cardiovascular:     Rate and Rhythm: Normal rate and regular rhythm.  Pulmonary:     Effort: Pulmonary effort is normal.      Breath sounds: Normal breath sounds.  Abdominal:     General: There is no distension.     Palpations: Abdomen is soft.     Tenderness: There is abdominal tenderness. There is no guarding.     Comments: Epigastric pain to palpation   Musculoskeletal:     Right lower leg: No edema.     Left lower leg: No edema.  Skin:    General: Skin is warm and dry.  Neurological:     General: No focal deficit present.     Mental Status: She is alert. Mental status is at baseline.  Psychiatric:        Mood and Affect: Mood normal.        Behavior: Behavior normal.         Assessment & Plan:   Problem List Items Addressed This Visit       Cardiovascular and Mediastinum   Essential hypertension - Primary    Stable.  Blood pressure at goal today.  Continue metoprolol 25 mg daily, refills placed today. Due for routine labs.        Relevant Medications   metoprolol succinate (TOPROL-XL) 25 MG 24 hr tablet         Other Relevant Orders   CBC w/Diff/Platelet   COMPLETE METABOLIC PANEL WITH GFR   Coronary artery disease, non-occlusive    Stable.  Following with cardiology, Dr. Rockey Situ.  Is on statin and aspirin.  Recheck lipid panel today.       Relevant Medications      simvastatin (ZOCOR) 40 MG tablet      Other Relevant Orders   Lipid Profile     Respiratory   Mild intermittent asthma without complication    Had been following with [facility], last note reviewed from 10/27/2020.  Continue Wixela twice daily and albuterol as needed.  Rescue inhaler refilled today.  No respiratory symptoms present today.       Relevant Medications   albuterol (PROAIR HFA) 108 (90 Base) MCG/ACT inhaler     Digestive   Gastroesophageal reflux disease    Not well controlled.  Discussed avoiding trigger foods as well as avoiding laying down immediately after eating.  Discontinue Prilosec, start Protonix 40 mg daily.  Relevant Medications   pantoprazole (PROTONIX) 40 MG tablet      Other   Hyperlipidemia    Recheck lipid panel today.  Continue Zocor 40 mg daily, fish oil.       Relevant Medications      simvastatin (ZOCOR) 40 MG tablet      Other Relevant Orders   Lipid Profile   Diastolic dysfunction    Follow with cardiology, Dr. Rockey Situ.  Last echo reviewed from 2020 showing an EF of 60 to 65%.  Recently had a stress test in January of this year showing an EF of 62%.       Hyperglycemia    Recheck glucose and A1c today.       Relevant Orders   HgB A1c   Seasonal allergies    Stable on Flonase.  Refilled today.       Relevant Medications   fluticasone (FLONASE) 50 MCG/ACT nasal spray   Anxiety    Exacerbated.  We did discuss that I will not prescribe benzodiazepines at this office.  We will try hydralazine as needed for anxiety.             Other Visit Diagnoses     Encounter for hepatitis C screening test for low risk patient       Relevant Orders   Hepatitis C Antibody       No follow-ups on file.   Teodora Medici, DO

## 2022-04-25 ENCOUNTER — Ambulatory Visit (INDEPENDENT_AMBULATORY_CARE_PROVIDER_SITE_OTHER): Payer: Medicare Other | Admitting: Internal Medicine

## 2022-04-25 ENCOUNTER — Encounter: Payer: Self-pay | Admitting: Internal Medicine

## 2022-04-25 VITALS — BP 124/72 | HR 72 | Temp 98.0°F | Resp 16 | Ht 65.0 in | Wt 209.0 lb

## 2022-04-25 DIAGNOSIS — R748 Abnormal levels of other serum enzymes: Secondary | ICD-10-CM

## 2022-04-25 DIAGNOSIS — I10 Essential (primary) hypertension: Secondary | ICD-10-CM | POA: Diagnosis not present

## 2022-04-25 DIAGNOSIS — K219 Gastro-esophageal reflux disease without esophagitis: Secondary | ICD-10-CM

## 2022-04-25 DIAGNOSIS — J432 Centrilobular emphysema: Secondary | ICD-10-CM | POA: Diagnosis not present

## 2022-04-25 DIAGNOSIS — R7303 Prediabetes: Secondary | ICD-10-CM

## 2022-04-25 DIAGNOSIS — E782 Mixed hyperlipidemia: Secondary | ICD-10-CM | POA: Diagnosis not present

## 2022-04-25 DIAGNOSIS — F419 Anxiety disorder, unspecified: Secondary | ICD-10-CM | POA: Diagnosis not present

## 2022-04-25 MED ORDER — BUSPIRONE HCL 5 MG PO TABS: 5.0000 mg | ORAL_TABLET | Freq: Two times a day (BID) | ORAL | 2 refills | Status: DC

## 2022-04-25 MED ORDER — PANTOPRAZOLE SODIUM 40 MG PO TBEC
40.0000 mg | DELAYED_RELEASE_TABLET | Freq: Every day | ORAL | 3 refills | Status: DC
Start: 2022-04-25 — End: 2022-07-26

## 2022-04-25 MED ORDER — FLUOXETINE HCL 10 MG PO CAPS: 10.0000 mg | ORAL_CAPSULE | Freq: Every day | ORAL | 1 refills | Status: DC

## 2022-04-25 MED ORDER — FLUTICASONE-SALMETEROL 250-50 MCG/ACT IN AEPB: 1.0000 | INHALATION_SPRAY | Freq: Two times a day (BID) | RESPIRATORY_TRACT | 2 refills | Status: DC

## 2022-04-25 NOTE — Patient Instructions (Addendum)
It was great seeing you today!  Plan discussed at today's visit: -Blood work ordered today, results will be uploaded to [facility].  -Medications refilled  -Start Prozac 20 mg daily, can take Hydroxyzine and Buspar as needed for anxiety  -Referral to Psychiatry placed today -Please call to schedule mammogram  Follow up in: 6 weeks   Take care and let us know if you have any questions or concerns prior to your next visit.  Dr. Rosana Berger  Managing Anxiety, Adult After being diagnosed with anxiety, you may be relieved to know why you have felt or behaved a certain way. You may also feel overwhelmed about the treatment ahead and what it will mean for your life. With care and support, you can manage this condition. How to manage lifestyle changes Managing stress and anxiety  Stress is your body's reaction to life changes and events, both good and bad. Most stress will last just a few hours, but stress can be ongoing and can lead to more than just stress. Although stress can play a major role in anxiety, it is not the same as anxiety. Stress is usually caused by something external, such as a deadline, test, or competition. Stress normally passes after the triggering event has ended.  Anxiety is caused by something internal, such as imagining a terrible outcome or worrying that something will go wrong that will devastate you. Anxiety often does not go away even after the triggering event is over, and it can become long-term (chronic) worry. It is important to understand the differences between stress and anxiety and to manage your stress effectively so that it does not lead to an anxious response. Talk with your health care provider or a counselor to learn more about reducing anxiety and stress. He or she may suggest tension reduction techniques, such as: Music therapy. Spend time creating or listening to music that you enjoy and that inspires you. Mindfulness-based meditation. Practice being aware of  your normal breaths while not trying to control your breathing. It can be done while sitting or walking. Centering prayer. This involves focusing on a word, phrase, or sacred image that means something to you and brings you peace. Deep breathing. To do this, expand your stomach and inhale slowly through your nose. Hold your breath for 3-5 seconds. Then exhale slowly, letting your stomach muscles relax. Self-talk. Learn to notice and identify thought patterns that lead to anxiety reactions and change those patterns to thoughts that feel peaceful. Muscle relaxation. Taking time to tense muscles and then relax them. Choose a tension reduction technique that fits your lifestyle and personality. These techniques take time and practice. Set aside 5-15 minutes a day to do them. Therapists can offer counseling and training in these techniques. The training to help with anxiety may be covered by some insurance plans. Other things you can do to manage stress and anxiety include: Keeping a stress diary. This can help you learn what triggers your reaction and then learn ways to manage your response. Thinking about how you react to certain situations. You may not be able to control everything, but you can control your response. Making time for activities that help you relax and not feeling guilty about spending your time in this way. Doing visual imagery. This involves imagining or creating mental pictures to help you relax. Practicing yoga. Through yoga poses, you can lower tension and promote relaxation.  Medicines Medicines can help ease symptoms. Medicines for anxiety include: Antidepressant medicines. These are usually prescribed for  long-term daily control. Anti-anxiety medicines. These may be added in severe cases, especially when panic attacks occur. Medicines will be prescribed by a health care provider. When used together, medicines, psychotherapy, and tension reduction techniques may be the most  effective treatment. Relationships Relationships can play a big part in helping you recover. Try to spend more time connecting with trusted friends and family members. Consider going to couples counseling if you have a partner, taking family education classes, or going to family therapy. Therapy can help you and others better understand your condition. How to recognize changes in your anxiety Everyone responds differently to treatment for anxiety. Recovery from anxiety happens when symptoms decrease and stop interfering with your daily activities at home or work. This may mean that you will start to: Have better concentration and focus. Worry will interfere less in your daily thinking. Sleep better. Be less irritable. Have more energy. Have improved memory. It is also important to recognize when your condition is getting worse. Contact your health care provider if your symptoms interfere with home or work and you feel like your condition is not improving. Follow these instructions at home: Activity Exercise. Adults should do the following: Exercise for at least 150 minutes each week. The exercise should increase your heart rate and make you sweat (moderate-intensity exercise). Strengthening exercises at least twice a week. Get the right amount and quality of sleep. Most adults need 7-9 hours of sleep each night. Lifestyle  Eat a healthy diet that includes plenty of vegetables, fruits, whole grains, low-fat dairy products, and lean protein. Do not eat a lot of foods that are high in fats, added sugars, or salt (sodium). Make choices that simplify your life. Do not use any products that contain nicotine or tobacco. These products include cigarettes, chewing tobacco, and vaping devices, such as e-cigarettes. If you need help quitting, ask your health care provider. Avoid caffeine, alcohol, and certain over-the-counter cold medicines. These may make you feel worse. Ask your pharmacist which  medicines to avoid. General instructions Take over-the-counter and prescription medicines only as told by your health care provider. Keep all follow-up visits. This is important. Where to find support You can get help and support from these sources: Self-help groups. Online and [employer]. A trusted spiritual leader. Couples counseling. Family education classes. Family therapy. Where to find more information You may find that joining a support group helps you deal with your anxiety. The following sources can help you locate counselors or support groups near you: [place]: www.mentalhealthamerica.net Anxiety and Depression Association of [place] (ADAA): https://www.clark.net/ National Alliance on Mental Illness (NAMI): www.nami.org Contact a health care provider if: You have a hard time staying focused or finishing daily tasks. You spend many hours a day feeling worried about everyday life. You become exhausted by worry. You start to have headaches or frequently feel tense. You develop chronic nausea or diarrhea. Get help right away if: You have a racing heart and shortness of breath. You have thoughts of hurting yourself or others. If you ever feel like you may hurt yourself or others, or have thoughts about taking your own life, get help right away. Go to your nearest emergency department or: Call your local emergency services (911 in the [place] Call a suicide crisis helpline, such as the [facility] at 412-751-9011 or 988 in the [place]. This is open 24 hours a day in the [place] Text the Crisis Text Line at 343-183-9081 (in the [place].). Summary Taking steps  to learn and use tension reduction techniques can help calm you and help prevent triggering an anxiety reaction. When used together, medicines, psychotherapy, and tension reduction techniques may be the most effective treatment. Family, friends, and partners can play a big part in supporting  you. This information is not intended to replace advice given to you by your health care provider. Make sure you discuss any questions you have with your health care provider. Document Revised: 03/29/2021 Document Reviewed: 12/25/2020 Elsevier Patient Education  [place].

## 2022-04-26 LAB — COMPLETE METABOLIC PANEL WITH GFR
AG Ratio: 1.6 (calc) (ref 1.0–2.5)
ALT: 42 U/L — ABNORMAL HIGH (ref 6–29)
AST: 34 U/L (ref 10–35)
Albumin: 4.2 g/dL (ref 3.6–5.1)
Alkaline phosphatase (APISO): 89 U/L (ref 37–153)
BUN: 10 mg/dL (ref 7–25)
CO2: 28 mmol/L (ref 20–32)
Calcium: 9.6 mg/dL (ref 8.6–10.4)
Chloride: 105 mmol/L (ref 98–110)
Creat: 0.96 mg/dL (ref 0.60–1.00)
Globulin: 2.6 g/dL (calc) (ref 1.9–3.7)
Glucose, Bld: 93 mg/dL (ref 65–99)
Potassium: 4.3 mmol/L (ref 3.5–5.3)
Sodium: 140 mmol/L (ref 135–146)
Total Bilirubin: 0.5 mg/dL (ref 0.2–1.2)
Total Protein: 6.8 g/dL (ref 6.1–8.1)
eGFR: 61 mL/min/{1.73_m2} (ref >=60)

## 2022-04-27 NOTE — Addendum Note (Signed)
Addended by: Teodora Medici on: 04/27/2022 08:44 AM   Modules accepted: Orders

## 2022-05-07 ENCOUNTER — Telehealth: Payer: Self-pay

## 2022-05-07 NOTE — Telephone Encounter (Signed)
Left detailed vm to come back for additional bloodwork unable to add on

## 2022-05-10 LAB — HEPATITIS PANEL, ACUTE
Hep A IgM: NONREACTIVE
Hep B C IgM: NONREACTIVE
Hepatitis B Surface Ag: NONREACTIVE
Hepatitis C Ab: NONREACTIVE

## 2022-05-14 ENCOUNTER — Telehealth: Payer: Self-pay | Admitting: Gastroenterology

## 2022-05-14 ENCOUNTER — Telehealth: Payer: Self-pay

## 2022-05-14 NOTE — Telephone Encounter (Signed)
Patient called to cancel colonoscopy and is requesting a call back to reschedule.

## 2022-05-14 NOTE — Telephone Encounter (Signed)
EGD has been rescheduled from 08/30 to 09/08 due to sinus infection. Leslie in Endo notified.  Thanks,  [place], [place]

## 2022-05-17 ENCOUNTER — Ambulatory Visit: Payer: Self-pay

## 2022-05-17 ENCOUNTER — Encounter: Payer: Self-pay | Admitting: Emergency Medicine

## 2022-05-17 ENCOUNTER — Other Ambulatory Visit: Payer: Self-pay

## 2022-05-17 ENCOUNTER — Emergency Department: Payer: Medicare Other

## 2022-05-17 DIAGNOSIS — R0789 Other chest pain: Secondary | ICD-10-CM | POA: Diagnosis not present

## 2022-05-17 DIAGNOSIS — R1013 Epigastric pain: Secondary | ICD-10-CM | POA: Diagnosis not present

## 2022-05-17 DIAGNOSIS — K219 Gastro-esophageal reflux disease without esophagitis: Secondary | ICD-10-CM | POA: Insufficient documentation

## 2022-05-17 DIAGNOSIS — I2510 Atherosclerotic heart disease of native coronary artery without angina pectoris: Secondary | ICD-10-CM | POA: Insufficient documentation

## 2022-05-17 DIAGNOSIS — R079 Chest pain, unspecified: Secondary | ICD-10-CM | POA: Diagnosis present

## 2022-05-17 DIAGNOSIS — J449 Chronic obstructive pulmonary disease, unspecified: Secondary | ICD-10-CM | POA: Insufficient documentation

## 2022-05-17 DIAGNOSIS — R110 Nausea: Secondary | ICD-10-CM | POA: Insufficient documentation

## 2022-05-17 LAB — CBC WITH DIFFERENTIAL/PLATELET
Abs Immature Granulocytes: 0.01 10*3/uL (ref 0.00–0.07)
Basophils Absolute: 0.1 10*3/uL (ref 0.0–0.1)
Basophils Relative: 1 %
Eosinophils Absolute: 0.1 10*3/uL (ref 0.0–0.5)
Eosinophils Relative: 2 %
HCT: 46.3 % — ABNORMAL HIGH (ref 36.0–46.0)
Hemoglobin: 15.5 g/dL — ABNORMAL HIGH (ref 12.0–15.0)
Immature Granulocytes: 0 %
Lymphocytes Relative: 41 %
Lymphs Abs: 2.8 10*3/uL (ref 0.7–4.0)
MCH: 29.1 pg (ref 26.0–34.0)
MCHC: 33.5 g/dL (ref 30.0–36.0)
MCV: 87 fL (ref 80.0–100.0)
Monocytes Absolute: 0.5 10*3/uL (ref 0.1–1.0)
Monocytes Relative: 7 %
Neutro Abs: 3.4 10*3/uL (ref 1.7–7.7)
Neutrophils Relative %: 49 %
Platelets: 231 10*3/uL (ref 150–400)
RBC: 5.32 MIL/uL — ABNORMAL HIGH (ref 3.87–5.11)
RDW: 12.7 % (ref 11.5–15.5)
WBC: 6.8 10*3/uL (ref 4.0–10.5)
nRBC: 0 % (ref 0.0–0.2)

## 2022-05-17 NOTE — Telephone Encounter (Signed)
Left pt vm, to notified

## 2022-05-17 NOTE — Telephone Encounter (Signed)
Pt has called back to say she will stop taking Prozac but will need something in its place. She wants to make sure something is sent in today even though her pharm may be closed. She would like to pick it up in the morning.

## 2022-05-17 NOTE — ED Triage Notes (Addendum)
EMS brings pt in from home for "burning" in chest and stomach; hx reflux, increased x wk; pt to triage via w/c with no distress noted; denies pain currently

## 2022-05-17 NOTE — Telephone Encounter (Signed)
     Chief Complaint: States started Prozac last week and 1 hour after taking it she becomes nauseated and "nervous." "I don't think I should take anymore." Wants PCP advice. Symptoms: Above Frequency: Last week Pertinent Negatives: Patient denies  Disposition: '[]'$ ED /'[]'$ Urgent Care (no appt availability in office) / '[]'$ Appointment(In office/virtual)/ '[]'$  [facility]/ '[]'$ Home Care/ '[]'$ Refused Recommended Disposition /'[]'$ [facility]/ '[x]'$  Follow-up with PCP Additional Notes: Please advise pt.   Answer Assessment - Initial Assessment Questions 1. NAME of MEDICINE: "What medicine(s) are you calling about?"     Prozac 2. QUESTION: "What is your question?" (e.g., double dose of medicine, side effect)     Side effects 3. PRESCRIBER: "Who prescribed the medicine?" Reason: if prescribed by specialist, call should be referred to that group.     Dr. Rosana Berger 4. SYMPTOMS: "Do you have any symptoms?" If Yes, ask: "What symptoms are you having?"  "How bad are the symptoms (e.g., mild, moderate, severe)     Nausea, nervous 5. PREGNANCY:  "Is there any chance that you are pregnant?" "When was your last menstrual period?"     No  Protocols used: Medication Question Call-A-AH

## 2022-05-18 ENCOUNTER — Ambulatory Visit: Payer: Self-pay

## 2022-05-18 ENCOUNTER — Other Ambulatory Visit: Payer: Self-pay | Admitting: Internal Medicine

## 2022-05-18 ENCOUNTER — Emergency Department
Admission: EM | Admit: 2022-05-18 | Discharge: 2022-05-18 | Disposition: A | Discharge status: Home / Self Care | Payer: Medicare Other | Attending: Emergency Medicine | Admitting: Emergency Medicine

## 2022-05-18 ENCOUNTER — Telehealth: Payer: Self-pay | Admitting: Internal Medicine

## 2022-05-18 DIAGNOSIS — R0789 Other chest pain: Secondary | ICD-10-CM

## 2022-05-18 DIAGNOSIS — R1013 Epigastric pain: Secondary | ICD-10-CM

## 2022-05-18 DIAGNOSIS — F419 Anxiety disorder, unspecified: Secondary | ICD-10-CM

## 2022-05-18 LAB — URINALYSIS, ROUTINE W REFLEX MICROSCOPIC
Bilirubin Urine: NEGATIVE
Glucose, UA: NEGATIVE mg/dL
Hgb urine dipstick: NEGATIVE
Ketones, ur: NEGATIVE mg/dL
Nitrite: NEGATIVE
Protein, ur: NEGATIVE mg/dL
Specific Gravity, Urine: 1.015 (ref 1.005–1.030)
pH: 5 (ref 5.0–8.0)

## 2022-05-18 LAB — COMPREHENSIVE METABOLIC PANEL
ALT: 36 U/L (ref 0–44)
AST: 36 U/L (ref 15–41)
Albumin: 3.9 g/dL (ref 3.5–5.0)
Alkaline Phosphatase: 72 U/L (ref 38–126)
Anion gap: 7 (ref 5–15)
BUN: 9 mg/dL (ref 8–23)
CO2: 23 mmol/L (ref 22–32)
Calcium: 9.2 mg/dL (ref 8.9–10.3)
Chloride: 108 mmol/L (ref 98–111)
Creatinine, Ser: 0.94 mg/dL (ref 0.44–1.00)
GFR, Estimated: >60 mL/min (ref >60)
Glucose, Bld: 121 mg/dL — ABNORMAL HIGH (ref 70–99)
Potassium: 3.1 mmol/L — ABNORMAL LOW (ref 3.5–5.1)
Sodium: 138 mmol/L (ref 135–145)
Total Bilirubin: 0.8 mg/dL (ref 0.3–1.2)
Total Protein: 7.2 g/dL (ref 6.5–8.1)

## 2022-05-18 LAB — TROPONIN I (HIGH SENSITIVITY)
Troponin I (High Sensitivity): 6 ng/L (ref <18)
Troponin I (High Sensitivity): 5 ng/L (ref <18)

## 2022-05-18 LAB — LIPASE, BLOOD: Lipase: 32 U/L (ref 11–51)

## 2022-05-18 MED ORDER — ESCITALOPRAM OXALATE 5 MG PO TABS: 5.0000 mg | ORAL_TABLET | Freq: Every day | ORAL | 1 refills | Status: DC

## 2022-05-18 MED ORDER — DICYCLOMINE HCL 10 MG PO CAPS
20.0000 mg | ORAL_CAPSULE | Freq: Once | ORAL | Status: AC
  Administered 2022-05-18: 20 mg via ORAL
  Filled 2022-05-18: qty 2

## 2022-05-18 MED ORDER — POTASSIUM CHLORIDE 20 MEQ PO PACK
40.0000 meq | PACK | Freq: Once | ORAL | Status: AC
Start: 2022-05-18 — End: 2022-05-18
  Administered 2022-05-18: 40 meq via ORAL
  Filled 2022-05-18: qty 2

## 2022-05-18 MED ORDER — DICYCLOMINE HCL 10 MG PO CAPS: 10.0000 mg | ORAL_CAPSULE | Freq: Three times a day (TID) | ORAL | 0 refills | Status: DC

## 2022-05-18 MED ORDER — ALUM & MAG HYDROXIDE-SIMETH 200-200-20 MG/5ML PO SUSP
30.0000 mL | Freq: Once | ORAL | Status: AC
  Administered 2022-05-18: 30 mL via ORAL
  Filled 2022-05-18: qty 30

## 2022-05-18 MED ORDER — LACTATED RINGERS IV BOLUS
1000.0000 mL | Freq: Once | INTRAVENOUS | Status: AC
  Administered 2022-05-18: 1000 mL via INTRAVENOUS

## 2022-05-18 MED ORDER — LIDOCAINE VISCOUS HCL 2 % MT SOLN
15.0000 mL | Freq: Once | OROMUCOSAL | Status: AC
  Administered 2022-05-18: 15 mL via OROMUCOSAL
  Filled 2022-05-18: qty 15

## 2022-05-18 NOTE — Telephone Encounter (Signed)
Left vm no interaction

## 2022-05-18 NOTE — Telephone Encounter (Signed)
Left vm

## 2022-05-18 NOTE — Telephone Encounter (Signed)
Call to patient- advised ok to start Lexapro tomorrow. Patient states she will continue her Bentyl and pantoprazole as directed. Patient thankful for call back and will await any additional instruction from provider.

## 2022-05-18 NOTE — Telephone Encounter (Signed)
The patient called in stating she went to [facility] ER last night for burning in her chest and stomach. She says she also has nausea and upset stomach which could be side effects from gastritis.She said her electrolytes were unbalanced and her potassium was low. She was given a prescription for bentyl to pick up today and start taking 1, 4 times a day. She is wondering if she should still be taking the prozac or if the provider is going to prescribe something different for her anxiety since she doesn't know if it was brought on by her medicine. She is taking her prozac this morning because she said her anxiety is bad. Please assist patient further.   [place], [place]  Phone: [phone]  Fax: [phone]   Left message to call back.

## 2022-05-18 NOTE — Telephone Encounter (Signed)
Copied from [place] [id number]. Topic: General - Other >> May 18, 2022  2:22 PM Kellie Phelps wrote: Reason for CRM: The patient has additional questions related to their recent escitalopram (LEXAPRO) 5 MG tablet [[id number]]  prescription   The patient would like to know when they can start taking the medication   Please contact the patient further when possible

## 2022-05-18 NOTE — ED Provider Notes (Signed)
[facility] Provider Note    Event Date/Time   First MD Initiated Contact with Patient 05/18/22 0422     (approximate)   History   Chest Pain   HPI  Kellie Phelps is a 76 y.o. female who presents to the ED for evaluation of Chest Pain   I reviewed GI clinic visit from 6/20.  History of chronic GERD and looks like she has an upcoming upper endoscopy on 9/8.  Otherwise does have a history of CAD, COPD, anxiety.  Patient presents to the ED evaluation of epigastric and chest burning sensation that has been intermittent for the past 1 week.  She is concerned it might be related to an SSRI that she was recently prescribed.  She reports nausea without emesis.  Denies any exertional symptoms, shortness of breath, cough, fever, diarrhea.  No hematochezia or melena.  Physical Exam   Triage Vital Signs: ED Triage Vitals  Enc Vitals Group     BP 05/17/22 2321 112/80     Pulse Rate 05/17/22 2321 66     Resp 05/17/22 2321 16     Temp 05/17/22 2321 98.3 F (36.8 C)     Temp Source 05/17/22 2321 Oral     SpO2 05/17/22 2300 98 %     Weight 05/17/22 2317 180 lb (81.6 kg)     Height 05/17/22 2317 '5\' 5"'$  (1.651 m)     Head Circumference --      Peak Flow --      Pain Score 05/17/22 2316 0     Pain Loc --      Pain Edu? --      Excl. in [place]? --     Most recent vital signs: Vitals:   05/17/22 2321 05/18/22 0131  BP: 112/80 (!) 152/84  Pulse: 66 (!) 57  Resp: 16 16  Temp: 98.3 F (36.8 C) 97.8 F (36.6 C)  SpO2: 95% 91%    General: Awake, no distress.  CV:  Good peripheral perfusion.  Resp:  Normal effort.  Abd:  No distention.  Soft and benign throughout. MSK:  No deformity noted.  Neuro:  No focal deficits appreciated. Other:     ED Results / Procedures / Treatments   Labs (all labs ordered are listed, but only abnormal results are displayed) Labs Reviewed  CBC WITH DIFFERENTIAL/PLATELET - Abnormal; Notable for the following  components:      Result Value   RBC 5.32 (*)    Hemoglobin 15.5 (*)    HCT 46.3 (*)    All other components within normal limits  COMPREHENSIVE METABOLIC PANEL - Abnormal; Notable for the following components:   Potassium 3.1 (*)    Glucose, Bld 121 (*)    All other components within normal limits  URINALYSIS, ROUTINE W REFLEX MICROSCOPIC - Abnormal; Notable for the following components:   Color, Urine YELLOW (*)    APPearance CLOUDY (*)    Leukocytes,Ua SMALL (*)    Bacteria, UA MANY (*)    All other components within normal limits  LIPASE, BLOOD  TROPONIN I (HIGH SENSITIVITY)  TROPONIN I (HIGH SENSITIVITY)    EKG Sinus rhythm with a rate of 69 bpm.  Normal axis and intervals.  No clear signs of acute ischemia.  RADIOLOGY CXR interpreted by me without evidence of acute cardiopulmonary pathology.  Official radiology report(s): DG Chest 2 View  Result Date: 05/17/2022 CLINICAL DATA:  Chest pain. EXAM: CHEST - 2 VIEW COMPARISON:  Chest x-ray  03/12/2022 FINDINGS: The heart size and mediastinal contours are within normal limits. Both lungs are clear. The visualized skeletal structures are unremarkable. IMPRESSION: No active cardiopulmonary disease. Electronically Signed   By: Ronney Asters M.D.   On: 05/17/2022 23:57    PROCEDURES and INTERVENTIONS:  .1-3 Lead EKG Interpretation  Performed by: Vladimir Crofts, MD Authorized by: Vladimir Crofts, MD     Interpretation: normal     ECG rate:  62   ECG rate assessment: normal     Rhythm: sinus rhythm     Ectopy: none     Conduction: normal     Medications  lactated ringers bolus 1,000 mL (1,000 mLs Intravenous New Bag/Given 05/18/22 0524)  alum & mag hydroxide-simeth (MAALOX/MYLANTA) 200-200-20 MG/5ML suspension 30 mL (30 mLs Oral Given 05/18/22 0524)  lidocaine (XYLOCAINE) 2 % viscous mouth solution 15 mL (15 mLs Mouth/Throat Given 05/18/22 0524)  potassium chloride (KLOR-CON) packet 40 mEq (40 mEq Oral Given 05/18/22 0524)  dicyclomine  (BENTYL) capsule 20 mg (20 mg Oral Given 05/18/22 0526)     IMPRESSION / MDM / ASSESSMENT AND PLAN / ED COURSE  I reviewed the triage vital signs and the nursing notes.  Differential diagnosis includes, but is not limited to, ACS, PTX, PNA, muscle strain/spasm, PE, dissection, anxiety, GERD, gastritis, upper GI bleed  {Patient presents with symptoms of an acute illness or injury that is potentially life-threatening.  76 year old woman presents from home with epigastric burning that has been worsening for the past week, likely GI in etiology and ultimately suitable for outpatient management with her upcoming endoscopy 8 days from now.  She looks systemically well.  Has some mild epigastric tenderness but no peritoneal features, benign lower abdomen.  She has benign work-up with nonischemic EKG and 2 negative troponins.  Normal lipase.  Hypokalemia is repleted orally.  Urine without infectious features and no signs of sepsis or significant hematologic derangements.  Symptoms resolved after GI cocktail and Bentyl.  Remains on the monitor without dysrhythmia.  She is tolerating p.o. intake and being observed for over 7 hours without acute incident or recurrence of her symptoms.  We will discharge with a prescription for Bentyl and recommendations to follow-up with her GI doctor.  We discussed return precautions.  Clinical Course as of 05/18/22 0625  Fri May 18, 2022  0613 Reassessed.  Patient reports feeling better.  No needs [DS]    Clinical Course User Index [DS] Vladimir Crofts, MD     FINAL CLINICAL IMPRESSION(S) / ED DIAGNOSES   Final diagnoses:  Abdominal pain, epigastric  Other chest pain     Rx / DC Orders   ED Discharge Orders          Ordered    dicyclomine (BENTYL) 10 MG capsule  3 times daily before meals & bedtime        05/18/22 3329             Note:  This document was prepared using Dragon voice recognition software and may include unintentional dictation errors.    Vladimir Crofts, MD 05/18/22 [phone]

## 2022-05-18 NOTE — Telephone Encounter (Signed)
  Chief Complaint: Questions regarding possible drug interactions Symptoms: Seen at [facility] for chest and stomach burning Frequency: 1 week Pertinent Negatives: Patient denies  Disposition: '[]'$ ED /'[]'$ Urgent Care (no appt availability in office) / '[]'$ Appointment(In office/virtual)/ '[]'$  [place] [facility]/ '[]'$ Home Care/ '[]'$ Refused Recommended Disposition /'[]'$ [place] [facility]/ '[x]'$  Follow-up with PCP Additional Notes: PT was given bentyl and pantoprazole at ED.  PT is concerned about interactions between medications. Pt will discontinue Prozac and start lexapro.      Summary: Burning in stomach, prozac?   The patient called in stating she went to [facility] ER last night for burning in her chest and stomach. She says she also has nausea and upset stomach which could be side effects from gastritis.She said her electrolytes were unbalanced and her potassium was low. She was given a prescription for bentyl to pick up today and start taking 1, 4 times a day. She is wondering if she should still be taking the prozac or if the provider is going to prescribe something different for her anxiety since she doesn't know if it was brought on by her medicine. She is taking her prozac this morning because she said her anxiety is bad. Please assist patient further.   [place], [place]  Phone: [phone]  Fax: [phone]      Reason for Disposition  [1] Caller has medicine question about med NOT prescribed by PCP AND [2] triager unable to answer question (e.g., compatibility with other med, storage)  Answer Assessment - Initial Assessment Questions 1. REASON FOR CALL or QUESTION: "What is your reason for calling today?" or "How can I best help you?" or "What question do you have that I can help answer?"     PT wanted to make sure that a new medication was prescribed, she took a Prozac today for anxiety, but will discontinue as she feels it upsets her stomach.  Also pt  is concerned about drug interactions.  Answer Assessment - Initial Assessment Questions 1. NAME of MEDICINE: "What medicine(s) are you calling about?"     Prozac, Bentyl and pantoprazole, and lexapro. PT is discontinuing Prozac. 2. QUESTION: "What is your question?" (e.g., double dose of medicine, side effect)     Pt is concerned about interaction between these medications. 3. PRESCRIBER: "Who prescribed the medicine?" Reason: if prescribed by specialist, call should be referred to that group.     Dr. Rosana Berger and [facility] 4. SYMPTOMS: "Do you have any symptoms?" If Yes, ask: "What symptoms are you having?"  "How bad are the symptoms (e.g., mild, moderate, severe)     Pain in stomach. 5. PREGNANCY:  "Is there any chance that you are pregnant?" "When was your last menstrual period?"  Protocols used: Information Only Call - No Triage-A-AH, Medication Question Call-A-AH

## 2022-05-18 NOTE — Discharge Instructions (Signed)
Try using the Bentyl 3-4 times daily as needed for any further abdominal burning and cramping.  Continue all of your other medications.  Follow-up with your GI doctor with the upcoming endoscopy

## 2022-05-22 ENCOUNTER — Telehealth: Payer: Medicare Other | Admitting: Internal Medicine

## 2022-05-22 ENCOUNTER — Ambulatory Visit: Payer: Self-pay

## 2022-05-22 NOTE — Telephone Encounter (Signed)
  Chief Complaint: medication advice Symptoms: nervous and jittery feeling after taking Bentyl, pt states she is still having stomach issues as well Frequency: ongoing since she started taking  Pertinent Negatives: NA Disposition: '[]'$ ED /'[]'$ Urgent Care (no appt availability in office) / '[x]'$ Appointment(In office/virtual)/ '[]'$  [place] [facility]/ '[]'$ Home Care/ '[]'$ Refused Recommended Disposition /'[]'$ [place] [facility]/ '[]'$  Follow-up with PCP Additional Notes: pt unable to come in d/t transportation issues and has EGD scheduled for 05/25/22. Scheduled pt for telephone visit today at 1500 with PCP and advised pt to quit taking medication until she speaks with provider. Pt verbalized understanding.   Summary: Medication Reaction   Patient states that dicyclomine (BENTYL) 10 MG capsule is making her very nervous and jittery. Patients wants to know if she can quit taking it. Please advise.      Reason for Disposition  [1] Caller has URGENT medicine question about med that PCP or specialist prescribed AND [2] triager unable to answer question  Answer Assessment - Initial Assessment Questions 1. NAME of MEDICINE: "What medicine(s) are you calling about?"     Bentyl  2. QUESTION: "What is your question?" (e.g., double dose of medicine, side effect)     Wanting to stop medication 3. PRESCRIBER: "Who prescribed the medicine?" Reason: if prescribed by specialist, call should be referred to that group.     ED provider 4. SYMPTOMS: "Do you have any symptoms?" If Yes, ask: "What symptoms are you having?"  "How bad are the symptoms (e.g., mild, moderate, severe)     Nervous and jittery  Protocols used: Medication Question Call-A-AH

## 2022-05-23 ENCOUNTER — Telehealth: Payer: Self-pay

## 2022-05-23 NOTE — Telephone Encounter (Signed)
Patient contacted office to discuss what to expect with her upcoming scheduled EGD with Dr. Marius Ditch on Friday.  Message routed to [facility] to contact patient to discuss.  Thanks, [place], [place]

## 2022-05-23 NOTE — Telephone Encounter (Signed)
Patient is wanting to know if the person that brings her can leave and come back and get her and explained to her yes they could leave and come back they just need to close to medical mall so they can come back when they call. She asked for me to explained the procedure. Explained procedure to her.

## 2022-05-25 ENCOUNTER — Ambulatory Visit: Payer: Medicare Other | Admitting: Anesthesiology

## 2022-05-25 ENCOUNTER — Encounter
Admission: RE | Disposition: A | Discharge status: Home / Self Care | Payer: Self-pay | Source: Ambulatory Visit | Attending: Gastroenterology

## 2022-05-25 ENCOUNTER — Ambulatory Visit
Admission: RE | Admit: 2022-05-25 | Discharge: 2022-05-25 | Disposition: A | Discharge status: Home / Self Care | Payer: Medicare Other | Source: Ambulatory Visit | Attending: Gastroenterology | Admitting: Gastroenterology

## 2022-05-25 ENCOUNTER — Encounter: Payer: Self-pay | Admitting: Gastroenterology

## 2022-05-25 DIAGNOSIS — I10 Essential (primary) hypertension: Secondary | ICD-10-CM | POA: Diagnosis not present

## 2022-05-25 DIAGNOSIS — I2510 Atherosclerotic heart disease of native coronary artery without angina pectoris: Secondary | ICD-10-CM | POA: Insufficient documentation

## 2022-05-25 DIAGNOSIS — F418 Other specified anxiety disorders: Secondary | ICD-10-CM | POA: Insufficient documentation

## 2022-05-25 DIAGNOSIS — J449 Chronic obstructive pulmonary disease, unspecified: Secondary | ICD-10-CM | POA: Insufficient documentation

## 2022-05-25 DIAGNOSIS — K2289 Other specified disease of esophagus: Secondary | ICD-10-CM | POA: Diagnosis not present

## 2022-05-25 DIAGNOSIS — K3189 Other diseases of stomach and duodenum: Secondary | ICD-10-CM | POA: Diagnosis not present

## 2022-05-25 DIAGNOSIS — Z09 Encounter for follow-up examination after completed treatment for conditions other than malignant neoplasm: Secondary | ICD-10-CM | POA: Diagnosis present

## 2022-05-25 DIAGNOSIS — E785 Hyperlipidemia, unspecified: Secondary | ICD-10-CM | POA: Diagnosis not present

## 2022-05-25 DIAGNOSIS — K219 Gastro-esophageal reflux disease without esophagitis: Secondary | ICD-10-CM | POA: Insufficient documentation

## 2022-05-25 DIAGNOSIS — K319 Disease of stomach and duodenum, unspecified: Secondary | ICD-10-CM | POA: Diagnosis not present

## 2022-05-25 DIAGNOSIS — Z87891 Personal history of nicotine dependence: Secondary | ICD-10-CM | POA: Insufficient documentation

## 2022-05-25 DIAGNOSIS — B3781 Candidal esophagitis: Secondary | ICD-10-CM | POA: Diagnosis not present

## 2022-05-25 HISTORY — PX: ESOPHAGOGASTRODUODENOSCOPY (EGD) WITH PROPOFOL: SHX5813

## 2022-05-25 SURGERY — ESOPHAGOGASTRODUODENOSCOPY (EGD) WITH PROPOFOL
Anesthesia: General

## 2022-05-25 MED ORDER — PROPOFOL 500 MG/50ML IV EMUL
INTRAVENOUS | Status: DC | PRN
  Administered 2022-05-25: 140 ug/kg/min via INTRAVENOUS

## 2022-05-25 MED ORDER — PROPOFOL 10 MG/ML IV BOLUS
INTRAVENOUS | Status: DC | PRN
  Administered 2022-05-25: 80 mg via INTRAVENOUS

## 2022-05-25 MED ORDER — SODIUM CHLORIDE 0.9 % IV SOLN
INTRAVENOUS | Status: DC
  Administered 2022-05-25: 20 mL/h via INTRAVENOUS

## 2022-05-25 MED ORDER — LIDOCAINE HCL (CARDIAC) PF 100 MG/5ML IV SOSY
PREFILLED_SYRINGE | INTRAVENOUS | Status: DC | PRN
  Administered 2022-05-25: 100 mg via INTRAVENOUS

## 2022-05-25 NOTE — Op Note (Signed)
[facility] Gastroenterology Patient Name: Shane Melby Procedure Date: 05/25/2022 8:49 AM MRN: [id number] Account #: [id number] Date of Birth: 07-17-1946 Admit Type: Outpatient Age: 76 Room: [facility] ENDO ROOM 2 Gender: Female Note Status: Finalized Instrument Name: Upper Endoscope [id number] Procedure:             Upper GI endoscopy Indications:           Follow-up of gastro-esophageal reflux disease,                         Esophageal reflux symptoms that persist despite                         appropriate therapy Providers:             Lin Landsman MD, MD Medicines:             General Anesthesia Complications:         No immediate complications. Estimated blood loss: None. Procedure:             Pre-Anesthesia Assessment:                        - Prior to the procedure, a History and Physical was                         performed, and patient medications and allergies were                         reviewed. The patient is competent. The risks and                         benefits of the procedure and the sedation options and                         risks were discussed with the patient. All questions                         were answered and informed consent was obtained.                         Patient identification and proposed procedure were                         verified by the physician, the nurse, the                         anesthesiologist, the anesthetist and the technician                         in the pre-procedure area in the procedure room in the                         endoscopy suite. Mental Status Examination: alert and                         oriented. Airway Examination: normal oropharyngeal                         airway and neck mobility.  Respiratory Examination:                         clear to auscultation. CV Examination: normal.                         Prophylactic Antibiotics: The patient does not require                          prophylactic antibiotics. Prior Anticoagulants: The                         patient has taken no previous anticoagulant or                         antiplatelet agents. ASA Grade Assessment: III - A                         patient with severe systemic disease. After reviewing                         the risks and benefits, the patient was deemed in                         satisfactory condition to undergo the procedure. The                         anesthesia plan was to use general anesthesia.                         Immediately prior to administration of medications,                         the patient was re-assessed for adequacy to receive                         sedatives. The heart rate, respiratory rate, oxygen                         saturations, blood pressure, adequacy of pulmonary                         ventilation, and response to care were monitored                         throughout the procedure. The physical status of the                         patient was re-assessed after the procedure.                        After obtaining informed consent, the endoscope was                         passed under direct vision. Throughout the procedure,                         the patient's blood pressure, pulse, and oxygen  saturations were monitored continuously. The Endoscope                         was introduced through the mouth, and advanced to the                         second part of duodenum. The upper GI endoscopy was                         accomplished without difficulty. The patient tolerated                         the procedure well. Findings:      The duodenal bulb and second portion of the duodenum were normal.      Diffuse mildly erythematous mucosa without bleeding was found in the       gastric fundus and in the gastric body.      The incisura and gastric antrum were normal. Biopsies were taken with a       cold forceps for Helicobacter pylori  testing.      White nummular lesions were noted in the middle third of the esophagus.       Biopsies were taken with a cold forceps for histology.      The gastroesophageal junction was normal. Impression:            - Normal duodenal bulb and second portion of the                         duodenum.                        - Erythematous mucosa in the gastric fundus and                         gastric body.                        - Normal incisura and antrum. Biopsied.                        - White nummular lesions in esophageal mucosa.                         Biopsied.                        - Normal gastroesophageal junction. Recommendation:        - Await pathology results.                        - Discharge patient to home (with escort).                        - Resume previous diet today.                        - Continue present medications.                        - Follow an antireflux regimen. Procedure Code(s):     --- Professional ---  19758, Esophagogastroduodenoscopy, flexible,                         transoral; with biopsy, single or multiple Diagnosis Code(s):     --- Professional ---                        K31.89, Other diseases of stomach and duodenum                        K22.8, Other specified diseases of esophagus                        K21.9, Gastro-esophageal reflux disease without                         esophagitis CPT copyright 2019 American Medical Association. All rights reserved. The codes documented in this report are preliminary and upon coder review may  be revised to meet current compliance requirements. Dr. Ulyess Mort Lin Landsman MD, MD 05/25/2022 9:40:03 AM This report has been signed electronically. Number of Addenda: 0 Note Initiated On: 05/25/2022 8:49 AM Estimated Blood Loss:  Estimated blood loss: none.      [facility]

## 2022-05-25 NOTE — Transfer of Care (Signed)
Immediate Anesthesia Transfer of Care Note  [name]  Procedure(s) Performed: ESOPHAGOGASTRODUODENOSCOPY (EGD) WITH PROPOFOL  Patient Location: PACU and Endoscopy Unit  Anesthesia Type:General  Level of Consciousness: drowsy  Airway & Oxygen Therapy: Patient Spontanous Breathing  Post-op Assessment: Report given to RN  Post vital signs: stable  Last Vitals:  Vitals Value Taken Time  BP    Temp    Pulse    Resp    SpO2      Last Pain:  Vitals:   05/25/22 0826  TempSrc: Temporal  PainSc: 0-No pain         Complications: No notable events documented.

## 2022-05-25 NOTE — H&P (Signed)
Cephas Darby, MD [address]  [place]  [place], [place] [address]  Main: [phone]  Fax: [phone] Pager: [phone]  Primary Care Physician:  Teodora Medici, DO Primary Gastroenterologist:  Dr. Cephas Darby  Pre-Procedure History & Physical: HPI:  Kellie Phelps is a 76 y.o. female is here for an endoscopy.   Past Medical History:  Diagnosis Date   Allergy    Anxiety    Asthma    Atypical chest pain    Cataract    COPD (chronic obstructive pulmonary disease) (HCC)    Coronary artery disease, non-occlusive    a. LHC 6/08 EF 55-60%, 20-30% proximal LAD, LIs CFX, LIs RCA (Harwani); b. ETT-myoview 11/10, 3' stopped due to fatigue and chest tightness, EF normal, probably normal perfusion images with minimal soft tissue attenuation   Diastolic dysfunction    a. echo 2010: EF 60-65%, GR1DD, no AI, trivial MR, LA nl   GERD (gastroesophageal reflux disease)    Glaucoma    Hyperglycemia    Hyperlipidemia    Hypertension     Past Surgical History:  Procedure Laterality Date   TONSILLECTOMY      Prior to Admission medications   Medication Sig Start Date End Date Taking? Authorizing Provider  albuterol (PROAIR HFA) 108 (90 Base) MCG/ACT inhaler Inhale 2 puffs into the lungs every 6 (six) hours as needed. Wheezing or shortness of breath. 01/18/22  Yes Teodora Medici, DO  amLODipine (NORVASC) 5 MG tablet Take by mouth. 06/06/17  Yes [provider]  aspirin 81 MG tablet Take 81 mg by mouth daily.   Yes [provider]  busPIRone (BUSPAR) 5 MG tablet Take 1 tablet (5 mg total) by mouth 2 (two) times daily. 04/25/22  Yes Teodora Medici, DO  Calcium Carbonate-Vitamin D 600-200 MG-UNIT TABS Take by mouth.   Yes [provider]  dicyclomine (BENTYL) 10 MG capsule Take 1 capsule (10 mg total) by mouth 4 (four) times daily -  before meals and at bedtime. 05/18/22  Yes Vladimir Crofts, MD  dorzolamide (TRUSOPT) 2 % ophthalmic  solution  08/24/19  Yes [provider]  escitalopram (LEXAPRO) 5 MG tablet Take 1 tablet (5 mg total) by mouth daily. 05/18/22  Yes Teodora Medici, DO  ferrous sulfate 325 (65 FE) MG EC tablet Take by mouth.   Yes [provider]  fluticasone (FLONASE) 50 MCG/ACT nasal spray Place 1 spray into both nostrils daily. 01/18/22  Yes Teodora Medici, DO  fluticasone-salmeterol [facility] INHUB) 250-50 MCG/ACT AEPB Inhale 1 puff into the lungs in the morning and at bedtime. 04/25/22  Yes Teodora Medici, DO  hydrOXYzine (ATARAX) 10 MG tablet Take 1 tablet (10 mg total) by mouth daily as needed for anxiety. 01/18/22  Yes Teodora Medici, DO  metoprolol succinate (TOPROL-XL) 25 MG 24 hr tablet Take 1 tablet (25 mg total) by mouth daily. 01/18/22  Yes Teodora Medici, DO  Multiple Vitamins-Minerals (ONE-A-DAY WOMENS 50 PLUS PO) Take by mouth daily.   Yes [provider]  nitroGLYCERIN (NITROSTAT) 0.4 MG SL tablet Place under the tongue. 09/06/21  Yes [provider]  Omega-3 Fatty Acids (FISH OIL) 1000 MG CAPS 1,000 mg. Take two tablets by mouth daily.   Yes [provider]  pantoprazole (PROTONIX) 40 MG tablet Take 1 tablet (40 mg total) by mouth daily. 04/25/22  Yes Teodora Medici, DO  simvastatin (ZOCOR) 40 MG tablet Take 1 tablet (40 mg total) by mouth daily. 01/18/22  Yes Teodora Medici, DO  VYZULTA 0.024 % SOLN  08/31/19  Yes [provider]    Allergies as of 04/19/2022 - Review Complete 03/12/2022  Allergen Reaction Noted   Penicillins Hives     Family History  Problem Relation Age of Onset   Uterine cancer Mother    Heart attack Father    Pulmonary embolism Brother    Hepatitis Brother    Coronary artery disease Brother    Coronary artery disease Other        Aunt    Social History   Socioeconomic History   Marital status: Single    Spouse name: Not on file   Number of children: Not on file   Years of education: Not on  file   Highest education level: Not on file  Occupational History   Occupation: Retired    Fish farm manager: RETIRED  Tobacco Use   Smoking status: Former    Packs/day: 0.50    Years: 5.00    Total pack years: 2.50    Types: Cigarettes    Quit date: 09/17/1978    Years since quitting: 43.7   Smokeless tobacco: Never   Tobacco comments:    unsure how long she smoked for--10/27/2020  Vaping Use   Vaping Use: Never used  Substance and Sexual Activity   Alcohol use: No   Drug use: No   Sexual activity: Not Currently    Partners: Male    Birth control/protection: Post-menopausal  Other Topics Concern   Not on file  Social History Narrative   Divorced   Lives alone   No children   Does not get regular exercise   Social Determinants of Health   Financial Resource Strain: Not on file  Food Insecurity: Not on file  Transportation Needs: Not on file  Physical Activity: Not on file  Stress: Not on file  Social Connections: Not on file  Intimate Partner Violence: Not on file    Review of Systems: See HPI, otherwise negative ROS  Physical Exam: BP 116/78   Pulse 68   Temp (!) 96.9 F (36.1 C) (Temporal)   Resp 20   Ht '5\' 6"'$  (1.676 m)   Wt 81.6 kg   SpO2 97%   BMI 29.05 kg/m  General:   Alert,  pleasant and cooperative in NAD Head:  Normocephalic and atraumatic. Neck:  Supple; no masses or thyromegaly. Lungs:  Clear throughout to auscultation.    Heart:  Regular rate and rhythm. Abdomen:  Soft, nontender and nondistended. Normal bowel sounds, without guarding, and without rebound.   Neurologic:  Alert and  oriented x4;  grossly normal neurologically.  Impression/Plan: Kellie Phelps is here for an endoscopy to be performed for chronic GERD  Risks, benefits, limitations, and alternatives regarding  endoscopy have been reviewed with the patient.  Questions have been answered.  All parties agreeable.   Sherri Sear, MD  05/25/2022, 8:49 AM

## 2022-05-25 NOTE — Anesthesia Preprocedure Evaluation (Signed)
Anesthesia Evaluation  Patient identified by MRN, date of birth, ID band Patient awake    Reviewed: Allergy & Precautions, NPO status , Patient's Chart, lab work & pertinent test results  History of Anesthesia Complications Negative for: history of anesthetic complications  Airway Mallampati: III  TM Distance: >3 FB Neck ROM: full    Dental  (+) Missing   Pulmonary shortness of breath and with exertion, asthma , COPD, former smoker,    Pulmonary exam normal        Cardiovascular Exercise Tolerance: Good hypertension, (-) angina+ CAD  Normal cardiovascular exam     Neuro/Psych PSYCHIATRIC DISORDERS negative neurological ROS     GI/Hepatic Neg liver ROS, GERD  Controlled,  Endo/Other  negative endocrine ROS  Renal/GU negative Renal ROS  negative genitourinary   Musculoskeletal   Abdominal   Peds  Hematology negative hematology ROS (+)   Anesthesia Other Findings Past Medical History: No date: Allergy No date: Anxiety No date: Asthma No date: Atypical chest pain No date: Cataract No date: COPD (chronic obstructive pulmonary disease) (HCC) No date: Coronary artery disease, non-occlusive     Comment:  a. LHC 6/08 EF 55-60%, 20-30% proximal LAD, LIs CFX, LIs              RCA (Harwani); b. ETT-myoview 11/10, 3' stopped due to               fatigue and chest tightness, EF normal, probably normal               perfusion images with minimal soft tissue attenuation No date: Diastolic dysfunction     Comment:  a. echo 2010: EF 60-65%, GR1DD, no AI, trivial MR, LA nl No date: GERD (gastroesophageal reflux disease) No date: Glaucoma No date: Hyperglycemia No date: Hyperlipidemia No date: Hypertension  Past Surgical History: No date: TONSILLECTOMY  BMI    Body Mass Index: 29.05 kg/m      Reproductive/Obstetrics negative OB ROS                             Anesthesia  Physical Anesthesia Plan  ASA: 3  Anesthesia Plan: General   Post-op Pain Management:    Induction: Intravenous  PONV Risk Score and Plan: Propofol infusion and TIVA  Airway Management Planned: Natural Airway and Nasal Cannula  Additional Equipment:   Intra-op Plan:   Post-operative Plan:   Informed Consent: I have reviewed the patients History and Physical, chart, labs and discussed the procedure including the risks, benefits and alternatives for the proposed anesthesia with the patient or authorized representative who has indicated his/her understanding and acceptance.     Dental Advisory Given  Plan Discussed with: Anesthesiologist, CRNA and Surgeon  Anesthesia Plan Comments: (Patient consented for risks of anesthesia including but not limited to:  - adverse reactions to medications - risk of airway placement if required - damage to eyes, teeth, lips or other oral mucosa - nerve damage due to positioning  - sore throat or hoarseness - Damage to heart, brain, nerves, lungs, other parts of body or loss of life  Patient voiced understanding.)        Anesthesia Quick Evaluation

## 2022-05-25 NOTE — Anesthesia Postprocedure Evaluation (Signed)
Anesthesia Post Note  [name]  Procedure(s) Performed: ESOPHAGOGASTRODUODENOSCOPY (EGD) WITH PROPOFOL  Patient location during evaluation: Endoscopy Anesthesia Type: General Level of consciousness: awake and alert Pain management: pain level controlled Vital Signs Assessment: post-procedure vital signs reviewed and stable Respiratory status: spontaneous breathing, nonlabored ventilation, respiratory function stable and patient connected to nasal cannula oxygen Cardiovascular status: blood pressure returned to baseline and stable Postop Assessment: no apparent nausea or vomiting Anesthetic complications: no   No notable events documented.   Last Vitals:  Vitals:   05/25/22 1000 05/25/22 1010  BP: 139/81 (!) 144/94  Pulse:  (!) 53  Resp:    Temp:    SpO2: 96% 98%    Last Pain:  Vitals:   05/25/22 1010  TempSrc:   PainSc: 0-No pain                 Precious Haws [name]

## 2022-05-28 LAB — SURGICAL PATHOLOGY

## 2022-05-30 ENCOUNTER — Telehealth: Payer: Self-pay

## 2022-05-30 MED ORDER — FLUCONAZOLE 200 MG PO TABS: ORAL_TABLET | ORAL | 0 refills | Status: AC

## 2022-05-30 NOTE — Telephone Encounter (Signed)
-----   Message from Lin Landsman, MD sent at 05/30/2022 10:51 AM EDT ----- Pathology results from upper endoscopy confirmed esophageal candidiasis.  Recommend oral fluconazole 400 mg on day 1 followed by 200 mg for 13 days  RV

## 2022-05-30 NOTE — Telephone Encounter (Signed)
Patient verbalized understanding of results. Sent medication to the pharmacy and she will pick up the medication

## 2022-06-06 ENCOUNTER — Ambulatory Visit (INDEPENDENT_AMBULATORY_CARE_PROVIDER_SITE_OTHER): Payer: Medicare Other | Admitting: Internal Medicine

## 2022-06-06 ENCOUNTER — Encounter: Payer: Self-pay | Admitting: Internal Medicine

## 2022-06-06 VITALS — BP 120/68 | HR 68 | Temp 97.6°F | Resp 16 | Ht 65.0 in | Wt 202.0 lb

## 2022-06-06 DIAGNOSIS — F419 Anxiety disorder, unspecified: Secondary | ICD-10-CM | POA: Diagnosis not present

## 2022-06-06 MED ORDER — ESCITALOPRAM OXALATE 10 MG PO TABS: 10.0000 mg | ORAL_TABLET | Freq: Every day | ORAL | 0 refills | Status: DC

## 2022-06-06 NOTE — Patient Instructions (Signed)
It was great seeing you today!  Plan discussed at today's visit: -Lexapro increased to 10 mg -Be sure to rinse your mouth out good after using your inhaler   Follow up in: November, already scheduled   Take care and let us know if you have any questions or concerns prior to your next visit.  Dr. Rosana Berger

## 2022-06-06 NOTE — Progress Notes (Signed)
Established Patient Office Visit  Subjective    Patient ID: Kellie Phelps, female    DOB: Jun 08, 1946  Age: 76 y.o. MRN: [id number]  CC:  Chief Complaint  Patient presents with   Follow-up   Depression    HPI Kellie Phelps presents for follow up on anxiety.  Anxiety: -Duration:better -Anxious mood: yes but improved  -Excessive worrying: yes -Irritability: yes  -Sweating: no -Nausea: yes -Panic attacks: no -Had been on Diazepam in the past, discussed how this medication would not be prescribed here. She was given Hydroxyzine 10 mg at her LOV for as needed anxiety, she took it once and felt very sleepy. At our [place] she was given Prozac 10 mg and Buspar to take as needed. Referral to Psychiatry was placed. She has an appointment on 06/19/22. She called back later saying that she had side effects from the Prozac (doesn't remember what the side effects were now) and was started on Lexapro 5 mg instead which she is doing well with.      06/06/2022    9:06 AM 04/25/2022    9:56 AM 01/18/2022    1:06 PM  Depression screen PHQ 2/9  Decreased Interest 0 0 0  Down, Depressed, Hopeless 0 2 1  PHQ - 2 Score 0 2 1  Altered sleeping 0 0 0  Tired, decreased energy 1 0 0  Change in appetite 0 0 0  Feeling bad or failure about yourself  0 0 0  Trouble concentrating 0 0 0  Moving slowly or fidgety/restless 0 0 0  Suicidal thoughts 0 0 0  PHQ-9 Score '1 2 1  '$ Difficult doing work/chores Not difficult at all Not difficult at all Not difficult at all   Health Maintenance: -Blood work UTD -Mammogram 9/22, Birads-1, will call to schedule for this year  Outpatient Encounter Medications as of 06/06/2022  Medication Sig   albuterol (PROAIR HFA) 108 (90 Base) MCG/ACT inhaler Inhale 2 puffs into the lungs every 6 (six) hours as needed. Wheezing or shortness of breath.   amLODipine (NORVASC) 5 MG tablet Take by mouth.   aspirin 81 MG tablet Take 81 mg by mouth daily.   busPIRone  (BUSPAR) 5 MG tablet Take 1 tablet (5 mg total) by mouth 2 (two) times daily.   Calcium Carbonate-Vitamin D 600-200 MG-UNIT TABS Take by mouth.   dicyclomine (BENTYL) 10 MG capsule Take 1 capsule (10 mg total) by mouth 4 (four) times daily -  before meals and at bedtime.   dorzolamide (TRUSOPT) 2 % ophthalmic solution    escitalopram (LEXAPRO) 5 MG tablet Take 1 tablet (5 mg total) by mouth daily.   ferrous sulfate 325 (65 FE) MG EC tablet Take by mouth.   fluconazole (DIFLUCAN) 200 MG tablet Take 2 tablets (400 mg total) by mouth daily for 1 day, THEN 1 tablet (200 mg total) daily for 13 days.   fluticasone (FLONASE) 50 MCG/ACT nasal spray Place 1 spray into both nostrils daily.   fluticasone-salmeterol (WIXELA INHUB) 250-50 MCG/ACT AEPB Inhale 1 puff into the lungs in the morning and at bedtime.   hydrOXYzine (ATARAX) 10 MG tablet Take 1 tablet (10 mg total) by mouth daily as needed for anxiety.   metoprolol succinate (TOPROL-XL) 25 MG 24 hr tablet Take 1 tablet (25 mg total) by mouth daily.   Multiple Vitamins-Minerals (ONE-A-DAY WOMENS 50 PLUS PO) Take by mouth daily.   nitroGLYCERIN (NITROSTAT) 0.4 MG SL tablet Place under the tongue.   Omega-3 Fatty  Acids (FISH OIL) 1000 MG CAPS 1,000 mg. Take two tablets by mouth daily.   pantoprazole (PROTONIX) 40 MG tablet Take 1 tablet (40 mg total) by mouth daily.   simvastatin (ZOCOR) 40 MG tablet Take 1 tablet (40 mg total) by mouth daily.   VYZULTA 0.024 % SOLN    No facility-administered encounter medications on file as of 06/06/2022.    Past Medical History:  Diagnosis Date   Allergy    Anxiety    Asthma    Atypical chest pain    Cataract    COPD (chronic obstructive pulmonary disease) (HCC)    Coronary artery disease, non-occlusive    a. LHC 6/08 EF 55-60%, 20-30% proximal LAD, LIs CFX, LIs RCA (Harwani); b. ETT-myoview 11/10, 3' stopped due to fatigue and chest tightness, EF normal, probably normal perfusion images with minimal soft  tissue attenuation   Diastolic dysfunction    a. echo 2010: EF 60-65%, GR1DD, no AI, trivial MR, LA nl   GERD (gastroesophageal reflux disease)    Glaucoma    Hyperglycemia    Hyperlipidemia    Hypertension     Past Surgical History:  Procedure Laterality Date   ESOPHAGOGASTRODUODENOSCOPY (EGD) WITH PROPOFOL N/A 05/25/2022   Procedure: ESOPHAGOGASTRODUODENOSCOPY (EGD) WITH PROPOFOL;  Surgeon: Lin Landsman, MD;  Location: [facility] ENDOSCOPY;  Service: Gastroenterology;  Laterality: N/A;  RIDE IS ABOUT 20 MINUTES OUT   TONSILLECTOMY      Family History  Problem Relation Age of Onset   Uterine cancer Mother    Heart attack Father    Pulmonary embolism Brother    Hepatitis Brother    Coronary artery disease Brother    Coronary artery disease Other        Aunt    Social History   Socioeconomic History   Marital status: Single    Spouse name: Not on file   Number of children: Not on file   Years of education: Not on file   Highest education level: Not on file  Occupational History   Occupation: Retired    Fish farm manager: RETIRED  Tobacco Use   Smoking status: Former    Packs/day: 0.50    Years: 5.00    Total pack years: 2.50    Types: Cigarettes    Quit date: 09/17/1978    Years since quitting: 43.7   Smokeless tobacco: Never   Tobacco comments:    unsure how long she smoked for--10/27/2020  Vaping Use   Vaping Use: Never used  Substance and Sexual Activity   Alcohol use: No   Drug use: No   Sexual activity: Not Currently    Partners: Male    Birth control/protection: Post-menopausal  Other Topics Concern   Not on file  Social History Narrative   Divorced   Lives alone   No children   Does not get regular exercise   Social Determinants of Health   Financial Resource Strain: Not on file  Food Insecurity: Not on file  Transportation Needs: Not on file  Physical Activity: Not on file  Stress: Not on file  Social Connections: Not on file  Intimate Partner  Violence: Not on file    Review of Systems  Constitutional:  Negative for chills and fever.  Respiratory:  Negative for cough and wheezing.   Cardiovascular:  Negative for chest pain.  Gastrointestinal:  Positive for abdominal pain and heartburn. Negative for nausea and vomiting.  Genitourinary:  Negative for dysuria and hematuria.  Neurological:  Negative for dizziness and  headaches.        Objective    BP 120/68   Pulse 68   Temp 97.6 F (36.4 C)   Resp 16   Ht '5\' 5"'$  (1.651 m)   Wt 202 lb (91.6 kg)   SpO2 94%   BMI 33.61 kg/m   Physical Exam Constitutional:      Appearance: Normal appearance.  HENT:     Head: Normocephalic and atraumatic.  Eyes:     Conjunctiva/sclera: Conjunctivae normal.  Cardiovascular:     Rate and Rhythm: Normal rate and regular rhythm.  Pulmonary:     Effort: Pulmonary effort is normal.     Breath sounds: Normal breath sounds.  Abdominal:     Comments: Epigastric pain to palpation   Musculoskeletal:     Right lower leg: No edema.     Left lower leg: No edema.  Skin:    General: Skin is warm and dry.  Neurological:     General: No focal deficit present.     Mental Status: She is alert. Mental status is at baseline.  Psychiatric:        Mood and Affect: Mood normal.        Behavior: Behavior normal.       Assessment & Plan:   1. Anxiety: Lexapro increased to 10 mg daily. Continue Buspar as needed. Psychiatry appointment scheduled for next month. Follow up scheduled in November.   - escitalopram (LEXAPRO) 10 MG tablet; Take 1 tablet (10 mg total) by mouth daily.  Dispense: 90 tablet; Refill: 0   Return for already scheduled .   Teodora Medici, DO

## 2022-06-17 NOTE — Progress Notes (Deleted)
Psychiatric Initial Adult Assessment   Patient Identification: Kellie Phelps MRN:  [id number] Date of Evaluation:  06/17/2022 Referral Source: *** Chief Complaint:  No chief complaint on file.  Visit Diagnosis: No diagnosis found.  History of Present Illness:   Kellie Phelps is a 76 y.o. year old female with a history of anxiety, COPD, CAD, diastolic dysfunction. hypertension, hyperlipidemia, GERD, who is referred for   Daily routine: Diet:  Exercise: Support: Household:  Marital status: Number of children: Employment:  Education:   Last PCP / ongoing medical evaluation:       Associated Signs/Symptoms: Depression Symptoms:  {DEPRESSION SYMPTOMS:20000} (Hypo) Manic Symptoms:  {BHH MANIC SYMPTOMS:22872} Anxiety Symptoms:  {BHH ANXIETY SYMPTOMS:22873} Psychotic Symptoms:  {BHH PSYCHOTIC SYMPTOMS:22874} PTSD Symptoms: {BHH PTSD SYMPTOMS:22875}  Past Psychiatric History:  Outpatient:  Psychiatry admission:  Previous suicide attempt:  Past trials of medication:  History of violence:    Previous Psychotropic Medications: {YES/NO:21197}  Substance Abuse History in the last 12 months:  {yes no:314532}  Consequences of Substance Abuse: {BHH CONSEQUENCES OF SUBSTANCE ABUSE:22880}  Past Medical History:  Past Medical History:  Diagnosis Date   Allergy    Anxiety    Asthma    Atypical chest pain    Cataract    COPD (chronic obstructive pulmonary disease) (HCC)    Coronary artery disease, non-occlusive    a. LHC 6/08 EF 55-60%, 20-30% proximal LAD, LIs CFX, LIs RCA (Harwani); b. ETT-myoview 11/10, 3' stopped due to fatigue and chest tightness, EF normal, probably normal perfusion images with minimal soft tissue attenuation   Diastolic dysfunction    a. echo 2010: EF 60-65%, GR1DD, no AI, trivial MR, LA nl   GERD (gastroesophageal reflux disease)    Glaucoma    Hyperglycemia    Hyperlipidemia    Hypertension     Past Surgical History:   Procedure Laterality Date   ESOPHAGOGASTRODUODENOSCOPY (EGD) WITH PROPOFOL N/A 05/25/2022   Procedure: ESOPHAGOGASTRODUODENOSCOPY (EGD) WITH PROPOFOL;  Surgeon: Lin Landsman, MD;  Location: [facility] ENDOSCOPY;  Service: Gastroenterology;  Laterality: N/A;  RIDE IS ABOUT 20 MINUTES OUT   TONSILLECTOMY      Family Psychiatric History: ***  Family History:  Family History  Problem Relation Age of Onset   Uterine cancer Mother    Heart attack Father    Pulmonary embolism Brother    Hepatitis Brother    Coronary artery disease Brother    Coronary artery disease Other        Aunt    Social History:   Social History   Socioeconomic History   Marital status: Single    Spouse name: Not on file   Number of children: Not on file   Years of education: Not on file   Highest education level: Not on file  Occupational History   Occupation: Retired    Fish farm manager: RETIRED  Tobacco Use   Smoking status: Former    Packs/day: 0.50    Years: 5.00    Total pack years: 2.50    Types: Cigarettes    Quit date: 09/17/1978    Years since quitting: 43.7   Smokeless tobacco: Never   Tobacco comments:    unsure how long she smoked for--10/27/2020  Vaping Use   Vaping Use: Never used  Substance and Sexual Activity   Alcohol use: No   Drug use: No   Sexual activity: Not Currently    Partners: Male    Birth control/protection: Post-menopausal  Other Topics Concern   Not on  file  Social History Narrative   Divorced   Lives alone   No children   Does not get regular exercise   Social Determinants of Health   Financial Resource Strain: Not on file  Food Insecurity: Not on file  Transportation Needs: Not on file  Physical Activity: Not on file  Stress: Not on file  Social Connections: Not on file    Additional Social History: ***  Allergies:   Allergies  Allergen Reactions   Penicillins Hives    Metabolic Disorder Labs: Lab Results  Component Value Date   HGBA1C 5.8 (H)  01/18/2022   MPG 120 01/18/2022   No results found for: "PROLACTIN" Lab Results  Component Value Date   CHOL 143 01/18/2022   TRIG 128 01/18/2022   HDL 47 (L) 01/18/2022   CHOLHDL 3.0 01/18/2022   VLDL 27.6 10/17/2012   [place] 75 01/18/2022   LDLCALC 67 03/31/2015   No results found for: "TSH"  Therapeutic Level Labs: No results found for: "LITHIUM" No results found for: "CBMZ" No results found for: "VALPROATE"  Current Medications: Current Outpatient Medications  Medication Sig Dispense Refill   albuterol (PROAIR HFA) 108 (90 Base) MCG/ACT inhaler Inhale 2 puffs into the lungs every 6 (six) hours as needed. Wheezing or shortness of breath. 18 g 10   amLODipine (NORVASC) 5 MG tablet Take by mouth.     aspirin 81 MG tablet Take 81 mg by mouth daily.     busPIRone (BUSPAR) 5 MG tablet Take 1 tablet (5 mg total) by mouth 2 (two) times daily. 90 tablet 2   Calcium Carbonate-Vitamin D 600-200 MG-UNIT TABS Take by mouth.     dicyclomine (BENTYL) 10 MG capsule Take 1 capsule (10 mg total) by mouth 4 (four) times daily -  before meals and at bedtime. 60 capsule 0   dorzolamide (TRUSOPT) 2 % ophthalmic solution      escitalopram (LEXAPRO) 10 MG tablet Take 1 tablet (10 mg total) by mouth daily. 90 tablet 0   ferrous sulfate 325 (65 FE) MG EC tablet Take by mouth.     fluticasone (FLONASE) 50 MCG/ACT nasal spray Place 1 spray into both nostrils daily. 15.8 mL 1   fluticasone-salmeterol (WIXELA INHUB) 250-50 MCG/ACT AEPB Inhale 1 puff into the lungs in the morning and at bedtime. 1 each 2   metoprolol succinate (TOPROL-XL) 25 MG 24 hr tablet Take 1 tablet (25 mg total) by mouth daily. 90 tablet 1   Multiple Vitamins-Minerals (ONE-A-DAY WOMENS 50 PLUS PO) Take by mouth daily.     nitroGLYCERIN (NITROSTAT) 0.4 MG SL tablet Place under the tongue.     Omega-3 Fatty Acids (FISH OIL) 1000 MG CAPS 1,000 mg. Take two tablets by mouth daily.     pantoprazole (PROTONIX) 40 MG tablet Take 1 tablet  (40 mg total) by mouth daily. 30 tablet 3   simvastatin (ZOCOR) 40 MG tablet Take 1 tablet (40 mg total) by mouth daily. 90 tablet 1   VYZULTA 0.024 % SOLN      No current facility-administered medications for this visit.    Musculoskeletal: Strength & Muscle Tone: within normal limits Gait & Station: normal Patient leans: N/A  Psychiatric Specialty Exam: Review of Systems  There were no vitals taken for this visit.There is no height or weight on file to calculate BMI.  General Appearance: {Appearance:22683}  Eye Contact:  {BHH EYE CONTACT:22684}  Speech:  Clear and Coherent  Volume:  Normal  Mood:  {BHH MOOD:22306}  Affect:  {Affect (PAA):22687}  Thought Process:  Coherent  Orientation:  Full (Time, Place, and Person)  Thought Content:  Logical  Suicidal Thoughts:  {ST/HT (PAA):22692}  Homicidal Thoughts:  {ST/HT (PAA):22692}  Memory:  Immediate;   Good  Judgement:  {Judgement (PAA):22694}  Insight:  {Insight (PAA):22695}  Psychomotor Activity:  Normal  Concentration:  Concentration: Good and Attention Span: Good  Recall:  Good  Fund of Knowledge:Good  Language: Good  Akathisia:  No  Handed:  Right  AIMS (if indicated):  not done  Assets:  Communication Skills Desire for Improvement  ADL's:  Intact  Cognition: WNL  Sleep:  {BHH GOOD/FAIR/POOR:22877}   Screenings: GAD-7    Flowsheet Row Office Visit from 06/06/2022 in [facility]  Total GAD-7 Score 2      PHQ2-9    [place] Office Visit from 06/06/2022 in [facility] Office Visit from 04/25/2022 in [facility] Office Visit from 01/18/2022 in [facility]  PHQ-2 Total Score 0 2 1  PHQ-9 Total Score '1 2 1      '$ Flowsheet Row Admission (Discharged) from 05/25/2022 in [place] ED from 05/18/2022 in [place] ED from 03/12/2022 in [place] No Risk No Risk No Risk       Assessment and Plan:  Assessment  Plan   The patient demonstrates the following risk factors for suicide: Chronic risk factors for suicide include: {Chronic Risk Factors for TDHRCBU:38453646}. Acute risk factors for suicide include: {Acute Risk Factors for OEHOZYY:48250037}. Protective factors for this patient include: {Protective Factors for Suicide CWUG:89169450}. Considering these factors, the overall suicide risk at this point appears to be {Desc; low/moderate/high:110033}. Patient {ACTION; IS/IS TUU:82800349} appropriate for outpatient follow up.     Collaboration of Care: {BH OP Collaboration of Care:21014065}  Patient/Guardian was advised Release of Information must be obtained prior to any record release in order to collaborate their care with an outside provider. Patient/Guardian was advised if they have not already done so to contact the registration department to sign all necessary forms in order for [place] to release information regarding their care.   Consent: Patient/Guardian gives verbal consent for treatment and assignment of benefits for services provided during this visit. Patient/Guardian expressed understanding and agreed to proceed.   Norman Clay, MD 10/1/20234:41 PM

## 2022-06-19 ENCOUNTER — Other Ambulatory Visit: Payer: Self-pay | Admitting: Internal Medicine

## 2022-06-19 ENCOUNTER — Ambulatory Visit: Payer: No Typology Code available for payment source | Admitting: Psychiatry

## 2022-06-19 DIAGNOSIS — Z1231 Encounter for screening mammogram for malignant neoplasm of breast: Secondary | ICD-10-CM

## 2022-06-21 ENCOUNTER — Ambulatory Visit (INDEPENDENT_AMBULATORY_CARE_PROVIDER_SITE_OTHER): Payer: Medicare Other | Admitting: Family Medicine

## 2022-06-21 ENCOUNTER — Encounter: Payer: Self-pay | Admitting: Family Medicine

## 2022-06-21 VITALS — BP 128/74 | HR 71 | Temp 98.1°F | Resp 16 | Ht 65.0 in | Wt 202.4 lb

## 2022-06-21 DIAGNOSIS — H9203 Otalgia, bilateral: Secondary | ICD-10-CM | POA: Diagnosis not present

## 2022-06-21 DIAGNOSIS — J329 Chronic sinusitis, unspecified: Secondary | ICD-10-CM

## 2022-06-21 DIAGNOSIS — J302 Other seasonal allergic rhinitis: Secondary | ICD-10-CM | POA: Diagnosis not present

## 2022-06-21 DIAGNOSIS — J441 Chronic obstructive pulmonary disease with (acute) exacerbation: Secondary | ICD-10-CM | POA: Diagnosis not present

## 2022-06-21 MED ORDER — CARBAMIDE PEROXIDE 6.5 % OT SOLN: 5.0000 [drp] | Freq: Two times a day (BID) | OTIC | 0 refills | Status: AC

## 2022-06-21 MED ORDER — BENZONATATE 100 MG PO CAPS: 100.0000 mg | ORAL_CAPSULE | Freq: Three times a day (TID) | ORAL | 0 refills | Status: DC | PRN

## 2022-06-21 MED ORDER — PREDNISONE 20 MG PO TABS: 40.0000 mg | ORAL_TABLET | Freq: Every day | ORAL | 0 refills | Status: AC

## 2022-06-21 MED ORDER — DOXYCYCLINE HYCLATE 100 MG PO TABS: 100.0000 mg | ORAL_TABLET | Freq: Two times a day (BID) | ORAL | 0 refills | Status: AC

## 2022-06-21 NOTE — Progress Notes (Signed)
Patient ID: Kellie Phelps, female    DOB: 1946/01/22, 76 y.o.   MRN: [id number]  PCP: Teodora Medici, DO  Chief Complaint  Patient presents with   Ear Pain    Left ear both on going for a week   Headache    Facial pressure around eyes and forehead, pt believes has a sinus infection    Subjective:   Kellie Phelps is a 76 y.o. female, presents to clinic with CC of the following:  HPI  Patient is 76 year old female with history of COPD and seasonal allergies she presents for over a week of worsening congestion, drainage, cough, wheeze she has sinus pain and pressure, sinus HA, runny eyes, feeling that her ears are blocked with associated ear pain worse on L than R, and her COPD has worsened with a cough that is not improved with over-the-counter cough medicines She reports history of similar flares especially around this time a year and fall she does not take allergy medication she does stays inside to avoid it triggering her allergies and COPD She states she usually needs antibiotics for sinus infection She denies fever, sick contacts, chest pain   Patient Active Problem List   Diagnosis Date Noted   Mild intermittent asthma without complication 56/97/9480   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 Wheezing or shortness of breath., Disp: 18 g, Rfl: 10   amLODipine (NORVASC) 5 MG tablet, Take by mouth., Disp: , Rfl:    aspirin 81 MG tablet, Take 81 mg by  mouth daily., Disp: , Rfl:    benzonatate (TESSALON) 100 MG capsule, Take 1 capsule (100 mg total) by mouth 3 (three) times daily as needed for cough., Disp: 30 capsule, Rfl: 0   Calcium Carbonate-Vitamin D 600-200 MG-UNIT TABS, Take by mouth., Disp: , Rfl:    carbamide peroxide (DEBROX) 6.5 % OTIC solution, Place 5 drops into both ears 2 (two) times daily for 3 days., Disp: 15 mL, Rfl: 0   dorzolamide (TRUSOPT) 2 % ophthalmic solution, , Disp: , Rfl:    doxycycline (VIBRA-TABS) 100 MG tablet, Take 1 tablet (100 mg total) by mouth 2 (two) times daily for 7 days., Disp: 14 tablet, Rfl: 0   escitalopram (LEXAPRO) 10 MG tablet, Take 1 tablet (10 mg total) by mouth daily., Disp: 90 tablet, Rfl: 0   ferrous sulfate 325 (65 FE) MG EC tablet, Take by mouth., Disp: , Rfl:    fluticasone (FLONASE) 50 MCG/ACT nasal spray, Place 1 spray into both nostrils daily., Disp: 15.8 mL, Rfl: 1   fluticasone-salmeterol (WIXELA INHUB) 250-50 MCG/ACT AEPB, Inhale 1 puff into the lungs in the morning and at bedtime., Disp: 1 each, Rfl: 2   metoprolol succinate (TOPROL-XL) 25 MG 24 hr tablet, Take 1 tablet (25 mg total) by mouth daily., Disp: 90 tablet, Rfl: 1   Multiple Vitamins-Minerals (ONE-A-DAY WOMENS 50 PLUS PO), Take by mouth daily., Disp: , Rfl:    nitroGLYCERIN (NITROSTAT) 0.4 MG SL  tablet, Place under the tongue., Disp: , Rfl:    Omega-3 Fatty Acids (FISH OIL) 1000 MG CAPS, 1,000 mg. Take two tablets by mouth daily., Disp: , Rfl:    pantoprazole (PROTONIX) 40 MG tablet, Take 1 tablet (40 mg total) by mouth daily., Disp: 30 tablet, Rfl: 3   predniSONE (DELTASONE) 20 MG tablet, Take 2 tablets (40 mg total) by mouth daily with breakfast for 5 days., Disp: 10 tablet, Rfl: 0   simvastatin (ZOCOR) 40 MG tablet, Take 1 tablet (40 mg total) by mouth daily., Disp: 90 tablet, Rfl: 1   VYZULTA 0.024 % SOLN, , Disp: , Rfl:    busPIRone (BUSPAR) 5 MG tablet, Take 1 tablet (5 mg total) by mouth 2 (two) times daily. (Patient  not taking: Reported on 06/21/2022), Disp: 90 tablet, Rfl: 2   dicyclomine (BENTYL) 10 MG capsule, Take 1 capsule (10 mg total) by mouth 4 (four) times daily -  before meals and at bedtime. (Patient not taking: Reported on 06/21/2022), Disp: 60 capsule, Rfl: 0   Allergies  Allergen Reactions   Penicillins Hives     Social History   Tobacco Use   Smoking status: Former    Packs/day: 0.50    Years: 5.00    Total pack years: 2.50    Types: Cigarettes    Quit date: 09/17/1978    Years since quitting: 43.7   Smokeless tobacco: Never   Tobacco comments:    unsure how long she smoked for--10/27/2020  Vaping Use   Vaping Use: Never used  Substance Use Topics   Alcohol use: No   Drug use: No      Chart Review Today: I personally reviewed active problem list, medication list, allergies, family history, social history, health maintenance, notes from last encounter, lab results, imaging with the patient/caregiver today.   Review of Systems  Constitutional: Negative.   HENT: Negative.    Eyes: Negative.   Respiratory: Negative.    Cardiovascular: Negative.   Gastrointestinal: Negative.   Endocrine: Negative.   Genitourinary: Negative.   Musculoskeletal: Negative.   Skin: Negative.   Allergic/Immunologic: Negative.   Neurological: Negative.   Hematological: Negative.   Psychiatric/Behavioral: Negative.    All other systems reviewed and are negative.      Objective:   Vitals:   06/21/22 1111 06/21/22 1113  BP: 138/80 128/74  Pulse: 71   Resp: 16   Temp: 98.1 F (36.7 C)   TempSrc: Oral   SpO2: 92%   Weight: 202 lb 6.4 oz (91.8 kg)   Height: '5\' 5"'$  (1.651 m)     Body mass index is 33.68 kg/m.  Physical Exam Vitals and nursing note reviewed.  Constitutional:      General: She is not in acute distress.    Appearance: She is well-developed. She is obese. She is ill-appearing (mildly ill appearing w/o distress). She is not toxic-appearing or diaphoretic.  HENT:      Head: Normocephalic and atraumatic.     Right Ear: Hearing and external ear normal. No tenderness. No mastoid tenderness.     Left Ear: Hearing and external ear normal. No tenderness. No mastoid tenderness.     Ears:     Comments: Unable to visualize TM bilaterally Pt did not tolerate exam of ears very well, refused irrigation Small canals bilaterally with moderate wax, no erythema in canal noted No tragus or pinna tenderness, no drainage No preauricular or postauricular lymphadenopathy    Nose: Mucosal edema, congestion and rhinorrhea present.  Right Sinus: Maxillary sinus tenderness and frontal sinus tenderness present.     Left Sinus: Maxillary sinus tenderness and frontal sinus tenderness present.     Mouth/Throat:     Mouth: Mucous membranes are moist. Mucous membranes are not pale.     Pharynx: Uvula midline. Posterior oropharyngeal erythema present. No pharyngeal swelling, oropharyngeal exudate or uvula swelling.     Tonsils: No tonsillar exudate or tonsillar abscesses. 0 on the right. 0 on the left.  Eyes:     Extraocular Movements: Extraocular movements intact.     Conjunctiva/sclera:     Right eye: Right conjunctiva is injected. No exudate.    Left eye: Left conjunctiva is injected. No exudate.    Pupils: Pupils are equal, round, and reactive to light.     Comments: Both eyes watery with mildly irritated appearing eyelids w/o edema or preorbital erythema  Neck:     Trachea: No tracheal deviation.  Cardiovascular:     Rate and Rhythm: Normal rate and regular rhythm.     Pulses: Normal pulses.     Heart sounds: Normal heart sounds.  Pulmonary:     Effort: Pulmonary effort is normal. No respiratory distress.     Breath sounds: No stridor. Wheezing and rhonchi present. No rales.  Abdominal:     General: Bowel sounds are normal. There is no distension.     Palpations: Abdomen is soft.  Musculoskeletal:        General: Normal range of motion.     Cervical back: Normal  range of motion.  Skin:    General: Skin is warm and dry.     Coloration: Skin is not pale.     Findings: No rash.  Neurological:     Mental Status: She is alert.     Motor: No abnormal muscle tone.     Coordination: Coordination normal.  Psychiatric:        Behavior: Behavior normal.      Results for orders placed or performed during the hospital encounter of 05/25/22  Surgical pathology  Result Value Ref Range   SURGICAL PATHOLOGY      SURGICAL PATHOLOGY CASE: [id number] PATIENT: Laurie Panda Surgical Pathology Report     Specimen Submitted: A. Stomach, random; cbx B. Esophagus; cbx  Clinical History: Chronic GERD      DIAGNOSIS: A.  STOMACH, RANDOM; COLD BIOPSY: - OXYNTIC MUCOSA WITH CHANGES CONSISTENT WITH PROTON PUMP INHIBITOR EFFECT. - UNREMARKABLE ANTRAL MUCOSA. - NEGATIVE FOR H. PYLORI, INTESTINAL METAPLASIA, DYSPLASIA, AND MALIGNANCY.  B.  ESOPHAGUS; COLD BIOPSY: - ESOPHAGEAL CANDIDIASIS. - NEGATIVE FOR DYSPLASIA AND MALIGNANCY.  GROSS DESCRIPTION: A. Labeled: cbx random stomach for gastric erythema Received: Formalin Collection time: 9:32 AM on 05/25/2022 Placed into formalin time: 9:32 AM on 05/25/2022 Tissue fragment(s): 4 Size: Aggregate, 1 x 0.6 x 0.2 cm Description: Tan soft tissue fragments Entirely submitted in 1 cassette.  B. Labeled: cbx esophagus for GERD Received: Formalin Collection time: 9:45 AM on 05/25/2022 Placed into formalin time: 9:35 AM on 05/26/19 23 Tissue fragment(s): Multiple Size: Aggregate, 1.5 x 0.4 x 0.1 cm Description: White-tan translucent soft tissue fragments Entirely submitted in 1 cassette.  RB 05/25/2022  Final Diagnosis performed by Quay Burow, MD.   Electronically signed 05/28/2022 3:52:08PM The electronic signature indicates that the named Attending Pathologist has evaluated the specimen Technical component performed at [facility], [address], [place], [place] [address] Lab: [id number] Dir: Rush Farmer, MD, MMM  Professional component performed at [facility], [facility], [phone]  [place], [place], [place] [address] Lab: [phone] Dir: Kathi Simpers, MD        Assessment & Plan:   Pt 76 y/o female presents with 1+ week of URI/allergy sx with worsening sinus pain/congestion/drainage and COPD exacerbation She appears mildly ill - with runny eyes and congestion but otherwise well appearing and NAD She has generalized tenderness on exam out or proportion for what is expected - with sinus ttp, checking lymph nodes and ear - and thus exam was somewhat limited by her pain/tolerance  She declined breathing tx and ear irrigation in office Will start treatment and have her return in 5-7 d for recheck - esp of ears since unable to visualize bilateraly TM  1. Rhinosinusitis 1+_ week of sx with tenderness on exam will tx with abx for acute bacterial sinusitis - very clearly underlying allergies that she is not treating - encouraged her to take antihistamine and steroid nose spray as well - doxycycline (VIBRA-TABS) 100 MG tablet; Take 1 tablet (100 mg total) by mouth 2 (two) times daily for 7 days.  Dispense: 14 tablet; Refill: 0  2. COPD with acute exacerbation (HCC) Coughing during visit, some scattered rhonchi and exp wheeze, no distress Tx with steroid burst, mucinex, cough meds and inhalers - predniSONE (DELTASONE) 20 MG tablet; Take 2 tablets (40 mg total) by mouth daily with breakfast for 5 days.  Dispense: 10 tablet; Refill: 0 - benzonatate (TESSALON) 100 MG capsule; Take 1 capsule (100 mg total) by mouth 3 (three) times daily as needed for cough.  Dispense: 30 capsule; Refill: 0  3. Seasonal allergies Advised to tx her allergies to avoid sinusitis and COPD exacerbations - antihistamines and steroid nasal sprays recommended  4. Otalgia of both ears No otitis externa on exam, unable to eval TM b/ls She refused irrigation Will have her tx  sinusitis/allergies/copd exacerbation and do ear wax softening drops for a few days prior to returning next week to recheck ears and irrigate then if needed. - carbamide peroxide (DEBROX) 6.5 % OTIC solution; Place 5 drops into both ears 2 (two) times daily for 3 days.  Dispense: 15 mL; Refill: 0   F/up next M/Tu for ear recheck    Delsa Grana, PA-C 06/21/22 11:38 AM

## 2022-06-21 NOTE — Patient Instructions (Signed)
Start allergy medications once daily - like claritin or zyrtec one pill at bedtime Do the steroids and antibiotics and use cough meds, your inhalers  Do the Debrox ear wax softening drops two times a day for 2-3 days before your follow up appointment. I need you to return for [place] to do the wax removal procedure and reexamine your ears.  You are too tender today to tolerate the procedure, so please return early next week

## 2022-06-29 ENCOUNTER — Encounter: Payer: Self-pay | Admitting: Family Medicine

## 2022-06-29 ENCOUNTER — Ambulatory Visit (INDEPENDENT_AMBULATORY_CARE_PROVIDER_SITE_OTHER): Payer: Medicare Other | Admitting: Family Medicine

## 2022-06-29 VITALS — BP 136/72 | HR 70 | Temp 97.9°F | Resp 16 | Ht 65.0 in | Wt 201.0 lb

## 2022-06-29 DIAGNOSIS — J302 Other seasonal allergic rhinitis: Secondary | ICD-10-CM

## 2022-06-29 DIAGNOSIS — J329 Chronic sinusitis, unspecified: Secondary | ICD-10-CM | POA: Diagnosis not present

## 2022-06-29 DIAGNOSIS — T3695XA Adverse effect of unspecified systemic antibiotic, initial encounter: Secondary | ICD-10-CM

## 2022-06-29 DIAGNOSIS — J441 Chronic obstructive pulmonary disease with (acute) exacerbation: Secondary | ICD-10-CM

## 2022-06-29 DIAGNOSIS — H9203 Otalgia, bilateral: Secondary | ICD-10-CM | POA: Diagnosis not present

## 2022-06-29 DIAGNOSIS — H6123 Impacted cerumen, bilateral: Secondary | ICD-10-CM

## 2022-06-29 DIAGNOSIS — B379 Candidiasis, unspecified: Secondary | ICD-10-CM

## 2022-06-29 MED ORDER — FLUCONAZOLE 150 MG PO TABS: 150.0000 mg | ORAL_TABLET | ORAL | 0 refills | Status: DC | PRN

## 2022-06-29 MED ORDER — MOMETASONE FUROATE 50 MCG/ACT NA SUSP: 2.0000 | Freq: Every day | NASAL | 12 refills | Status: DC

## 2022-06-29 MED ORDER — MONTELUKAST SODIUM 10 MG PO TABS: 10.0000 mg | ORAL_TABLET | Freq: Every day | ORAL | 3 refills | Status: DC

## 2022-06-29 MED ORDER — LORATADINE 10 MG PO TABS: 10.0000 mg | ORAL_TABLET | Freq: Every day | ORAL | 11 refills | Status: DC

## 2022-06-29 NOTE — Progress Notes (Signed)
Patient ID: Kellie Phelps, female    DOB: 1946-01-06, 76 y.o.   MRN: [id number]  PCP: Teodora Medici, DO  Chief Complaint  Patient presents with   Follow-up   Ear Pain    Bilateral pain still hurting, pt states got a yeast infection due to antibiotics that were prescribed previously. Pt states ear drops did not help    Subjective:   Kellie Phelps is a 76 y.o. female, presents to clinic with CC of the following:  HPI  Patient presents today for follow-up on her ear pain she was due to return to have irrigation bilaterally but again she refuses She has been doing Debrox drops over-the-counter, she continues to have bilateral ear pain which she states is everywhere around her ear She did not start any allergy or sinus medications but she did take antibiotics and steroids, she continues to have congestion and sinus discomfort and pressure her cough and COPD exacerbation has slightly improved, she is doing breathing treatments  She complains of antibiotic side effects of diarrhea which has resolved and continued vaginal yeast infection  She continues to have eye symptoms and just saw the eye specialist today She continues to endorse nasal congestion sinus pressure, ear pain but she denies associated fever sweats sore throat, myalgia    Patient Active Problem List   Diagnosis Date Noted   Mild intermittent asthma without complication 56/25/6389   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 Wheezing or shortness of breath., Disp: 18 g, Rfl: 10   amLODipine (NORVASC) 5 MG tablet, Take by mouth., Disp: , Rfl:    aspirin 81 MG tablet, Take 81 mg by mouth daily., Disp: , Rfl:    benzonatate (TESSALON) 100 MG capsule, Take 1 capsule (100 mg total) by mouth 3 (three) times daily as needed for cough., Disp: 30 capsule, Rfl: 0   Calcium Carbonate-Vitamin D 600-200 MG-UNIT TABS, Take by mouth., Disp: , Rfl:    dorzolamide (TRUSOPT) 2 % ophthalmic solution, , Disp: , Rfl:    escitalopram (LEXAPRO) 10 MG tablet, Take 1 tablet (10 mg total) by mouth daily., Disp: 90 tablet, Rfl: 0   ferrous sulfate 325 (65 FE) MG EC tablet, Take by mouth., Disp: , Rfl:    fluconazole (DIFLUCAN) 150 MG tablet, Take 1 tablet (150 mg total) by mouth every 3 (three) days as needed (for vaginal itching/yeast infection sx)., Disp: 2 tablet, Rfl: 0   fluticasone-salmeterol (WIXELA INHUB) 250-50 MCG/ACT AEPB, Inhale 1 puff into the lungs in the morning and at bedtime., Disp: 1 each, Rfl: 2   loratadine (CLARITIN) 10 MG tablet, Take 1 tablet (10 mg total) by mouth daily., Disp: 30 tablet, Rfl: 11   metoprolol succinate (TOPROL-XL) 25 MG 24 hr tablet, Take 1 tablet (25 mg total) by mouth daily., Disp: 90 tablet, Rfl: 1   mometasone (NASONEX) 50 MCG/ACT nasal spray, Place 2 sprays into the nose daily., Disp: 1 each, Rfl: 12   Multiple  Vitamins-Minerals (ONE-A-DAY WOMENS 50 PLUS PO), Take by mouth daily., Disp: , Rfl:    nitroGLYCERIN (NITROSTAT) 0.4 MG SL tablet, Place under the tongue., Disp: , Rfl:    Omega-3 Fatty Acids (FISH OIL) 1000 MG CAPS, 1,000 mg. Take two tablets by mouth daily., Disp: , Rfl:    pantoprazole (PROTONIX) 40 MG tablet, Take 1 tablet (40 mg total) by mouth daily., Disp: 30 tablet, Rfl: 3   simvastatin (ZOCOR) 40 MG tablet, Take 1 tablet (40 mg total) by mouth daily., Disp: 90 tablet, Rfl: 1   VYZULTA 0.024 % SOLN, , Disp: , Rfl:    busPIRone (BUSPAR) 5 MG tablet, Take  1 tablet (5 mg total) by mouth 2 (two) times daily. (Patient not taking: Reported on 06/29/2022), Disp: 90 tablet, Rfl: 2   dicyclomine (BENTYL) 10 MG capsule, Take 1 capsule (10 mg total) by mouth 4 (four) times daily -  before meals and at bedtime. (Patient not taking: Reported on 06/29/2022), Disp: 60 capsule, Rfl: 0   Allergies  Allergen Reactions   Penicillins Hives     Social History   Tobacco Use   Smoking status: Former    Packs/day: 0.50    Years: 5.00    Total pack years: 2.50    Types: Cigarettes    Quit date: 09/17/1978    Years since quitting: 43.8   Smokeless tobacco: Never   Tobacco comments:    unsure how long she smoked for--10/27/2020  Vaping Use   Vaping Use: Never used  Substance Use Topics   Alcohol use: No   Drug use: No      Chart Review Today: I personally reviewed active problem list, medication list, allergies, family history, social history, health maintenance, notes from last encounter, lab results, imaging with the patient/caregiver today.   Review of Systems  Constitutional: Negative.   HENT: Negative.    Eyes: Negative.   Respiratory: Negative.    Cardiovascular: Negative.   Gastrointestinal: Negative.   Endocrine: Negative.   Genitourinary: Negative.   Musculoskeletal: Negative.   Skin: Negative.   Allergic/Immunologic: Negative.   Neurological: Negative.   Hematological: Negative.   Psychiatric/Behavioral: Negative.    All other systems reviewed and are negative.      Objective:   Vitals:   06/29/22 1114  BP: 136/72  Pulse: 70  Resp: 16  Temp: 97.9 F (36.6 C)  TempSrc: Oral  SpO2: 95%  Weight: 201 lb (91.2 kg)  Height: '5\' 5"'$  (1.651 m)    Body mass index is 33.45 kg/m.  Physical Exam Constitutional:      General: She is not in acute distress.    Appearance: Normal appearance. She is well-developed. She is obese. She is not ill-appearing, toxic-appearing or diaphoretic.  HENT:     Head: Normocephalic and  atraumatic.     Right Ear: External ear normal. No tenderness. There is impacted cerumen. No mastoid tenderness.     Left Ear: External ear normal. No tenderness. There is impacted cerumen. No mastoid tenderness.     Ears:     Comments: No tenderness with palpation to tragus, preauricular and postauricular area, mastoid bilaterally, no pain with manipulation of pinna or exam on b/l canals, no visible swelling, redness or discharge of outer ear or outer portion of bilateral canal Very narrow canals difficult evaluation, unchanged dry obstructing wax bilaterally yellow to brown Pt not HOH    Nose: Mucosal edema present. No congestion or rhinorrhea.     Right Turbinates: Enlarged and  pale.     Left Turbinates: Enlarged and pale.     Right Sinus: No maxillary sinus tenderness or frontal sinus tenderness.     Left Sinus: No maxillary sinus tenderness or frontal sinus tenderness.     Mouth/Throat:     Lips: Pink.     Mouth: Mucous membranes are moist.     Pharynx: Oropharynx is clear. Uvula midline. No pharyngeal swelling, oropharyngeal exudate, posterior oropharyngeal erythema or uvula swelling.     Tonsils: No tonsillar exudate.  Eyes:     General: No scleral icterus.       Right eye: Discharge present.        Left eye: Discharge present.    Conjunctiva/sclera:     Right eye: Right conjunctiva is injected. No chemosis or exudate.    Left eye: Left conjunctiva is injected. No chemosis or exudate.    Comments: Watery eye b/l,no purulent discharge no lid or surrounding erythema Mild upper eyelid swelling  Neck:     Trachea: Trachea and phonation normal.  Cardiovascular:     Rate and Rhythm: Normal rate and regular rhythm.     Pulses: Normal pulses.     Heart sounds: Normal heart sounds.  Pulmonary:     Effort: Pulmonary effort is normal. No respiratory distress.     Breath sounds: Normal breath sounds. No stridor. No wheezing, rhonchi or rales.  Abdominal:     General: Bowel sounds are  normal.     Palpations: Abdomen is soft.  Musculoskeletal:     Cervical back: Normal range of motion and neck supple.  Lymphadenopathy:     Head:     Right side of head: No submental, submandibular, tonsillar, preauricular or posterior auricular adenopathy.     Left side of head: No submental, submandibular, tonsillar, preauricular or posterior auricular adenopathy.     Cervical: No cervical adenopathy.  Skin:    Coloration: Skin is not jaundiced or pale.  Neurological:     Mental Status: She is alert.  Psychiatric:        Mood and Affect: Mood normal.        Behavior: Behavior is cooperative.         Results for orders placed or performed during the hospital encounter of 05/25/22  Surgical pathology  Result Value Ref Range   SURGICAL PATHOLOGY      SURGICAL PATHOLOGY CASE: [id number] PATIENT: Laurie Panda Surgical Pathology Report     Specimen Submitted: A. Stomach, random; cbx B. Esophagus; cbx  Clinical History: Chronic GERD      DIAGNOSIS: A.  STOMACH, RANDOM; COLD BIOPSY: - OXYNTIC MUCOSA WITH CHANGES CONSISTENT WITH PROTON PUMP INHIBITOR EFFECT. - UNREMARKABLE ANTRAL MUCOSA. - NEGATIVE FOR H. PYLORI, INTESTINAL METAPLASIA, DYSPLASIA, AND MALIGNANCY.  B.  ESOPHAGUS; COLD BIOPSY: - ESOPHAGEAL CANDIDIASIS. - NEGATIVE FOR DYSPLASIA AND MALIGNANCY.  GROSS DESCRIPTION: A. Labeled: cbx random stomach for gastric erythema Received: Formalin Collection time: 9:32 AM on 05/25/2022 Placed into formalin time: 9:32 AM on 05/25/2022 Tissue fragment(s): 4 Size: Aggregate, 1 x 0.6 x 0.2 cm Description: Tan soft tissue fragments Entirely submitted in 1 cassette.  B. Labeled: cbx esophagus for GERD Received: Formalin Collection time: 9:45 AM on 05/25/2022 Placed into formalin time: 9:35 AM on 05/26/19 23 Tissue fragment(s): Multiple Size: Aggregate, 1.5 x 0.4 x 0.1 cm Description: White-tan translucent soft tissue fragments Entirely submitted in 1  cassette.  RB 05/25/2022  Final Diagnosis performed by Quay Burow, MD.   Electronically signed 05/28/2022 3:52:08PM The  electronic signature indicates that the named Attending Pathologist has evaluated the specimen Technical component performed at [facility], [address], [place], [place] [address] Lab: [phone] Dir: Rush Farmer, MD, MMM  Professional component performed at [facility], [facility], [place], [place], [place] [address] Lab: [phone] Dir: Kathi Simpers, MD        Assessment & Plan:   Pt is a 76 y/o female, was seen here on 10/5 (one week ago) for URI sx, cough/wheeze, bilateral otalgia, was tx for sinusitis/bronchitis and instructed to start meds for chronic sinus/nasal/allergy sx which seemed evidently underlying, she also has very narrow ear canal anatomy which was obstructed with wax and she was extremely tender on exam and refused to have irrigation done she was to do the antibiotics and do earwax softening drops and then return for irrigation today.  She continues to have ear pain, on exam today, she was not really tender, and did seem significantly improved compared to 1 week ago, but she still complains of severe tenderness somewhat out of proportion (ie I examined her thoroughly and she did not report ttp and did not wince etc, very normal exam and later she complains of tenderness everywhere when she touched herself outer ears in front of and behind ears and tragus etc).  She did complete the steroids and antibiotics but did not start the other medications as discussed and written on AVS  Her lung exam is significantly better she no longer has rhonchi or wheeze Her nasal mucosa has more pale edema and her posterior pharynx which was very erythematous is now normal-appearing. Her ears bilaterally appear exactly the same, the reason for this appointment was to irrigate her ears but she refuses this so we will refer her to ENT.  She was  again encouraged to use daily 24-hour antihistamine, use a intranasal steroid spray bilaterally daily if tolerated reduce to every other day if any sensitivity, use saline nasal spray, avoid any known allergy triggers, she can try Sudafed if she tolerates this Encouraged her to try these medications for at least 2 weeks consistently and she may notice some symptom improvement but I would not expect all of her symptoms to completely resolve.  If not much improvement she would like to restart Singulair which she has previously been prescribed.  She complained of secondary yeast infection so medication will be sent in for that  She can follow-up with ENT, referral placed  She has PCP follow-up  Overall appears improved compared to a week ago    ICD-10-CM   1. Otalgia of both ears  H92.03 mometasone (NASONEX) 50 MCG/ACT nasal spray    Ambulatory referral to ENT    2. Rhinosinusitis  J32.9 loratadine (CLARITIN) 10 MG tablet    mometasone (NASONEX) 50 MCG/ACT nasal spray    Ambulatory referral to ENT    3. Antibiotic-induced yeast infection  B37.9 fluconazole (DIFLUCAN) 150 MG tablet   T36.95XA     4. Bilateral impacted cerumen  H61.23 Ambulatory referral to ENT    5. COPD with acute exacerbation (HCC)  J44.1    much improved compared to last week, continue inhalers and cough meds as needed, lung CTA b/l A&P     6. Seasonal allergies  J30.2 loratadine (CLARITIN) 10 MG tablet    mometasone (NASONEX) 50 MCG/ACT nasal spray    montelukast (SINGULAIR) 10 MG tablet     Instructions again written out for pt and printed and handed to her Reviewed thoroughly again  option of daily OTC antihistamine and steroid nose sprays- see AVS     Delsa Grana, PA-C 06/29/22 11:48 AM

## 2022-06-29 NOTE — Patient Instructions (Signed)
Make sure you are on the daily allergy meds and nasal sprays You can do sudafed if you tolerate that and do not have bothersome heart symptoms (increased blood pressure or heart rate).  Follow up with ENT - they should call you to make an appointment.  Your lungs sound much better  Allergic Rhinitis, Adult Allergic rhinitis is a reaction to allergens. Allergens are things that can cause an allergic reaction. This condition affects the lining inside the nose (mucous membrane). There are two types of allergic rhinitis: Seasonal. This type is also called hay fever. It happens only during some times of the year. Perennial. This type can happen at any time of the year. This condition cannot be spread from person to person (is not contagious). It can be mild, worse, or very bad. It can develop at any age and may be outgrown. What are the causes? This condition may be caused by: Pollen from grasses, trees, and weeds. Dust mites. Smoke. Mold. Car fumes. The pee (urine), spit, or dander of pets. Dander is dead skin cells from a pet. What increases the risk? You are more likely to develop this condition if: You have allergies in your family. You have problems like allergies in your family. You may have: Swelling of parts of your eyes and eyelids. Asthma. This affects how you breathe. Long-term redness and swelling on your skin. Food allergies. What are the signs or symptoms? The main symptom of this condition is a runny or stuffy nose (nasal congestion). Other symptoms may include: Sneezing or coughing. Itching and tearing of your eyes. Mucus that drips down the back of your throat (postnasal drip). Trouble sleeping. Feeling tired. Headache. Sore throat. How is this treated? There is no cure for this condition. You should avoid things that you are allergic to. Treatment can help to relieve symptoms. This may include: Medicines that block allergy symptoms, such as corticosteroids or  antihistamines. These may be given as a shot, nasal spray, or pill. Avoiding things you are allergic to. Medicines that give you bits of what you are allergic to over time. This is called immunotherapy. It is done if other treatments do not help. You may get: Shots. Medicine under your tongue. Stronger medicines, if other treatments do not help. Follow these instructions at home: Avoiding allergens Find out what things you are allergic to and avoid them. To do this, try these things: If you get allergies any time of year: Replace carpet with wood, tile, or vinyl flooring. Carpet can trap pet dander and dust. Do not smoke. Do not allow smoking in your home. Change your heating and air conditioning filters at least once a month. If you get allergies only some times of the year: Keep windows closed when you can. Plan things to do outside when pollen counts are lowest. Check pollen counts before you plan things to do outside. When you come indoors, change your clothes and shower before you sit on furniture or bedding. If you are allergic to a pet: Keep the pet out of your bedroom. Vacuum, sweep, and dust often.  General instructions Take over-the-counter and prescription medicines only as told by your doctor. Drink enough fluid to keep your pee (urine) pale yellow. Keep all follow-up visits as told by your doctor. This is important. Where to find more information American Academy of Allergy, Asthma & Immunology: www.aaaai.org Contact a doctor if: You have a fever. You get a cough that does not go away. You make whistling sounds when  you breathe (wheeze). Your symptoms slow you down. Your symptoms stop you from doing your normal things each day. Get help right away if: You are short of breath. This symptom may be an emergency. Do not wait to see if the symptom will go away. Get medical help right away. Call your local emergency services (911 in the [place] Do not drive yourself to the  hospital. Summary Allergic rhinitis may be treated by taking medicines and avoiding things you are allergic to. If you have allergies only some of the year, keep windows closed when you can at those times. Contact your doctor if you get a fever or a cough that does not go away. This information is not intended to replace advice given to you by your health care provider. Make sure you discuss any questions you have with your health care provider. Document Revised: 10/26/2019 Document Reviewed: 09/01/2019 Elsevier Patient Education  [place].

## 2022-07-05 ENCOUNTER — Ambulatory Visit (INDEPENDENT_AMBULATORY_CARE_PROVIDER_SITE_OTHER): Payer: Medicare Other

## 2022-07-05 ENCOUNTER — Telehealth: Payer: Self-pay

## 2022-07-05 VITALS — Wt 201.0 lb

## 2022-07-05 DIAGNOSIS — Z0000 Encounter for general adult medical examination without abnormal findings: Secondary | ICD-10-CM

## 2022-07-05 NOTE — Telephone Encounter (Signed)
Attempted to reach patient to complete AWV x2, left VM for return call.

## 2022-07-05 NOTE — Progress Notes (Signed)
Subjective:  I connected with  Kellie Phelps on 07/05/22 by a audio enabled telemedicine application and verified that I am speaking with the correct person using two identifiers.  Patient Location: Home  Provider Location: Office/Clinic  I discussed the limitations of evaluation and management by telemedicine. The patient expressed understanding and agreed to proceed.  Kellie Phelps is a 76 y.o. female who presents for Medicare Annual (Subsequent) preventive examination.  Review of Systems    Defer to PCP       Objective:    There were no vitals filed for this visit. There is no height or weight on file to calculate BMI.     05/25/2022    8:24 AM 05/17/2022   11:18 PM 03/12/2022    7:29 PM  Advanced Directives  Does Patient Have a Medical Advance Directive? No No No  Would patient like information on creating a medical advance directive?  No - Patient declined     Current Medications (verified) Outpatient Encounter Medications as of 07/05/2022  Medication Sig   albuterol (PROAIR HFA) 108 (90 Base) MCG/ACT inhaler Inhale 2 puffs into the lungs every 6 (six) hours as needed. Wheezing or shortness of breath.   amLODipine (NORVASC) 5 MG tablet Take by mouth.   aspirin 81 MG tablet Take 81 mg by mouth daily.   benzonatate (TESSALON) 100 MG capsule Take 1 capsule (100 mg total) by mouth 3 (three) times daily as needed for cough.   busPIRone (BUSPAR) 5 MG tablet Take 1 tablet (5 mg total) by mouth 2 (two) times daily. (Patient not taking: Reported on 06/29/2022)   Calcium Carbonate-Vitamin D 600-200 MG-UNIT TABS Take by mouth.   dicyclomine (BENTYL) 10 MG capsule Take 1 capsule (10 mg total) by mouth 4 (four) times daily -  before meals and at bedtime. (Patient not taking: Reported on 06/29/2022)   dorzolamide (TRUSOPT) 2 % ophthalmic solution    escitalopram (LEXAPRO) 10 MG tablet Take 1 tablet (10 mg total) by mouth daily.   ferrous sulfate 325 (65 FE) MG EC  tablet Take by mouth.   fluconazole (DIFLUCAN) 150 MG tablet Take 1 tablet (150 mg total) by mouth every 3 (three) days as needed (for vaginal itching/yeast infection sx).   fluticasone-salmeterol (WIXELA INHUB) 250-50 MCG/ACT AEPB Inhale 1 puff into the lungs in the morning and at bedtime.   loratadine (CLARITIN) 10 MG tablet Take 1 tablet (10 mg total) by mouth daily.   metoprolol succinate (TOPROL-XL) 25 MG 24 hr tablet Take 1 tablet (25 mg total) by mouth daily.   mometasone (NASONEX) 50 MCG/ACT nasal spray Place 2 sprays into the nose daily.   montelukast (SINGULAIR) 10 MG tablet Take 1 tablet (10 mg total) by mouth at bedtime.   Multiple Vitamins-Minerals (ONE-A-DAY WOMENS 50 PLUS PO) Take by mouth daily.   nitroGLYCERIN (NITROSTAT) 0.4 MG SL tablet Place under the tongue.   Omega-3 Fatty Acids (FISH OIL) 1000 MG CAPS 1,000 mg. Take two tablets by mouth daily.   pantoprazole (PROTONIX) 40 MG tablet Take 1 tablet (40 mg total) by mouth daily.   simvastatin (ZOCOR) 40 MG tablet Take 1 tablet (40 mg total) by mouth daily.   VYZULTA 0.024 % SOLN    No facility-administered encounter medications on file as of 07/05/2022.    Allergies (verified) Penicillins   History: Past Medical History:  Diagnosis Date   Allergy    Anxiety    Asthma    Atypical chest pain  Cataract    COPD (chronic obstructive pulmonary disease) (HCC)    Coronary artery disease, non-occlusive    a. LHC 6/08 EF 55-60%, 20-30% proximal LAD, LIs CFX, LIs RCA (Harwani); b. ETT-myoview 11/10, 3' stopped due to fatigue and chest tightness, EF normal, probably normal perfusion images with minimal soft tissue attenuation   Diastolic dysfunction    a. echo 2010: EF 60-65%, GR1DD, no AI, trivial MR, LA nl   GERD (gastroesophageal reflux disease)    Glaucoma    Hyperglycemia    Hyperlipidemia    Hypertension    Past Surgical History:  Procedure Laterality Date   ESOPHAGOGASTRODUODENOSCOPY (EGD) WITH PROPOFOL N/A  05/25/2022   Procedure: ESOPHAGOGASTRODUODENOSCOPY (EGD) WITH PROPOFOL;  Surgeon: Lin Landsman, MD;  Location: [facility] ENDOSCOPY;  Service: Gastroenterology;  Laterality: N/A;  RIDE IS ABOUT 20 MINUTES OUT   TONSILLECTOMY     Family History  Problem Relation Age of Onset   Uterine cancer Mother    Heart attack Father    Pulmonary embolism Brother    Hepatitis Brother    Coronary artery disease Brother    Coronary artery disease Other        Aunt   Social History   Socioeconomic History   Marital status: Single    Spouse name: Not on file   Number of children: Not on file   Years of education: Not on file   Highest education level: Not on file  Occupational History   Occupation: Retired    Fish farm manager: RETIRED  Tobacco Use   Smoking status: Former    Packs/day: 0.50    Years: 5.00    Total pack years: 2.50    Types: Cigarettes    Quit date: 09/17/1978    Years since quitting: 43.8   Smokeless tobacco: Never   Tobacco comments:    unsure how long she smoked for--10/27/2020  Vaping Use   Vaping Use: Never used  Substance and Sexual Activity   Alcohol use: No   Drug use: No   Sexual activity: Not Currently    Partners: Male    Birth control/protection: Post-menopausal  Other Topics Concern   Not on file  Social History Narrative   Divorced   Lives alone   No children   Does not get regular exercise   Social Determinants of Health   Financial Resource Strain: Not on file  Food Insecurity: Not on file  Transportation Needs: Not on file  Physical Activity: Not on file  Stress: Not on file  Social Connections: Not on file    Tobacco Counseling Counseling given: Not Answered Tobacco comments: unsure how long she smoked for--10/27/2020   Clinical Intake:                 Diabetic?No          Activities of Daily Living    06/29/2022   11:16 AM 06/21/2022   11:12 AM  In your present state of health, do you have any difficulty performing the  following activities:  Hearing? 0 0  Vision? 0 0  Difficulty concentrating or making decisions? 0 0  Walking or climbing stairs? 0 0  Dressing or bathing? 0 0  Doing errands, shopping? 0 0    Patient Care Team: Teodora Medici, DO as PCP - General (Internal Medicine) Minna Merritts, MD as Consulting Physician (Cardiology)  Indicate any recent Medical Services you may have received from other than [facility] providers in the past year (date may be approximate).  Assessment:   This is a routine wellness examination for [place].  Hearing/Vision screen No results found.  Dietary issues and exercise activities discussed:     Goals Addressed   None   Depression Screen    06/29/2022   11:16 AM 06/21/2022   11:12 AM 06/06/2022    9:06 AM 04/25/2022    9:56 AM 01/18/2022    1:06 PM  PHQ 2/9 Scores  PHQ - 2 Score 0 0 0 2 1  PHQ- 9 Score 0 0 '1 2 1    '$ Fall Risk    06/29/2022   11:15 AM 06/21/2022   11:12 AM 06/06/2022    9:00 AM 04/25/2022    9:56 AM 01/18/2022    1:06 PM  Fall Risk   Falls in the past year? 0 0 0 0 0  Number falls in past yr: 0 0 0 0 0  Injury with Fall? 0 0 0 0 0  Risk for fall due to : No Fall Risks No Fall Risks     Follow up Falls prevention discussed;Education provided Falls prevention discussed;Education provided       FALL RISK PREVENTION PERTAINING TO THE HOME:  Any stairs in or around the home? No  If so, are there any without handrails? No  Home free of loose throw rugs in walkways, pet beds, electrical cords, etc? Yes  Adequate lighting in your home to reduce risk of falls? Yes   ASSISTIVE DEVICES UTILIZED TO PREVENT FALLS:  Life Phelps? No  Use of a cane, walker or w/c? No  Grab bars in the bathroom? Yes  Shower chair or bench in shower? Yes  Elevated toilet seat or a handicapped toilet? No   TIMED UP AND GO:  Was the test performed? No .  Length of time to ambulate 10 feet: n/a sec.     Cognitive Function:         Immunizations  There is no immunization history on file for this patient.  TDAP status: Due, Education has been provided regarding the importance of this vaccine. Advised may receive this vaccine at local pharmacy or Health Dept. Aware to provide a copy of the vaccination record if obtained from local pharmacy or Health Dept. Verbalized acceptance and understanding.  Flu Vaccine status: Due, Education has been provided regarding the importance of this vaccine. Advised may receive this vaccine at local pharmacy or Health Dept. Aware to provide a copy of the vaccination record if obtained from local pharmacy or Health Dept. Verbalized acceptance and understanding.  Pneumococcal vaccine status: Due, Education has been provided regarding the importance of this vaccine. Advised may receive this vaccine at local pharmacy or Health Dept. Aware to provide a copy of the vaccination record if obtained from local pharmacy or Health Dept. Verbalized acceptance and understanding.  Covid-19 vaccine status: Declined, Education has been provided regarding the importance of this vaccine but patient still declined. Advised may receive this vaccine at local pharmacy or Health Dept.or vaccine clinic. Aware to provide a copy of the vaccination record if obtained from local pharmacy or Health Dept. Verbalized acceptance and understanding.  Qualifies for Shingles Vaccine? Yes   Zostavax completed No   Shingrix Completed?: No.    Education has been provided regarding the importance of this vaccine. Patient has been advised to call insurance company to determine out of pocket expense if they have not yet received this vaccine. Advised may also receive vaccine at local pharmacy or Health Dept. Verbalized acceptance and understanding.  Screening Tests Health Maintenance  Topic Date Due   COVID-19 Vaccine (1) 07/15/2022 (Originally 03/03/1947)   Zoster Vaccines- Shingrix (1 of 2) 09/21/2022 (Originally 09/02/1994)    INFLUENZA VACCINE  12/16/2022 (Originally 04/17/2022)   Pneumonia Vaccine 78+ Years old (1 - PCV) 01/19/2023 (Originally 09/02/1952)   DEXA SCAN  Completed   Hepatitis C Screening  Completed   HPV VACCINES  Aged Out   TETANUS/TDAP  Discontinued    Health Maintenance  There are no preventive care reminders to display for this patient.  Colorectal cancer screening: No longer required.   Mammogram status: Completed 05/31/2021 scheduled for 08/03/2022. Repeat every year  Bone Density status: Completed 02/03/2015. Results reflect: Bone density results: NORMAL. Repeat every 5 years.  Lung Cancer Screening: (Low Dose CT Chest recommended if Age 63-80 years, 30 pack-year currently smoking OR have quit w/in 15years.) does not qualify.   Lung Cancer Screening Referral: N/A  Additional Screening:  Hepatitis C Screening: does not qualify; Completed 05/09/2022  Vision Screening: Recommended annual ophthalmology exams for early detection of glaucoma and other disorders of the eye. Is the patient up to date with their annual eye exam?  Yes  Who is the provider or what is the name of the office in which the patient attends annual eye exams? Dr. Gloriann Loan  If pt is not established with a provider, would they like to be referred to a provider to establish care? No .   Dental Screening: Recommended annual dental exams for proper oral hygiene  Community Resource Referral / Chronic Care Management: CRR required this visit?  No   CCM required this visit?  No      Plan:     I have personally reviewed and noted the following in the patient's chart:   Medical and social history Use of alcohol, tobacco or illicit drugs  Current medications and supplements including opioid prescriptions. Patient is not currently taking opioid prescriptions. Functional ability and status Nutritional status Physical activity Advanced directives List of other physicians Hospitalizations, surgeries, and ER visits in  previous 12 months Vitals Screenings to include cognitive, depression, and falls Referrals and appointments  In addition, I have reviewed and discussed with patient certain preventive protocols, quality metrics, and best practice recommendations. A written personalized care plan for preventive services as well as general preventive health recommendations were provided to patient.     Royal Hawthorn, [place]   07/05/2022   Nurse Notes:   Ms. Erker , Thank you for taking time to come for your Medicare Wellness Visit. I appreciate your ongoing commitment to your health goals. Please review the following plan we discussed and let me know if I can assist you in the future.   These are the goals we discussed:  Goals   None     This is a list of the screening recommended for you and due dates:  Health Maintenance  Topic Date Due   COVID-19 Vaccine (1) 07/15/2022*   Zoster (Shingles) Vaccine (1 of 2) 09/21/2022*   Flu Shot  12/16/2022*   Pneumonia Vaccine (1 - PCV) 01/19/2023*   DEXA scan (bone density measurement)  Completed   Hepatitis C Screening: USPSTF Recommendation to screen - Ages 55-79 yo.  Completed   HPV Vaccine  Aged Out   Tetanus Vaccine  Discontinued  *Topic was postponed. The date shown is not the original due date.

## 2022-07-26 ENCOUNTER — Encounter: Payer: Self-pay | Admitting: Internal Medicine

## 2022-07-26 ENCOUNTER — Ambulatory Visit (INDEPENDENT_AMBULATORY_CARE_PROVIDER_SITE_OTHER): Payer: Medicare Other | Admitting: Internal Medicine

## 2022-07-26 VITALS — BP 110/64 | HR 80 | Temp 98.1°F | Resp 16 | Ht 65.0 in | Wt 204.8 lb

## 2022-07-26 DIAGNOSIS — J432 Centrilobular emphysema: Secondary | ICD-10-CM | POA: Diagnosis not present

## 2022-07-26 DIAGNOSIS — K219 Gastro-esophageal reflux disease without esophagitis: Secondary | ICD-10-CM

## 2022-07-26 DIAGNOSIS — F419 Anxiety disorder, unspecified: Secondary | ICD-10-CM

## 2022-07-26 DIAGNOSIS — I2510 Atherosclerotic heart disease of native coronary artery without angina pectoris: Secondary | ICD-10-CM | POA: Diagnosis not present

## 2022-07-26 DIAGNOSIS — E782 Mixed hyperlipidemia: Secondary | ICD-10-CM | POA: Diagnosis not present

## 2022-07-26 DIAGNOSIS — I10 Essential (primary) hypertension: Secondary | ICD-10-CM

## 2022-07-26 MED ORDER — FLUTICASONE-SALMETEROL 250-50 MCG/ACT IN AEPB: 1.0000 | INHALATION_SPRAY | Freq: Two times a day (BID) | RESPIRATORY_TRACT | 0 refills | Status: DC

## 2022-07-26 MED ORDER — AMLODIPINE BESYLATE 5 MG PO TABS: 5.0000 mg | ORAL_TABLET | Freq: Every day | ORAL | 1 refills | Status: DC

## 2022-07-26 MED ORDER — SIMVASTATIN 40 MG PO TABS: 40.0000 mg | ORAL_TABLET | Freq: Every day | ORAL | 1 refills | Status: DC

## 2022-07-26 MED ORDER — METOPROLOL SUCCINATE ER 25 MG PO TB24: 25.0000 mg | ORAL_TABLET | Freq: Every day | ORAL | 1 refills | Status: DC

## 2022-07-26 MED ORDER — ESCITALOPRAM OXALATE 10 MG PO TABS: 10.0000 mg | ORAL_TABLET | Freq: Every day | ORAL | 1 refills | Status: DC

## 2022-07-26 MED ORDER — ALBUTEROL SULFATE HFA 108 (90 BASE) MCG/ACT IN AERS: 2.0000 | INHALATION_SPRAY | Freq: Four times a day (QID) | RESPIRATORY_TRACT | 10 refills | Status: DC | PRN

## 2022-07-26 MED ORDER — PANTOPRAZOLE SODIUM 40 MG PO TBEC: 40.0000 mg | DELAYED_RELEASE_TABLET | Freq: Every day | ORAL | 3 refills | Status: DC

## 2022-07-26 NOTE — Progress Notes (Signed)
Established Patient Office Visit  Subjective    Patient ID: Kellie Phelps, female    DOB: June 04, 1946  Age: 76 y.o. MRN: [id number]  CC:  Chief Complaint  Patient presents with   Follow-up   Hypertension   Hyperlipidemia   Depression    HPI Kellie Phelps presents for follow up and anxiety.  Hypertension/Diastolic HF: -Medications: Amlodipine 5 mg, Metoprolol 25 mg, Nitroglycerin PRN -Patient is compliant with above medications and reports no side effects. -Checking BP at home (average): 120-130/60-70 -Denies any SOB, CP, vision changes, LE edema or symptoms of hypotension -Follows with cardiology in [place], had a stress test on 10/13/21, EF 62% with small reversible defect of the anteroseptal wall in the apical and mid segments but otherwise normal.  -Last echo 05/12/2019 showing EF 60-65% with left ventricular diastolic impaired relaxation.   HLD/CAD: -Medications: Zocor 40 mg, aspirin 81 mg, fish oil  -Patient is compliant with above medications and reports no side effects.  -Last lipid panel: Lipid Panel     Component Value Date/Time   CHOL 143 01/18/2022 1350   CHOL 155 03/31/2015 1439   TRIG 128 01/18/2022 1350   HDL 47 (L) 01/18/2022 1350   HDL 45 03/31/2015 1439   CHOLHDL 3.0 01/18/2022 1350   VLDL 27.6 10/17/2012 0946   LDLCALC 75 01/18/2022 1350   LABVLDL 43 (H) 03/31/2015 1439    COPD/Asthma: -Following with [facility], last seen 10/27/20, note reviewed  -COPD status: controlled -Current medications: Wixela twice a day, Albuterol PRN. Needs refills  -Satisfied with current treatment?: yes -Oxygen use: no -Dyspnea frequency: with exertion occasionally -Cough frequency: going on for about 6 weeks - dry  -Rescue inhaler frequency:  twice a day  -Wheezing: No -Limitation of activity: no -Productive cough: no -Last Spirometry/PFTs: 2019 -Pneumovax: Not up to Date, politely declines -Influenza: Not up to Date -Was treated  for recent COPD exacerbation on 06/21/2022 with prednisone and Tessalon Perles.  GERD: -Currently on Prilosec 40 mg BID, helps somewhat has to take Tums as well  -Still having break through acid reflux symptoms, no nausea/vomiting. Appetite good, weight stable -Following with GI, note reviewed from 03/06/22.  Had EEG on 05/25/2022 which showed diffuse mildly erythematous mucosa without bleeding in the gastric fundus and gastric body.  Biopsy showed esophageal candidiasis and she was treated with oral fluconazole.  Glaucoma: -Currently on Dorzolamide 2% ophthalmic solution, following with Dr. Gloriann Loan  -Had both cataracts done this past May, doing well   Seasonal Allergies: -Currently using Flonase, doing well  Anxiety: -Duration:better -Currently on Lexapro 10 mg daily and is doing much better with this -She is compliant with medications and denies side effects. -Had been on Diazepam in the past, discussed how this medication would not be prescribed here. She was given Hydroxyzine 10 mg at her LOV for as needed anxiety, she took it once and felt very sleepy.      07/05/2022   11:03 AM 06/29/2022   11:16 AM 06/21/2022   11:12 AM 06/06/2022    9:06 AM 04/25/2022    9:56 AM  Depression screen PHQ 2/9  Decreased Interest 0 0 0 0 0  Down, Depressed, Hopeless 0 0 0 0 2  PHQ - 2 Score 0 0 0 0 2  Altered sleeping 0 0 0 0 0  Tired, decreased energy 0 0 0 1 0  Change in appetite 0 0 0 0 0  Feeling bad or failure about yourself  0 0 0 0  0  Trouble concentrating 0 0 0 0 0  Moving slowly or fidgety/restless 0 0 0 0 0  Suicidal thoughts 0 0 0 0 0  PHQ-9 Score 0 0 0 1 2  Difficult doing work/chores  Not difficult at all Not difficult at all Not difficult at all Not difficult at all   Elevated Liver Enzymes: -Labs from 01/18/22: AST 43, ALT 49 -Denies Tylenol or alcohol use  Pre-Diabetes: -A1c 01/18/22 5.8% -Not currently on medication   Health Maintenance: -Blood work UTD -Mammogram 9/22,  Birads-1, scheduled 11/17 -Colon cancer screening: upcoming end of the month    Outpatient Encounter Medications as of 07/26/2022  Medication Sig   albuterol (PROAIR HFA) 108 (90 Base) MCG/ACT inhaler Inhale 2 puffs into the lungs every 6 (six) hours as needed. Wheezing or shortness of breath.   amLODipine (NORVASC) 5 MG tablet Take by mouth.   aspirin 81 MG tablet Take 81 mg by mouth daily.   benzonatate (TESSALON) 100 MG capsule Take 1 capsule (100 mg total) by mouth 3 (three) times daily as needed for cough.   busPIRone (BUSPAR) 5 MG tablet Take 1 tablet (5 mg total) by mouth 2 (two) times daily. (Patient not taking: Reported on 06/29/2022)   Calcium Carbonate-Vitamin D 600-200 MG-UNIT TABS Take by mouth.   dicyclomine (BENTYL) 10 MG capsule Take 1 capsule (10 mg total) by mouth 4 (four) times daily -  before meals and at bedtime. (Patient not taking: Reported on 06/29/2022)   dorzolamide (TRUSOPT) 2 % ophthalmic solution    escitalopram (LEXAPRO) 10 MG tablet Take 1 tablet (10 mg total) by mouth daily.   ferrous sulfate 325 (65 FE) MG EC tablet Take by mouth.   fluconazole (DIFLUCAN) 150 MG tablet Take 1 tablet (150 mg total) by mouth every 3 (three) days as needed (for vaginal itching/yeast infection sx).   fluticasone-salmeterol (WIXELA INHUB) 250-50 MCG/ACT AEPB Inhale 1 puff into the lungs in the morning and at bedtime.   loratadine (CLARITIN) 10 MG tablet Take 1 tablet (10 mg total) by mouth daily.   metoprolol succinate (TOPROL-XL) 25 MG 24 hr tablet Take 1 tablet (25 mg total) by mouth daily.   mometasone (NASONEX) 50 MCG/ACT nasal spray Place 2 sprays into the nose daily.   montelukast (SINGULAIR) 10 MG tablet Take 1 tablet (10 mg total) by mouth at bedtime.   Multiple Vitamins-Minerals (ONE-A-DAY WOMENS 50 PLUS PO) Take by mouth daily.   nitroGLYCERIN (NITROSTAT) 0.4 MG SL tablet Place under the tongue.   Omega-3 Fatty Acids (FISH OIL) 1000 MG CAPS 1,000 mg. Take two tablets by  mouth daily.   pantoprazole (PROTONIX) 40 MG tablet Take 1 tablet (40 mg total) by mouth daily.   simvastatin (ZOCOR) 40 MG tablet Take 1 tablet (40 mg total) by mouth daily.   VYZULTA 0.024 % SOLN    No facility-administered encounter medications on file as of 07/26/2022.    Past Medical History:  Diagnosis Date   Allergy    Anxiety    Asthma    Atypical chest pain    Cataract    COPD (chronic obstructive pulmonary disease) (HCC)    Coronary artery disease, non-occlusive    a. LHC 6/08 EF 55-60%, 20-30% proximal LAD, LIs CFX, LIs RCA (Harwani); b. ETT-myoview 11/10, 3' stopped due to fatigue and chest tightness, EF normal, probably normal perfusion images with minimal soft tissue attenuation   Diastolic dysfunction    a. echo 2010: EF 60-65%, GR1DD, no AI, trivial  MR, LA nl   GERD (gastroesophageal reflux disease)    Glaucoma    Hyperglycemia    Hyperlipidemia    Hypertension     Past Surgical History:  Procedure Laterality Date   ESOPHAGOGASTRODUODENOSCOPY (EGD) WITH PROPOFOL N/A 05/25/2022   Procedure: ESOPHAGOGASTRODUODENOSCOPY (EGD) WITH PROPOFOL;  Surgeon: Lin Landsman, MD;  Location: [facility] ENDOSCOPY;  Service: Gastroenterology;  Laterality: N/A;  RIDE IS ABOUT 20 MINUTES OUT   TONSILLECTOMY      Family History  Problem Relation Age of Onset   Uterine cancer Mother    Heart attack Father    Pulmonary embolism Brother    Hepatitis Brother    Coronary artery disease Brother    Coronary artery disease Other        Aunt    Social History   Socioeconomic History   Marital status: Single    Spouse name: Not on file   Number of children: Not on file   Years of education: Not on file   Highest education level: Not on file  Occupational History   Occupation: Retired    Fish farm manager: RETIRED  Tobacco Use   Smoking status: Former    Packs/day: 0.50    Years: 5.00    Total pack years: 2.50    Types: Cigarettes    Quit date: 09/17/1978    Years since quitting:  43.8   Smokeless tobacco: Never   Tobacco comments:    unsure how long she smoked for--10/27/2020  Vaping Use   Vaping Use: Never used  Substance and Sexual Activity   Alcohol use: No   Drug use: No   Sexual activity: Not Currently    Partners: Male    Birth control/protection: Post-menopausal  Other Topics Concern   Not on file  Social History Narrative   Divorced   Lives alone   No children   Does not get regular exercise   Social Determinants of Health   Financial Resource Strain: Medium Risk (07/05/2022)   Overall Financial Resource Strain (CARDIA)    Difficulty of Paying Living Expenses: Somewhat hard  Food Insecurity: Unknown (07/05/2022)   Hunger Vital Sign    Worried About Running Out of Food in the Last Year: Never true    Ran Out of Food in the Last Year: Not on file  Transportation Needs: No Transportation Needs (07/05/2022)   PRAPARE - Hydrologist (Medical): No    Lack of Transportation (Non-Medical): No  Physical Activity: Inactive (07/05/2022)   Exercise Vital Sign    Days of Exercise per Week: 0 days    Minutes of Exercise per Session: 0 min  Stress: No Stress Concern Present (07/05/2022)   [place]    Feeling of Stress : Not at all  Social Connections: Moderately Integrated (07/05/2022)   Social Connection and Isolation Panel [NHANES]    Frequency of Communication with Friends and Family: Once a week    Frequency of Social Gatherings with Friends and Family: Twice a week    Attends Religious Services: More than 4 times per year    Active Member of [employer] or Organizations: No    Attends Music therapist: More than 4 times per year    Marital Status: Divorced  Intimate Partner Violence: Not At Risk (07/05/2022)   Humiliation, Afraid, Rape, and Kick questionnaire    Fear of Current or Ex-Partner: No    Emotionally Abused: No    Physically  Abused:  No    Sexually Abused: No    Review of Systems  Constitutional:  Negative for chills and fever.  Respiratory:  Positive for cough. Negative for sputum production, shortness of breath and wheezing.   Cardiovascular:  Negative for chest pain.  Neurological:  Negative for dizziness and headaches.      Objective    BP 110/64   Pulse 80   Temp 98.1 F (36.7 C)   Resp 16   Ht '5\' 5"'$  (1.651 m)   Wt 204 lb 12.8 oz (92.9 kg)   SpO2 96%   BMI 34.08 kg/m   Physical Exam Constitutional:      Appearance: Normal appearance.  HENT:     Head: Normocephalic and atraumatic.  Eyes:     Conjunctiva/sclera: Conjunctivae normal.  Cardiovascular:     Rate and Rhythm: Normal rate and regular rhythm.  Pulmonary:     Effort: Pulmonary effort is normal.     Breath sounds: Normal breath sounds.  Musculoskeletal:     Right lower leg: No edema.     Left lower leg: No edema.  Skin:    General: Skin is warm and dry.  Neurological:     General: No focal deficit present.     Mental Status: She is alert. Mental status is at baseline.  Psychiatric:        Mood and Affect: Mood normal.        Behavior: Behavior normal.       Assessment & Plan:   1. Centrilobular emphysema ([place]): Still has some cough, much improved after her COPD exacerbation few weeks ago.  Continue Wixela daily and albuterol as needed, both refilled today.  - albuterol (PROAIR HFA) 108 (90 Base) MCG/ACT inhaler; Inhale 2 puffs into the lungs every 6 (six) hours as needed. Wheezing or shortness of breath.  Dispense: 18 g; Refill: 10 - fluticasone-salmeterol (WIXELA INHUB) 250-50 MCG/ACT AEPB; Inhale 1 puff into the lungs in the morning and at bedtime.  Dispense: 180 each; Refill: 0  2. Essential hypertension: Chronic and stable.  Continue amlodipine 5 mg, metoprolol 25 mg daily, refilled today.  - amLODipine (NORVASC) 5 MG tablet; Take 1 tablet (5 mg total) by mouth daily.  Dispense: 90 tablet; Refill: 1 - metoprolol  succinate (TOPROL-XL) 25 MG 24 hr tablet; Take 1 tablet (25 mg total) by mouth daily.  Dispense: 90 tablet; Refill: 1  3. Coronary artery disease, non-occlusive/Mixed hyperlipidemia: Stable.  Plan to check fasting labs at follow-up.  Continue Zocor 40 mg, refilled.  - simvastatin (ZOCOR) 40 MG tablet; Take 1 tablet (40 mg total) by mouth daily.  Dispense: 90 tablet; Refill: 1  4. Gastroesophageal reflux disease, unspecified whether esophagitis present: Stable, better since she finished her oral fluconazole.  Continue pantoprazole 40 mg, refilled.  - pantoprazole (PROTONIX) 40 MG tablet; Take 1 tablet (40 mg total) by mouth daily.  Dispense: 30 tablet; Refill: 3  5. Anxiety: Much improved.  Continue Lexapro 10 mg, refilled.  - escitalopram (LEXAPRO) 10 MG tablet; Take 1 tablet (10 mg total) by mouth daily.  Dispense: 90 tablet; Refill: 1  Return in about 6 months (around 01/24/2023).   Teodora Medici, DO

## 2022-07-26 NOTE — Patient Instructions (Addendum)
It was great seeing you today!  Plan discussed at today's visit: -Medications refilled   Follow up in: 6 months   Take care and let us know if you have any questions or concerns prior to your next visit.  Dr. Rosana Berger

## 2022-08-03 ENCOUNTER — Ambulatory Visit
Admission: RE | Admit: 2022-08-03 | Discharge: 2022-08-03 | Disposition: A | Discharge status: Home / Self Care | Payer: Medicare Other | Source: Ambulatory Visit | Attending: Internal Medicine | Admitting: Internal Medicine

## 2022-08-03 DIAGNOSIS — Z1231 Encounter for screening mammogram for malignant neoplasm of breast: Secondary | ICD-10-CM | POA: Insufficient documentation

## 2022-08-21 NOTE — Progress Notes (Deleted)
Psychiatric Initial Adult Assessment   Patient Identification: Kellie Phelps MRN:  [id number] Date of Evaluation:  08/21/2022 Referral Source: *** Chief Complaint:  No chief complaint on file.  Visit Diagnosis: No diagnosis found.  History of Present Illness:   Kellie Phelps is a 76 y.o. year old female with a history of COPD, CAD, diastolic dysfunction. hypertension, hyperlipidemia, GERD,, who is referred for anxiety.     Lexapro 10 mg         Was on valium  Associated Signs/Symptoms: Depression Symptoms:  {DEPRESSION SYMPTOMS:20000} (Hypo) Manic Symptoms:  {BHH MANIC SYMPTOMS:22872} Anxiety Symptoms:  {BHH ANXIETY SYMPTOMS:22873} Psychotic Symptoms:  {BHH PSYCHOTIC SYMPTOMS:22874} PTSD Symptoms: {BHH PTSD SYMPTOMS:22875}  Past Psychiatric History:  Outpatient:  Psychiatry admission:  Previous suicide attempt:  Past trials of medication:  History of violence:  History of head injury:   Previous Psychotropic Medications: {YES/NO:21197}  Substance Abuse History in the last 12 months:  {yes no:314532}  Consequences of Substance Abuse: {BHH CONSEQUENCES OF SUBSTANCE ABUSE:22880}  Past Medical History:  Past Medical History:  Diagnosis Date   Allergy    Anxiety    Asthma    Atypical chest pain    Cataract    COPD (chronic obstructive pulmonary disease) (HCC)    Coronary artery disease, non-occlusive    a. LHC 6/08 EF 55-60%, 20-30% proximal LAD, LIs CFX, LIs RCA (Harwani); b. ETT-myoview 11/10, 3' stopped due to fatigue and chest tightness, EF normal, probably normal perfusion images with minimal soft tissue attenuation   Diastolic dysfunction    a. echo 2010: EF 60-65%, GR1DD, no AI, trivial MR, LA nl   GERD (gastroesophageal reflux disease)    Glaucoma    Hyperglycemia    Hyperlipidemia    Hypertension     Past Surgical History:  Procedure Laterality Date   ESOPHAGOGASTRODUODENOSCOPY (EGD) WITH PROPOFOL N/A 05/25/2022   Procedure:  ESOPHAGOGASTRODUODENOSCOPY (EGD) WITH PROPOFOL;  Surgeon: Lin Landsman, MD;  Location: [facility] ENDOSCOPY;  Service: Gastroenterology;  Laterality: N/A;  RIDE IS ABOUT 20 MINUTES OUT   TONSILLECTOMY      Family Psychiatric History: ***  Family History:  Family History  Problem Relation Age of Onset   Uterine cancer Mother    Heart attack Father    Pulmonary embolism Brother    Hepatitis Brother    Coronary artery disease Brother    Coronary artery disease Other        Aunt    Social History:   Social History   Socioeconomic History   Marital status: Single    Spouse name: Not on file   Number of children: Not on file   Years of education: Not on file   Highest education level: Not on file  Occupational History   Occupation: Retired    Fish farm manager: RETIRED  Tobacco Use   Smoking status: Former    Packs/day: 0.50    Years: 5.00    Total pack years: 2.50    Types: Cigarettes    Quit date: 09/17/1978    Years since quitting: 43.9   Smokeless tobacco: Never   Tobacco comments:    unsure how long she smoked for--10/27/2020  Vaping Use   Vaping Use: Never used  Substance and Sexual Activity   Alcohol use: No   Drug use: No   Sexual activity: Not Currently    Partners: Male    Birth control/protection: Post-menopausal  Other Topics Concern   Not on file  Social History Narrative   Divorced  Lives alone   No children   Does not get regular exercise   Social Determinants of Health   Financial Resource Strain: Medium Risk (07/05/2022)   Overall Financial Resource Strain (CARDIA)    Difficulty of Paying Living Expenses: Somewhat hard  Food Insecurity: Unknown (07/05/2022)   Hunger Vital Sign    Worried About Running Out of Food in the Last Year: Never true    Ran Out of Food in the Last Year: Not on file  Transportation Needs: No Transportation Needs (07/05/2022)   PRAPARE - Hydrologist (Medical): No    Lack of Transportation  (Non-Medical): No  Physical Activity: Inactive (07/05/2022)   Exercise Vital Sign    Days of Exercise per Week: 0 days    Minutes of Exercise per Session: 0 min  Stress: No Stress Concern Present (07/05/2022)   [place]    Feeling of Stress : Not at all  Social Connections: Moderately Integrated (07/05/2022)   Social Connection and Isolation Panel [NHANES]    Frequency of Communication with Friends and Family: Once a week    Frequency of Social Gatherings with Friends and Family: Twice a week    Attends Religious Services: More than 4 times per year    Active Member of [employer] or Organizations: No    Attends Music therapist: More than 4 times per year    Marital Status: Divorced    Additional Social History: ***  Allergies:   Allergies  Allergen Reactions   Penicillins Hives    Metabolic Disorder Labs: Lab Results  Component Value Date   HGBA1C 5.8 (H) 01/18/2022   MPG 120 01/18/2022   No results found for: "PROLACTIN" Lab Results  Component Value Date   CHOL 143 01/18/2022   TRIG 128 01/18/2022   HDL 47 (L) 01/18/2022   CHOLHDL 3.0 01/18/2022   VLDL 27.6 10/17/2012   [place] 75 01/18/2022   LDLCALC 67 03/31/2015   No results found for: "TSH"  Therapeutic Level Labs: No results found for: "LITHIUM" No results found for: "CBMZ" No results found for: "VALPROATE"  Current Medications: Current Outpatient Medications  Medication Sig Dispense Refill   albuterol (PROAIR HFA) 108 (90 Base) MCG/ACT inhaler Inhale 2 puffs into the lungs every 6 (six) hours as needed. Wheezing or shortness of breath. 18 g 10   amLODipine (NORVASC) 5 MG tablet Take 1 tablet (5 mg total) by mouth daily. 90 tablet 1   aspirin 81 MG tablet Take 81 mg by mouth daily.     Calcium Carbonate-Vitamin D 600-200 MG-UNIT TABS Take by mouth.     dicyclomine (BENTYL) 10 MG capsule Take 1 capsule (10 mg total) by mouth 4  (four) times daily -  before meals and at bedtime. (Patient not taking: Reported on 06/29/2022) 60 capsule 0   dorzolamide (TRUSOPT) 2 % ophthalmic solution      escitalopram (LEXAPRO) 10 MG tablet Take 1 tablet (10 mg total) by mouth daily. 90 tablet 1   fluticasone-salmeterol (WIXELA INHUB) 250-50 MCG/ACT AEPB Inhale 1 puff into the lungs in the morning and at bedtime. 180 each 0   loratadine (CLARITIN) 10 MG tablet Take 1 tablet (10 mg total) by mouth daily. 30 tablet 11   metoprolol succinate (TOPROL-XL) 25 MG 24 hr tablet Take 1 tablet (25 mg total) by mouth daily. 90 tablet 1   Multiple Vitamins-Minerals (ONE-A-DAY WOMENS 50 PLUS PO) Take by mouth daily.  nitroGLYCERIN (NITROSTAT) 0.4 MG SL tablet Place under the tongue.     Omega-3 Fatty Acids (FISH OIL) 1000 MG CAPS 1,000 mg. Take two tablets by mouth daily.     pantoprazole (PROTONIX) 40 MG tablet Take 1 tablet (40 mg total) by mouth daily. 30 tablet 3   simvastatin (ZOCOR) 40 MG tablet Take 1 tablet (40 mg total) by mouth daily. 90 tablet 1   VYZULTA 0.024 % SOLN      No current facility-administered medications for this visit.    Musculoskeletal: Strength & Muscle Tone: within normal limits Gait & Station: normal Patient leans: N/A  Psychiatric Specialty Exam: Review of Systems  There were no vitals taken for this visit.There is no height or weight on file to calculate BMI.  General Appearance: {Appearance:22683}  Eye Contact:  {BHH EYE CONTACT:22684}  Speech:  Clear and Coherent  Volume:  Normal  Mood:  {BHH MOOD:22306}  Affect:  {Affect (PAA):22687}  Thought Process:  Coherent  Orientation:  Full (Time, Place, and Person)  Thought Content:  Logical  Suicidal Thoughts:  {ST/HT (PAA):22692}  Homicidal Thoughts:  {ST/HT (PAA):22692}  Memory:  Immediate;   Good  Judgement:  {Judgement (PAA):22694}  Insight:  {Insight (PAA):22695}  Psychomotor Activity:  Normal  Concentration:  Concentration: Good and Attention Span:  Good  Recall:  Good  Fund of Knowledge:Good  Language: Good  Akathisia:  No  Handed:  Right  AIMS (if indicated):  not done  Assets:  Communication Skills Desire for Improvement  ADL's:  Intact  Cognition: WNL  Sleep:  {BHH GOOD/FAIR/POOR:22877}   Screenings: GAD-7    Flowsheet Row Office Visit from 06/06/2022 in [facility]  Total GAD-7 Score 2      PHQ2-9    [place] from 07/05/2022 in [facility] Office Visit from 06/29/2022 in [facility] Office Visit from 06[facility]21/2022 in [facility] Office Visit from 06/06/2022 in [facility] Office Visit from 04/25/2022 in [facility]  PHQ-2 Total Score 0 0 0 0 2  PHQ-9 Total Score 0 0 0 1 2      Flowsheet Row Admission (Discharged) from 05/25/2022 in [place] [facility] from 05/18/2022 in [place] ED from 03/12/2022 in [place] No Risk No Risk No Risk       Assessment and Plan:  Assessment  Plan   The patient demonstrates the following risk factors for suicide: Chronic risk factors for suicide include: {Chronic Risk Factors for ZSWFUXN:23557322}. Acute risk factors for suicide include: {Acute Risk Factors for GURKYHC:62376283}. Protective factors for this patient include: {Protective Factors for Suicide TDVV:61607371}. Considering these factors, the overall suicide risk at this point appears to be {Desc; low/moderate/high:110033}. Patient {ACTION; IS/IS GGY:69485462} appropriate for outpatient follow up.  Collaboration of Care: {BH OP Collaboration of Care:21014065}  Patient/Guardian was advised Release of Information must be obtained prior to any record release in order to collaborate their care with an outside provider. Patient/Guardian was advised if they have not  already done so to contact the registration department to sign all necessary forms in order for [place] to release information regarding their care.   Consent: Patient/Guardian gives verbal consent for treatment and assignment of benefits for services provided during this visit. Patient/Guardian expressed understanding and agreed to proceed.   Norman Clay, MD 12/5/202311:30 AM

## 2022-08-23 ENCOUNTER — Ambulatory Visit: Payer: Medicare Other | Admitting: Psychiatry

## 2022-08-31 ENCOUNTER — Ambulatory Visit: Payer: Self-pay | Admitting: *Deleted

## 2022-08-31 NOTE — Telephone Encounter (Signed)
Lvm for pt to call and schedule an appt in order to get antibiotics

## 2022-08-31 NOTE — Telephone Encounter (Signed)
Summary: Pt request Rx for an antibiotic   Pt request Rx for an antibiotic for possible sinus infection. Cb# [phone]         Reason for Disposition  [1] Redness or swelling on the cheek, forehead or around the eye AND [2] no fever    Sinus pressure- forehead and sinus, R ear burning  Answer Assessment - Initial Assessment Questions 1. LOCATION: "Where does it hurt?"      Head hurting and forehead tender 2. ONSET: "When did the sinus pain start?"  (e.g., hours, days)      Ongoing since Monday 3. SEVERITY: "How bad is the pain?"   (Scale 1-10; mild, moderate or severe)   - MILD (1-3): doesn't interfere with normal activities    - MODERATE (4-7): interferes with normal activities (e.g., work or school) or awakens from sleep   - SEVERE (8-10): excruciating pain and patient unable to do any normal activities        Pain- mild- using Tylenol 4. RECURRENT SYMPTOM: "Have you ever had sinus problems before?" If Yes, ask: "When was the last time?" and "What happened that time?"      Yes- last month- antibiotic, Claritin, cough medication  5. NASAL CONGESTION: "Is the nose blocked?" If Yes, ask: "Can you open it or must you breathe through your mouth?"     running 6. NASAL DISCHARGE: "Do you have discharge from your nose?" If so ask, "What color?"     Clear/white 7. FEVER: "Do you have a fever?" If Yes, ask: "What is it, how was it measured, and when did it start?"      No fever 8. OTHER SYMPTOMS: "Do you have any other symptoms?" (e.g., sore throat, cough, earache, difficulty breathing)     R ear burning  Protocols used: Sinus Pain or Congestion-A-AH

## 2022-08-31 NOTE — Telephone Encounter (Signed)
  Chief Complaint: medication request- patient insistent she has sinus infection and needs antibiotic- she was seen and treated 1 month ago Symptoms: pressure in forehead, nasal drainage, ear burning Frequency: started Monday Pertinent Negatives: Patient denies fever, difficulty breathing Disposition: '[]'$ ED /'[]'$ Urgent Care (no appt availability in office) / '[]'$ Appointment(In office/virtual)/ '[]'$  [facility]/ '[]'$ Home Care/ '[x]'$ Refused Recommended Disposition /'[]'$ [facility]/ '[]'$  Follow-up with PCP Additional Notes: Patient requesting retreat with antibiotic- advised office is closed for lunch- request will be sent

## 2022-09-05 NOTE — Progress Notes (Unsigned)
   Acute Office Visit  Subjective:     Patient ID: Kellie Phelps, female    DOB: 1945-10-23, 76 y.o.   MRN: [id number]  No chief complaint on file.   HPI Patient is in today for sinus pain.  URI Compliant:   -Worst symptom: -Fever: {Blank single:19197::"yes","no"} -Cough: {Blank single:19197::"yes","no"} -Shortness of breath: {Blank single:19197::"yes","no"} -Wheezing: {Blank single:19197::"yes","no"} -Chest pain: {Blank single:19197::"yes","no","yes, with cough"} -Chest tightness: {Blank single:19197::"yes","no"} -Chest congestion: {Blank single:19197::"yes","no"} -Nasal congestion: {Blank single:19197::"yes","no"} -Runny nose: {Blank single:19197::"yes","no"} -Post nasal drip: {Blank single:19197::"yes","no"} -Sneezing: {Blank single:19197::"yes","no"} -Sore throat: {Blank single:19197::"yes","no"} -Swollen glands: {Blank single:19197::"yes","no"} -Sinus pressure: {Blank single:19197::"yes","no"} -Headache: {Blank single:19197::"yes","no"} -Face pain: {Blank single:19197::"yes","no"} -Toothache: {Blank single:19197::"yes","no"} -Ear pain: {Blank single:19197::"yes","no"} {Blank single:19197::""right","left", "bilateral"} -Ear pressure: {Blank single:19197::"yes","no"} {Blank single:19197::""right","left", "bilateral"} -Eyes red/itching:{Blank single:19197::"yes","no"} -Eye drainage/crusting: {Blank single:19197::"yes","no"}  -Vomiting: {Blank single:19197::"yes","no"} -Rash: {Blank single:19197::"yes","no"} -Fatigue: {Blank single:19197::"yes","no"} -Sick contacts: {Blank single:19197::"yes","no"} -Strep contacts: {Blank single:19197::"yes","no"}  -Context: {Blank multiple:19196::"better","worse","stable","fluctuating"} -Recurrent sinusitis: {Blank single:19197::"yes","no"} -Relief with OTC cold/cough medications: {Blank single:19197::"yes","no"}  -Treatments attempted: {Blank  multiple:19196::"none","cold/sinus","mucinex","anti-histamine","pseudoephedrine","cough syrup","antibiotics"}    ROS      Objective:    There were no vitals taken for this visit. {Vitals History (Optional):23777}  Physical Exam  No results found for any visits on 09/06/22.      Assessment & Plan:   Problem List Items Addressed This Visit   None   No orders of the defined types were placed in this encounter.   No follow-ups on file.  Teodora Medici, DO

## 2022-09-06 ENCOUNTER — Ambulatory Visit (INDEPENDENT_AMBULATORY_CARE_PROVIDER_SITE_OTHER): Payer: Medicare Other | Admitting: Internal Medicine

## 2022-09-06 ENCOUNTER — Encounter: Payer: Self-pay | Admitting: Internal Medicine

## 2022-09-06 VITALS — BP 120/70 | HR 68 | Temp 98.4°F | Resp 18 | Ht 65.0 in | Wt 206.8 lb

## 2022-09-06 DIAGNOSIS — J0110 Acute frontal sinusitis, unspecified: Secondary | ICD-10-CM

## 2022-09-06 MED ORDER — DOXYCYCLINE HYCLATE 100 MG PO TABS: 100.0000 mg | ORAL_TABLET | Freq: Two times a day (BID) | ORAL | 0 refills | Status: AC

## 2022-09-06 MED ORDER — METHYLPREDNISOLONE 4 MG PO TBPK: ORAL_TABLET | ORAL | 0 refills | Status: DC

## 2022-09-06 NOTE — Patient Instructions (Signed)
It was great seeing you today!  Plan discussed at today's visit: -Steroid pack and antibiotics sent to pharmacy - only take antibiotics if symptoms worsening over the holiday -Recommend Corcidin decongestant for high blood pressure  Follow up in: as needed   Take care and let us know if you have any questions or concerns prior to your next visit.  Dr. Rosana Berger

## 2022-10-15 ENCOUNTER — Other Ambulatory Visit: Payer: Self-pay | Admitting: Internal Medicine

## 2022-10-15 ENCOUNTER — Telehealth: Payer: Self-pay | Admitting: Internal Medicine

## 2022-10-15 DIAGNOSIS — D229 Melanocytic nevi, unspecified: Secondary | ICD-10-CM

## 2022-10-15 NOTE — Telephone Encounter (Signed)
Copied from [place] [id number] [id number]. Topic: Referral - Request for Referral >> Oct 15, 2022  9:51 AM Chapman Fitch wrote: Has patient seen PCP for this complaint? No  *If NO, is insurance requiring patient see PCP for this issue before PCP can refer them? Referral for which specialty: dermatology  Preferred provider/office: [facility] phone [phone] Reason for referral: moles on back

## 2022-10-16 ENCOUNTER — Other Ambulatory Visit: Payer: Self-pay | Admitting: Internal Medicine

## 2022-10-16 DIAGNOSIS — J432 Centrilobular emphysema: Secondary | ICD-10-CM

## 2022-10-16 NOTE — Telephone Encounter (Signed)
Requested Prescriptions  Pending Prescriptions Disp Refills   fluticasone-salmeterol (ADVAIR) 250-50 MCG/ACT AEPB [Pharmacy Med Name: FLUTICASONE-SALMETEROL 250-50 MCG/A] 180 each 0    Sig: INHALE 1 PUFF INTO THE LUNGS IN THE MORNING AND AT BEDTIME     Pulmonology:  Combination Products Passed - 10/16/2022 10:43 AM      Passed - Valid encounter within last 12 months    Recent Outpatient Visits           1 month ago Acute non-recurrent frontal sinusitis   [facility] Teodora Medici, DO   2 months ago Centrilobular emphysema [facility])   [facility] Teodora Medici, DO   3 months ago Otalgia of both [facility] Delsa Grana, PA-C   3 months ago [facility] Delsa Grana, PA-C   4 months ago [facility] Teodora Medici, DO       Future Appointments             In 1 month Martyn Ehrich, NP [facility] [place]   In 3 months Teodora Medici, [facility], [facility]

## 2022-10-22 DIAGNOSIS — M545 Low back pain, unspecified: Secondary | ICD-10-CM | POA: Diagnosis not present

## 2022-10-22 DIAGNOSIS — M26643 Arthritis of bilateral temporomandibular joint: Secondary | ICD-10-CM | POA: Diagnosis not present

## 2022-10-22 DIAGNOSIS — J3489 Other specified disorders of nose and nasal sinuses: Secondary | ICD-10-CM | POA: Diagnosis not present

## 2022-10-22 DIAGNOSIS — M5137 Other intervertebral disc degeneration, lumbosacral region: Secondary | ICD-10-CM | POA: Diagnosis not present

## 2022-10-22 DIAGNOSIS — M47816 Spondylosis without myelopathy or radiculopathy, lumbar region: Secondary | ICD-10-CM | POA: Diagnosis not present

## 2022-10-22 DIAGNOSIS — M47818 Spondylosis without myelopathy or radiculopathy, sacral and sacrococcygeal region: Secondary | ICD-10-CM | POA: Diagnosis not present

## 2022-10-22 DIAGNOSIS — G8929 Other chronic pain: Secondary | ICD-10-CM | POA: Diagnosis not present

## 2022-10-22 DIAGNOSIS — H9201 Otalgia, right ear: Secondary | ICD-10-CM | POA: Diagnosis not present

## 2022-10-22 DIAGNOSIS — K219 Gastro-esophageal reflux disease without esophagitis: Secondary | ICD-10-CM | POA: Diagnosis not present

## 2022-10-23 DIAGNOSIS — L853 Xerosis cutis: Secondary | ICD-10-CM | POA: Diagnosis not present

## 2022-10-23 DIAGNOSIS — L821 Other seborrheic keratosis: Secondary | ICD-10-CM | POA: Diagnosis not present

## 2022-10-23 DIAGNOSIS — L298 Other pruritus: Secondary | ICD-10-CM | POA: Diagnosis not present

## 2022-10-24 ENCOUNTER — Telehealth: Payer: Self-pay | Admitting: Gastroenterology

## 2022-10-24 NOTE — Telephone Encounter (Signed)
Patient is having epigastric pain and burning all the way down her throat  patient is still taking the the pantoprazole '40mg'$ . Made appointment 11/15/22 at 1:00

## 2022-10-24 NOTE — Telephone Encounter (Signed)
Patient called stating she is having some reflux and burning in her throat. She is wanting to be seen.

## 2022-10-29 DIAGNOSIS — H40153 Residual stage of open-angle glaucoma, bilateral: Secondary | ICD-10-CM | POA: Diagnosis not present

## 2022-10-29 NOTE — Progress Notes (Unsigned)
Established Patient Office Visit  Subjective    Patient ID: Kellie Phelps, female    DOB: 1946-05-14  Age: 77 y.o. MRN: [id number]  CC:  No chief complaint on file.   HPI Kellie Phelps presents for follow up and anxiety.  Hypertension/Diastolic HF: -Medications: Amlodipine 5 mg, Metoprolol 25 mg, Nitroglycerin PRN -Patient is compliant with above medications and reports no side effects. -Checking BP at home (average): 120-130/60-70 -Denies any SOB, CP, vision changes, LE edema or symptoms of hypotension -Follows with cardiology in [place], had a stress test on 10/13/21, EF 62% with small reversible defect of the anteroseptal wall in the apical and mid segments but otherwise normal.  -Last echo 05/12/2019 showing EF 60-65% with left ventricular diastolic impaired relaxation.   HLD/CAD: -Medications: Zocor 40 mg, aspirin 81 mg, fish oil  -Patient is compliant with above medications and reports no side effects.  -Last lipid panel: Lipid Panel     Component Value Date/Time   CHOL 143 01/18/2022 1350   CHOL 155 03/31/2015 1439   TRIG 128 01/18/2022 1350   HDL 47 (L) 01/18/2022 1350   HDL 45 03/31/2015 1439   CHOLHDL 3.0 01/18/2022 1350   VLDL 27.6 10/17/2012 0946   LDLCALC 75 01/18/2022 1350   LABVLDL 43 (H) 03/31/2015 1439    COPD/Asthma: -Following with [facility], last seen 10/27/20, note reviewed  -COPD status: controlled -Current medications: Wixela twice a day, Albuterol PRN. Needs refills  -Satisfied with current treatment?: yes -Oxygen use: no -Dyspnea frequency: with exertion occasionally -Cough frequency: going on for about 6 weeks - dry  -Rescue inhaler frequency:  twice a day  -Wheezing: No -Limitation of activity: no -Productive cough: no -Last Spirometry/PFTs: 2019 -Pneumovax: Not up to Date, politely declines -Influenza: Not up to Date -Was treated for recent COPD exacerbation on 06/21/2022 with prednisone and Tessalon  Perles.  GERD: -Currently on Prilosec 40 mg BID, helps somewhat has to take Tums as well  -Still having break through acid reflux symptoms, no nausea/vomiting. Appetite good, weight stable -Following with GI, note reviewed from 03/06/22.  Had EEG on 05/25/2022 which showed diffuse mildly erythematous mucosa without bleeding in the gastric fundus and gastric body.  Biopsy showed esophageal candidiasis and she was treated with oral fluconazole.  Glaucoma: -Currently on Dorzolamide 2% ophthalmic solution, following with Dr. Gloriann Loan  -Had both cataracts done this past May, doing well   Seasonal Allergies: -Currently using Flonase, doing well  Anxiety: -Duration:better -Currently on Lexapro 10 mg daily and is doing much better with this -She is compliant with medications and denies side effects. -Had been on Diazepam in the past, discussed how this medication would not be prescribed here. She was given Hydroxyzine 10 mg at her LOV for as needed anxiety, she took it once and felt very sleepy.      09/06/2022    8:24 AM 07/05/2022   11:03 AM 06/29/2022   11:16 AM 06/21/2022   11:12 AM 06/06/2022    9:06 AM  Depression screen PHQ 2/9  Decreased Interest 0 0 0 0 0  Down, Depressed, Hopeless 0 0 0 0 0  PHQ - 2 Score 0 0 0 0 0  Altered sleeping 0 0 0 0 0  Tired, decreased energy 0 0 0 0 1  Change in appetite 0 0 0 0 0  Feeling bad or failure about yourself  0 0 0 0 0  Trouble concentrating 0 0 0 0 0  Moving slowly or fidgety/restless  0 0 0 0 0  Suicidal thoughts 0 0 0 0 0  PHQ-9 Score 0 0 0 0 1  Difficult doing work/chores Not difficult at all  Not difficult at all Not difficult at all Not difficult at all   Elevated Liver Enzymes: -Labs from 01/18/22: AST 43, ALT 49 -Denies Tylenol or alcohol use  Pre-Diabetes: -A1c 01/18/22 5.8% -Not currently on medication   Health Maintenance: -Blood work UTD -Mammogram 11/23, Birads-1 -Colon cancer screening: upcoming end of the month     Outpatient Encounter Medications as of 10/30/2022  Medication Sig   albuterol (PROAIR HFA) 108 (90 Base) MCG/ACT inhaler Inhale 2 puffs into the lungs every 6 (six) hours as needed. Wheezing or shortness of breath.   amLODipine (NORVASC) 5 MG tablet Take 1 tablet (5 mg total) by mouth daily.   aspirin 81 MG tablet Take 81 mg by mouth daily.   Calcium Carbonate-Vitamin D 600-200 MG-UNIT TABS Take by mouth.   dicyclomine (BENTYL) 10 MG capsule Take 1 capsule (10 mg total) by mouth 4 (four) times daily -  before meals and at bedtime. (Patient not taking: Reported on 06/29/2022)   dorzolamide (TRUSOPT) 2 % ophthalmic solution    escitalopram (LEXAPRO) 10 MG tablet Take 1 tablet (10 mg total) by mouth daily.   fluticasone-salmeterol (ADVAIR) 250-50 MCG/ACT AEPB INHALE 1 PUFF INTO THE LUNGS IN THE MORNING AND AT BEDTIME   loratadine (CLARITIN) 10 MG tablet Take 1 tablet (10 mg total) by mouth daily.   methylPREDNISolone (MEDROL DOSEPAK) 4 MG TBPK tablet Day 1: Take 8 mg (2 tablets) before breakfast, 4 mg (1 tablet) after lunch, 4 mg (1 tablet) after supper, and 8 mg (2 tablets) at bedtime. Day 2:Take 4 mg (1 tablet) before breakfast, 4 mg (1 tablet) after lunch, 4 mg (1 tablet) after supper, and 8 mg (2 tablets) at bedtime. Day 3: Take 4 mg (1 tablet) before breakfast, 4 mg (1 tablet) after lunch, 4 mg (1 tablet) after supper, and 4 mg (1 tablet) at bedtime. Day 4: Take 4 mg (1 tablet) before breakfast, 4 mg (1 tablet) after lunch, and 4 mg (1 tablet) at bedtime. Day 5: Take 4 mg (1 tablet) before breakfast and 4 mg (1 tablet) at bedtime. Day 6: Take 4 mg (1 tablet) before breakfast.   metoprolol succinate (TOPROL-XL) 25 MG 24 hr tablet Take 1 tablet (25 mg total) by mouth daily.   Multiple Vitamins-Minerals (ONE-A-DAY WOMENS 50 PLUS PO) Take by mouth daily.   nitroGLYCERIN (NITROSTAT) 0.4 MG SL tablet Place under the tongue.   Omega-3 Fatty Acids (FISH OIL) 1000 MG CAPS 1,000 mg. Take two tablets by  mouth daily.   pantoprazole (PROTONIX) 40 MG tablet Take 1 tablet (40 mg total) by mouth daily.   simvastatin (ZOCOR) 40 MG tablet Take 1 tablet (40 mg total) by mouth daily.   VYZULTA 0.024 % SOLN    No facility-administered encounter medications on file as of 10/30/2022.    Past Medical History:  Diagnosis Date   Allergy    Anxiety    Asthma    Atypical chest pain    Cataract    COPD (chronic obstructive pulmonary disease) (HCC)    Coronary artery disease, non-occlusive    a. LHC 6/08 EF 55-60%, 20-30% proximal LAD, LIs CFX, LIs RCA (Harwani); b. ETT-myoview 11/10, 3' stopped due to fatigue and chest tightness, EF normal, probably normal perfusion images with minimal soft tissue attenuation   Diastolic dysfunction  a. echo 2010: EF 60-65%, GR1DD, no AI, trivial MR, LA nl   GERD (gastroesophageal reflux disease)    Glaucoma    Hyperglycemia    Hyperlipidemia    Hypertension     Past Surgical History:  Procedure Laterality Date   ESOPHAGOGASTRODUODENOSCOPY (EGD) WITH PROPOFOL N/A 05/25/2022   Procedure: ESOPHAGOGASTRODUODENOSCOPY (EGD) WITH PROPOFOL;  Surgeon: Lin Landsman, MD;  Location: [facility];  Service: Gastroenterology;  Laterality: N/A;  RIDE IS ABOUT 20 MINUTES OUT   TONSILLECTOMY      Family History  Problem Relation Age of Onset   Uterine cancer Mother    Heart attack Father    Pulmonary embolism Brother    Hepatitis Brother    Coronary artery disease Brother    Coronary artery disease Other        Aunt    Social History   Socioeconomic History   Marital status: Single    Spouse name: Not on file   Number of children: Not on file   Years of education: Not on file   Highest education level: Not on file  Occupational History   Occupation: Retired    Fish farm manager: RETIRED  Tobacco Use   Smoking status: Former    Packs/day: 0.50    Years: 5.00    Total pack years: 2.50    Types: Cigarettes    Quit date: 09/17/1978    Years since quitting:  44.1   Smokeless tobacco: Never   Tobacco comments:    unsure how long she smoked for--10/27/2020  Vaping Use   Vaping Use: Never used  Substance and Sexual Activity   Alcohol use: No   Drug use: No   Sexual activity: Not Currently    Partners: Male    Birth control/protection: Post-menopausal  Other Topics Concern   Not on file  Social History Narrative   Divorced   Lives alone   No children   Does not get regular exercise   Social Determinants of Health   Financial Resource Strain: Medium Risk (07/05/2022)   Overall Financial Resource Strain (CARDIA)    Difficulty of Paying Living Expenses: Somewhat hard  Food Insecurity: Unknown (07/05/2022)   Hunger Vital Sign    Worried About Running Out of Food in the Last Year: Never true    Ran Out of Food in the Last Year: Not on file  Transportation Needs: No Transportation Needs (07/05/2022)   PRAPARE - Hydrologist (Medical): No    Lack of Transportation (Non-Medical): No  Physical Activity: Inactive (07/05/2022)   Exercise Vital Sign    Days of Exercise per Week: 0 days    Minutes of Exercise per Session: 0 min  Stress: No Stress Concern Present (07/05/2022)   [place]    Feeling of Stress : Not at all  Social Connections: Moderately Integrated (07/05/2022)   Social Connection and Isolation Panel [NHANES]    Frequency of Communication with Friends and Family: Once a week    Frequency of Social Gatherings with Friends and Family: Twice a week    Attends Religious Services: More than 4 times per year    Active Member of [employer] or Organizations: No    Attends Music therapist: More than 4 times per year    Marital Status: Divorced  Intimate Partner Violence: Not At Risk (07/05/2022)   Humiliation, Afraid, Rape, and Kick questionnaire    Fear of Current or Ex-Partner: No  Emotionally Abused: No    Physically Abused:  No    Sexually Abused: No    Review of Systems  Constitutional:  Negative for chills and fever.  Respiratory:  Positive for cough. Negative for sputum production, shortness of breath and wheezing.   Cardiovascular:  Negative for chest pain.  Neurological:  Negative for dizziness and headaches.      Objective    There were no vitals taken for this visit.  Physical Exam Constitutional:      Appearance: Normal appearance.  HENT:     Head: Normocephalic and atraumatic.  Eyes:     Conjunctiva/sclera: Conjunctivae normal.  Cardiovascular:     Rate and Rhythm: Normal rate and regular rhythm.  Pulmonary:     Effort: Pulmonary effort is normal.     Breath sounds: Normal breath sounds.  Musculoskeletal:     Right lower leg: No edema.     Left lower leg: No edema.  Skin:    General: Skin is warm and dry.  Neurological:     General: No focal deficit present.     Mental Status: She is alert. Mental status is at baseline.  Psychiatric:        Mood and Affect: Mood normal.        Behavior: Behavior normal.       Assessment & Plan:   1. Centrilobular emphysema ([place]): Still has some cough, much improved after her COPD exacerbation few weeks ago.  Continue Wixela daily and albuterol as needed, both refilled today.  - albuterol (PROAIR HFA) 108 (90 Base) MCG/ACT inhaler; Inhale 2 puffs into the lungs every 6 (six) hours as needed. Wheezing or shortness of breath.  Dispense: 18 g; Refill: 10 - fluticasone-salmeterol (WIXELA INHUB) 250-50 MCG/ACT AEPB; Inhale 1 puff into the lungs in the morning and at bedtime.  Dispense: 180 each; Refill: 0  2. Essential hypertension: Chronic and stable.  Continue amlodipine 5 mg, metoprolol 25 mg daily, refilled today.  - amLODipine (NORVASC) 5 MG tablet; Take 1 tablet (5 mg total) by mouth daily.  Dispense: 90 tablet; Refill: 1 - metoprolol succinate (TOPROL-XL) 25 MG 24 hr tablet; Take 1 tablet (25 mg total) by mouth daily.  Dispense: 90 tablet;  Refill: 1  3. Coronary artery disease, non-occlusive/Mixed hyperlipidemia: Stable.  Plan to check fasting labs at follow-up.  Continue Zocor 40 mg, refilled.  - simvastatin (ZOCOR) 40 MG tablet; Take 1 tablet (40 mg total) by mouth daily.  Dispense: 90 tablet; Refill: 1  4. Gastroesophageal reflux disease, unspecified whether esophagitis present: Stable, better since she finished her oral fluconazole.  Continue pantoprazole 40 mg, refilled.  - pantoprazole (PROTONIX) 40 MG tablet; Take 1 tablet (40 mg total) by mouth daily.  Dispense: 30 tablet; Refill: 3  5. Anxiety: Much improved.  Continue Lexapro 10 mg, refilled.  - escitalopram (LEXAPRO) 10 MG tablet; Take 1 tablet (10 mg total) by mouth daily.  Dispense: 90 tablet; Refill: 1  No follow-ups on file.   Teodora Medici, DO

## 2022-10-30 ENCOUNTER — Ambulatory Visit (INDEPENDENT_AMBULATORY_CARE_PROVIDER_SITE_OTHER): Payer: 59 | Admitting: Internal Medicine

## 2022-10-30 ENCOUNTER — Encounter: Payer: Self-pay | Admitting: Internal Medicine

## 2022-10-30 VITALS — BP 138/78 | HR 67 | Temp 98.0°F | Resp 16 | Wt 208.8 lb

## 2022-10-30 DIAGNOSIS — J432 Centrilobular emphysema: Secondary | ICD-10-CM

## 2022-10-30 DIAGNOSIS — I10 Essential (primary) hypertension: Secondary | ICD-10-CM

## 2022-10-30 DIAGNOSIS — J302 Other seasonal allergic rhinitis: Secondary | ICD-10-CM | POA: Diagnosis not present

## 2022-10-30 DIAGNOSIS — E782 Mixed hyperlipidemia: Secondary | ICD-10-CM

## 2022-10-30 DIAGNOSIS — F419 Anxiety disorder, unspecified: Secondary | ICD-10-CM

## 2022-10-30 DIAGNOSIS — K219 Gastro-esophageal reflux disease without esophagitis: Secondary | ICD-10-CM

## 2022-10-30 MED ORDER — FLUTICASONE PROPIONATE 50 MCG/ACT NA SUSP: 2.0000 | Freq: Every day | NASAL | 6 refills | Status: DC

## 2022-10-30 MED ORDER — LORATADINE 10 MG PO TABS: 10.0000 mg | ORAL_TABLET | Freq: Every day | ORAL | 11 refills | Status: DC

## 2022-10-30 NOTE — Patient Instructions (Addendum)
It was great seeing you today!  Plan discussed at today's visit: -Do Flonase (nasal spray) 2 sprays on each side twice a day and start an oral anti-histamine over the counter like Claritin, Zyrtec or Allegra    Follow up in: May  Take care and let us know if you have any questions or concerns prior to your next visit.  Dr. Rosana Berger

## 2022-10-31 DIAGNOSIS — I10 Essential (primary) hypertension: Secondary | ICD-10-CM | POA: Diagnosis not present

## 2022-10-31 DIAGNOSIS — L600 Ingrowing nail: Secondary | ICD-10-CM | POA: Diagnosis not present

## 2022-10-31 DIAGNOSIS — M2042 Other hammer toe(s) (acquired), left foot: Secondary | ICD-10-CM | POA: Diagnosis not present

## 2022-10-31 DIAGNOSIS — M778 Other enthesopathies, not elsewhere classified: Secondary | ICD-10-CM | POA: Diagnosis not present

## 2022-10-31 DIAGNOSIS — R7303 Prediabetes: Secondary | ICD-10-CM | POA: Diagnosis not present

## 2022-10-31 DIAGNOSIS — S90851A Superficial foreign body, right foot, initial encounter: Secondary | ICD-10-CM | POA: Diagnosis not present

## 2022-10-31 DIAGNOSIS — I25118 Atherosclerotic heart disease of native coronary artery with other forms of angina pectoris: Secondary | ICD-10-CM | POA: Diagnosis not present

## 2022-10-31 DIAGNOSIS — B351 Tinea unguium: Secondary | ICD-10-CM | POA: Diagnosis not present

## 2022-10-31 DIAGNOSIS — E785 Hyperlipidemia, unspecified: Secondary | ICD-10-CM | POA: Diagnosis not present

## 2022-10-31 DIAGNOSIS — M2041 Other hammer toe(s) (acquired), right foot: Secondary | ICD-10-CM | POA: Diagnosis not present

## 2022-11-07 ENCOUNTER — Other Ambulatory Visit: Payer: Self-pay

## 2022-11-07 DIAGNOSIS — I10 Essential (primary) hypertension: Secondary | ICD-10-CM

## 2022-11-07 MED ORDER — AMLODIPINE BESYLATE 5 MG PO TABS: 5.0000 mg | ORAL_TABLET | Freq: Every day | ORAL | 1 refills | Status: DC

## 2022-11-07 MED ORDER — NITROGLYCERIN 0.4 MG SL SUBL: 0.4000 mg | SUBLINGUAL_TABLET | SUBLINGUAL | 1 refills | Status: AC | PRN

## 2022-11-07 NOTE — Telephone Encounter (Signed)
New pharmacy

## 2022-11-14 NOTE — Progress Notes (Unsigned)
   Acute Office Visit  Subjective:     Patient ID: Kellie Phelps, female    DOB: 07/29/1946, 77 y.o.   MRN: [id number]  No chief complaint on file.   HPI Patient is in today for congestion.   URI Compliant:   -Worst symptom: -Fever: {Blank single:19197::"yes","no"} -Cough: {Blank single:19197::"yes","no"} -Shortness of breath: {Blank single:19197::"yes","no"} -Wheezing: {Blank single:19197::"yes","no"} -Chest pain: {Blank single:19197::"yes","no","yes, with cough"} -Chest tightness: {Blank single:19197::"yes","no"} -Chest congestion: {Blank single:19197::"yes","no"} -Nasal congestion: {Blank single:19197::"yes","no"} -Runny nose: {Blank single:19197::"yes","no"} -Post nasal drip: {Blank single:19197::"yes","no"} -Sneezing: {Blank single:19197::"yes","no"} -Sore throat: {Blank single:19197::"yes","no"} -Swollen glands: {Blank single:19197::"yes","no"} -Sinus pressure: {Blank single:19197::"yes","no"} -Headache: {Blank single:19197::"yes","no"} -Face pain: {Blank single:19197::"yes","no"} -Toothache: {Blank single:19197::"yes","no"} -Ear pain: {Blank single:19197::"yes","no"} {Blank single:19197::""right","left", "bilateral"} -Ear pressure: {Blank single:19197::"yes","no"} {Blank single:19197::""right","left", "bilateral"} -Eyes red/itching:{Blank single:19197::"yes","no"} -Eye drainage/crusting: {Blank single:19197::"yes","no"}  -Vomiting: {Blank single:19197::"yes","no"} -Rash: {Blank single:19197::"yes","no"} -Fatigue: {Blank single:19197::"yes","no"} -Sick contacts: {Blank single:19197::"yes","no"} -Strep contacts: {Blank single:19197::"yes","no"}  -Context: {Blank multiple:19196::"better","worse","stable","fluctuating"} -Recurrent sinusitis: {Blank single:19197::"yes","no"} -Relief with OTC cold/cough medications: {Blank single:19197::"yes","no"}  -Treatments attempted: {Blank  multiple:19196::"none","cold/sinus","mucinex","anti-histamine","pseudoephedrine","cough syrup","antibiotics"}    ROS      Objective:    There were no vitals taken for this visit. {Vitals History (Optional):23777}  Physical Exam  No results found for any visits on 11/15/22.      Assessment & Plan:   Problem List Items Addressed This Visit   None   No orders of the defined types were placed in this encounter.   No follow-ups on file.  Teodora Medici, DO

## 2022-11-15 ENCOUNTER — Encounter: Payer: Self-pay | Admitting: Internal Medicine

## 2022-11-15 ENCOUNTER — Ambulatory Visit (INDEPENDENT_AMBULATORY_CARE_PROVIDER_SITE_OTHER): Payer: 59 | Admitting: Internal Medicine

## 2022-11-15 ENCOUNTER — Other Ambulatory Visit: Payer: Self-pay | Admitting: Internal Medicine

## 2022-11-15 ENCOUNTER — Ambulatory Visit: Payer: 59 | Admitting: Gastroenterology

## 2022-11-15 ENCOUNTER — Encounter: Payer: Self-pay | Admitting: Gastroenterology

## 2022-11-15 VITALS — BP 136/82 | HR 70 | Temp 97.9°F | Resp 16 | Ht 65.0 in | Wt 209.1 lb

## 2022-11-15 DIAGNOSIS — J0100 Acute maxillary sinusitis, unspecified: Secondary | ICD-10-CM

## 2022-11-15 MED ORDER — METHYLPREDNISOLONE 4 MG PO TBPK: ORAL_TABLET | ORAL | 0 refills | Status: DC

## 2022-11-15 MED ORDER — CETIRIZINE HCL 10 MG PO TABS: 10.0000 mg | ORAL_TABLET | Freq: Every day | ORAL | 11 refills | Status: DC

## 2022-11-15 MED ORDER — DOXYCYCLINE HYCLATE 100 MG PO TABS: 100.0000 mg | ORAL_TABLET | Freq: Two times a day (BID) | ORAL | 0 refills | Status: AC

## 2022-11-15 MED ORDER — DOXYCYCLINE HYCLATE 100 MG PO TABS: 100.0000 mg | ORAL_TABLET | Freq: Two times a day (BID) | ORAL | 0 refills | Status: DC

## 2022-11-15 NOTE — Patient Instructions (Addendum)
It was great seeing you today!  Plan discussed at today's visit: -Continue Flonase but use a nasal saline first to help with dryness and nose bleeds -Switch anti-histamine from Claritin to Zyrtec, can make you sleepy so take at night -Steroids and antibiotic prescribed   Follow up in: May, already scheduled   Take care and let us know if you have any questions or concerns prior to your next visit.  Dr. Rosana Berger

## 2022-11-15 NOTE — Telephone Encounter (Signed)
Pt called to check status of refill request, seeking Rxs today.  [phone] Requesting a call back from PCP's nurse specifically

## 2022-11-15 NOTE — Telephone Encounter (Signed)
Medication Refill - Medication: cetirizine (ZYRTEC) 10 MG tablet, doxycycline (VIBRA-TABS) 100 MG tablet, and methylPREDNISolone (MEDROL DOSEPAK) 4 MG TBPK tablet   Has the patient contacted their pharmacy? Yes.   Pt uses mail order but requests local pharmacy since she needs to start it today  Preferred Pharmacy (with phone number or street name):  [facility] [place] - [address] Phone: [phone]  Fax: [phone]     Has the patient been seen for an appointment in the last year OR does the patient have an upcoming appointment? Yes.    Agent: Please be advised that RX refills may take up to 3 business days. We ask that you follow-up with your pharmacy.

## 2022-11-16 ENCOUNTER — Ambulatory Visit: Payer: Medicare Other | Admitting: Primary Care

## 2022-11-26 DIAGNOSIS — M62838 Other muscle spasm: Secondary | ICD-10-CM | POA: Diagnosis not present

## 2022-11-26 DIAGNOSIS — M25511 Pain in right shoulder: Secondary | ICD-10-CM | POA: Diagnosis not present

## 2022-11-26 DIAGNOSIS — M19011 Primary osteoarthritis, right shoulder: Secondary | ICD-10-CM | POA: Diagnosis not present

## 2022-11-28 NOTE — Congregational Nurse Program (Signed)
  Dept: [facility] Nurse Program Note  Date of Encounter: 11/28/2022  Clinic visit to check blood pressure and to look at bruise let arm from hitting arm on a car door. BP 110/60, O2 Sat 98%.  Has an 8 cm long healing bruise upper left arm just below the shoulder, area dark with yellow edges, no open areas or swelling noted. Discussed strategies to continue to keep blood pressure WNL. Past Medical History: Past Medical History:  Diagnosis Date   Allergy    Anxiety    Asthma    Atypical chest pain    Cataract    COPD (chronic obstructive pulmonary disease) (HCC)    Coronary artery disease, non-occlusive    a. LHC 6/08 EF 55-60%, 20-30% proximal LAD, LIs CFX, LIs RCA (Harwani); b. ETT-myoview 11/10, 3' stopped due to fatigue and chest tightness, EF normal, probably normal perfusion images with minimal soft tissue attenuation   Diastolic dysfunction    a. echo 2010: EF 60-65%, GR1DD, no AI, trivial MR, LA nl   GERD (gastroesophageal reflux disease)    Glaucoma    Hyperglycemia    Hyperlipidemia    Hypertension     Encounter Details:  CNP Questionnaire - 11/28/22 1300       Questionnaire   Ask client: Do you give verbal consent for me to treat you today? Yes    Student Assistance N/A    Location Patient [facility]    Visit Setting with Client Organization    Patient Status Unknown   Has own apartment at [facility];Medicare    Insurance/Financial Assistance Referral N/A    Medical Provider Yes    Screening Referrals Made N/A    Medical Referrals Made N/A    Medical Appointment Made N/A    Recently w/o PCP, now 1st time PCP visit completed due to CNs referral or appointment made N/A    Food N/A    Transportation Need transportation assistance    Housing/Utilities N/A    Interpersonal Safety N/A    Interventions Counsel;Advocate/Support;Educate    Abnormal to Normal Screening Since Last CN Visit N/A    Screenings CN  Performed Blood Pressure;Pulse Ox    Sent Client to Lab for: N/A    Did client attend any of the following based off CNs referral or appointments made? N/A    ED Visit Averted N/A    Life-Saving Intervention Made N/A

## 2022-12-17 ENCOUNTER — Other Ambulatory Visit: Payer: Self-pay

## 2022-12-17 DIAGNOSIS — K219 Gastro-esophageal reflux disease without esophagitis: Secondary | ICD-10-CM

## 2022-12-17 MED ORDER — PANTOPRAZOLE SODIUM 40 MG PO TBEC: 40.0000 mg | DELAYED_RELEASE_TABLET | Freq: Every day | ORAL | 1 refills | Status: DC

## 2022-12-17 NOTE — Telephone Encounter (Signed)
New pahrmacy

## 2023-01-10 NOTE — Progress Notes (Signed)
Established Patient Office Visit  Subjective    Patient ID: Kellie Phelps, female    DOB: [birth year]  Age: 77 y.o. MRN: [id number]  CC:  Chief Complaint  Patient presents with   Follow-up   URI    Runny nose, sore throat, headache, cough.  Refused covid test    HPI Kellie Phelps presents for follow up.  URI Compliant:  -Worst symptom: cough - symptoms started 3 days ago  -Fever: no -Cough: yes, dry  -Shortness of breath: no -Wheezing: no -Nasal congestion: yes -Runny nose: yes -Post nasal drip: yes -Sneezing: yes -Sore throat: yes -Sinus pressure: yes -Headache: yes -Face pain: yes -Ear pain: yes bilateral -Ear pressure: yes bilateral -Sick contacts: yes -Context: worse -Treatments attempted: nothing    Hypertension/Diastolic HF: -Medications: Amlodipine 5 mg, Metoprolol 25 mg, Nitroglycerin PRN -Patient is compliant with above medications and reports no side effects. -Checking BP at home (average): 120-130/60-70, stable  -Denies any SOB, CP, vision changes, LE edema or symptoms of hypotension -Follows with cardiology in [place], had a stress test on 10/13/21, EF 62% with small reversible defect of the anteroseptal wall in the apical and mid segments but otherwise normal.  -Last echo 05/12/2019 showing EF 60-65% with left ventricular diastolic impaired relaxation.   HLD/CAD: -Medications: Zocor 40 mg, aspirin 81 mg, fish oil  -Patient is compliant with above medications and reports no side effects.  -Last lipid panel: Lipid Panel     Component Value Date/Time   CHOL 143 01/18/2022 1350   CHOL 155 03/31/2015 1439   TRIG 128 01/18/2022 1350   HDL 47 (L) 01/18/2022 1350   HDL 45 03/31/2015 1439   CHOLHDL 3.0 01/18/2022 1350   VLDL 27.6 10/17/2012 0946   LDLCALC 75 01/18/2022 1350   LABVLDL 43 (H) 03/31/2015 1439    COPD/Asthma: -Following with [facility] -COPD status: exacerbated -Current medications: Advair, Albuterol  PRN. -Satisfied with current treatment?: yes -Oxygen use: no -Dyspnea frequency: with exertion occasionally -Cough frequency: yes, current  -Rescue inhaler frequency:  about twice a day  -Wheezing: No -Limitation of activity: no -Productive cough: no -Last Spirometry/PFTs: 2019 -Pneumovax: Not up to Date, continues to politely decline -Influenza: Not up to Date  GERD: -Currently on Pantoprazole 40 mg -Following with GI, note reviewed from 03/06/22.  Had EEG on 05/25/2022 which showed diffuse mildly erythematous mucosa without bleeding in the gastric fundus and gastric body.  Biopsy showed esophageal candidiasis and she was treated with oral fluconazole.  Glaucoma: -Currently on Dorzolamide 2% ophthalmic solution, following with Dr. Alvester Morin  -Had both cataracts done this past May, doing well   Seasonal Allergies: -Currently using Flonase  -Not on an oral antihistamine   Anxiety: -Duration:stable -Currently on Lexapro 10 mg daily  -She is compliant with medications and denies side effects. -Had been on Diazepam in the past, discussed how this medication would not be prescribed here. She was given Hydroxyzine 10 mg at her LOV for as needed anxiety, she took it once and felt very sleepy.      01/11/2023    1:10 PM 11/15/2022    9:28 AM 10/30/2022    8:16 AM 09/06/2022    8:24 AM 07/05/2022   11:03 AM  Depression screen PHQ 2/9  Decreased Interest 0 0 0 0 0  Down, Depressed, Hopeless 0 0 0 0 0  PHQ - 2 Score 0 0 0 0 0  Altered sleeping 0 0 0 0 0  Tired, decreased energy 0 0  0 0 0  Change in appetite 0 0 0 0 0  Feeling bad or failure about yourself  0 0 0 0 0  Trouble concentrating 0 0 0 0 0  Moving slowly or fidgety/restless 0 0 0 0 0  Suicidal thoughts 0 0 0 0 0  PHQ-9 Score 0 0 0 0 0  Difficult doing work/chores Not difficult at all Not difficult at all Not difficult at all Not difficult at all    Elevated Liver Enzymes: -Labs from 01/18/22: AST 43, ALT 49 -Denies Tylenol or  alcohol use  Pre-Diabetes: -A1c 01/18/22 5.8% -Not currently on medication   Health Maintenance: -Blood work UTD -Mammogram 11/23, Birads-1   Outpatient Encounter Medications as of 01/11/2023  Medication Sig   albuterol (PROAIR HFA) 108 (90 Base) MCG/ACT inhaler Inhale 2 puffs into the lungs every 6 (six) hours as needed. Wheezing or shortness of breath.   amLODipine (NORVASC) 5 MG tablet Take 1 tablet (5 mg total) by mouth daily.   aspirin 81 MG tablet Take 81 mg by mouth daily.   Calcium Carbonate-Vitamin D 600-200 MG-UNIT TABS Take by mouth.   dorzolamide (TRUSOPT) 2 % ophthalmic solution    escitalopram (LEXAPRO) 10 MG tablet Take 1 tablet (10 mg total) by mouth daily.   fluticasone (FLONASE) 50 MCG/ACT nasal spray Place 2 sprays into both nostrils daily.   fluticasone-salmeterol (ADVAIR) 250-50 MCG/ACT AEPB INHALE 1 PUFF INTO THE LUNGS IN THE MORNING AND AT BEDTIME   metoprolol succinate (TOPROL-XL) 25 MG 24 hr tablet Take 1 tablet (25 mg total) by mouth daily.   Multiple Vitamins-Minerals (ONE-A-DAY WOMENS 50 PLUS PO) Take by mouth daily.   nitroGLYCERIN (NITROSTAT) 0.4 MG SL tablet Place 1 tablet (0.4 mg total) under the tongue every 5 (five) minutes as needed for chest pain.   Omega-3 Fatty Acids (FISH OIL) 1000 MG CAPS 1,000 mg. Take two tablets by mouth daily.   pantoprazole (PROTONIX) 40 MG tablet Take 1 tablet (40 mg total) by mouth daily.   simvastatin (ZOCOR) 40 MG tablet Take 1 tablet (40 mg total) by mouth daily.   VYZULTA 0.024 % SOLN    [DISCONTINUED] cetirizine (ZYRTEC) 10 MG tablet Take 1 tablet (10 mg total) by mouth daily.   [DISCONTINUED] methylPREDNISolone (MEDROL DOSEPAK) 4 MG TBPK tablet Day 1: Take 8 mg (2 tablets) before breakfast, 4 mg (1 tablet) after lunch, 4 mg (1 tablet) after supper, and 8 mg (2 tablets) at bedtime. Day 2:Take 4 mg (1 tablet) before breakfast, 4 mg (1 tablet) after lunch, 4 mg (1 tablet) after supper, and 8 mg (2 tablets) at bedtime. Day 3:  Take 4 mg (1 tablet) before breakfast, 4 mg (1 tablet) after lunch, 4 mg (1 tablet) after supper, and 4 mg (1 tablet) at bedtime. Day 4: Take 4 mg (1 tablet) before breakfast, 4 mg (1 tablet) after lunch, and 4 mg (1 tablet) at bedtime. Day 5: Take 4 mg (1 tablet) before breakfast and 4 mg (1 tablet) at bedtime. Day 6: Take 4 mg (1 tablet) before breakfast.   No facility-administered encounter medications on file as of 01/11/2023.    Past Medical History:  Diagnosis Date   Allergy    Anxiety    Asthma    Atypical chest pain    Cataract    COPD (chronic obstructive pulmonary disease) (HCC)    Coronary artery disease, non-occlusive    a. LHC 6/08 EF 55-60%, 20-30% proximal LAD, LIs CFX, LIs RCA (Harwani); b.  ETT-myoview 11/10, 3' stopped due to fatigue and chest tightness, EF normal, probably normal perfusion images with minimal soft tissue attenuation   Diastolic dysfunction    a. echo 2010: EF 60-65%, GR1DD, no AI, trivial MR, LA nl   GERD (gastroesophageal reflux disease)    Glaucoma    Hyperglycemia    Hyperlipidemia    Hypertension     Past Surgical History:  Procedure Laterality Date   ESOPHAGOGASTRODUODENOSCOPY (EGD) WITH PROPOFOL N/A 05/25/2022   Procedure: ESOPHAGOGASTRODUODENOSCOPY (EGD) WITH PROPOFOL;  Surgeon: Toney Reil, MD;  Location: [facility] ENDOSCOPY;  Service: Gastroenterology;  Laterality: N/A;  RIDE IS ABOUT 20 MINUTES OUT   TONSILLECTOMY      Family History  Problem Relation Age of Onset   Uterine cancer Mother    Heart attack Father    Pulmonary embolism Brother    Hepatitis Brother    Coronary artery disease Brother    Coronary artery disease Other        Aunt    Social History   Socioeconomic History   Marital status: Single    Spouse name: Not on file   Number of children: Not on file   Years of education: Not on file   Highest education level: GED or equivalent  Occupational History   Occupation: Retired    Associate Professor: RETIRED  Tobacco Use    Smoking status: Former    Packs/day: 0.50    Years: 5.00    Additional pack years: 0.00    Total pack years: 2.50    Types: Cigarettes    Quit date: 09/17/1978    Years since quitting: 44.3   Smokeless tobacco: Never   Tobacco comments:    unsure how long she smoked for--10/27/2020  Vaping Use   Vaping Use: Never used  Substance and Sexual Activity   Alcohol use: No   Drug use: No   Sexual activity: Not Currently    Partners: Male    Birth control/protection: Post-menopausal  Other Topics Concern   Not on file  Social History Narrative   Divorced   Lives alone   No children   Does not get regular exercise   Social Determinants of Health   Financial Resource Strain: Low Risk  (01/07/2023)   Overall Financial Resource Strain (CARDIA)    Difficulty of Paying Living Expenses: Not hard at all  Food Insecurity: No Food Insecurity (01/07/2023)   Hunger Vital Sign    Worried About Running Out of Food in the Last Year: Never true    Ran Out of Food in the Last Year: Never true  Transportation Needs: No Transportation Needs (01/07/2023)   PRAPARE - Administrator, Civil Service (Medical): No    Lack of Transportation (Non-Medical): No  Physical Activity: Insufficiently Active (01/07/2023)   Exercise Vital Sign    Days of Exercise per Week: 2 days    Minutes of Exercise per Session: 20 min  Stress: No Stress Concern Present (01/07/2023)   [employer] of Occupational Health - Occupational Stress Questionnaire    Feeling of Stress : Only a little  Social Connections: Moderately Integrated (01/07/2023)   Social Connection and Isolation Panel [NHANES]    Frequency of Communication with Friends and Family: Twice a week    Frequency of Social Gatherings with Friends and Family: Once a week    Attends Religious Services: More than 4 times per year    Active Member of [employer] or Organizations: No    Attends Club or  Organization Meetings: More than 4 times per year     Marital Status: Widowed  Intimate Partner Violence: Not At Risk (07/05/2022)   Humiliation, Afraid, Rape, and Kick questionnaire    Fear of Current or Ex-Partner: No    Emotionally Abused: No    Physically Abused: No    Sexually Abused: No    Review of Systems  Constitutional:  Negative for chills and fever.  HENT:  Positive for congestion, ear pain, sinus pain and sore throat.   Respiratory:  Positive for cough and sputum production. Negative for shortness of breath and wheezing.   Cardiovascular:  Negative for chest pain.  Neurological:  Negative for dizziness and headaches.      Objective    BP 126/72   Pulse 76   Temp 97.8 F (36.6 C)   Resp 18   Ht 5\' 5"  (1.651 m)   Wt 209 lb 3.2 oz (94.9 kg)   SpO2 94%   BMI 34.81 kg/m   Physical Exam Constitutional:      Appearance: Normal appearance.  HENT:     Head: Normocephalic and atraumatic.     Right Ear: Tympanic membrane, ear canal and external ear normal.     Left Ear: Tympanic membrane, ear canal and external ear normal.     Nose: Congestion present.     Mouth/Throat:     Mouth: Mucous membranes are moist.     Pharynx: Oropharynx is clear.  Eyes:     Conjunctiva/sclera: Conjunctivae normal.  Cardiovascular:     Rate and Rhythm: Normal rate and regular rhythm.  Pulmonary:     Effort: Pulmonary effort is normal.     Breath sounds: Wheezing present. No rhonchi or rales.  Skin:    General: Skin is warm and dry.  Neurological:     General: No focal deficit present.     Mental Status: She is alert. Mental status is at baseline.  Psychiatric:        Mood and Affect: Mood normal.        Behavior: Behavior normal.       Assessment & Plan:   1. COPD exacerbation (HCC): Will treat COPD exacerbation with prednisone 40 mg x 5 days as well as doxycycline.  Refill Advair and albuterol.  Cough suppressant prescribed.  Follow-up if symptoms worsen or fail to improve.  - fluticasone-salmeterol (ADVAIR) 250-50 MCG/ACT  AEPB; Inhale 1 puff into the lungs in the morning and at bedtime.  Dispense: 180 each; Refill: 1 - albuterol (PROAIR HFA) 108 (90 Base) MCG/ACT inhaler; Inhale 2 puffs into the lungs every 6 (six) hours as needed. Wheezing or shortness of breath.  Dispense: 18 g; Refill: 10 - predniSONE (DELTASONE) 20 MG tablet; Take 2 tablets (40 mg total) by mouth daily with breakfast for 5 days.  Dispense: 10 tablet; Refill: 0 - doxycycline (VIBRA-TABS) 100 MG tablet; Take 1 tablet (100 mg total) by mouth 2 (two) times daily for 5 days.  Dispense: 10 tablet; Refill: 0 - benzonatate (TESSALON) 100 MG capsule; Take 1 capsule (100 mg total) by mouth 2 (two) times daily as needed for cough.  Dispense: 20 capsule; Refill: 0  2. Essential hypertension: Blood pressure stable.  Continue metoprolol 25 mg daily, refilled.  - metoprolol succinate (TOPROL-XL) 25 MG 24 hr tablet; Take 1 tablet (25 mg total) by mouth daily.  Dispense: 90 tablet; Refill: 1  3. Mixed hyperlipidemia/Coronary artery disease, non-occlusive: Stable, continue simvastatin 40 mg, refilled.  - simvastatin (ZOCOR) 40 MG  tablet; Take 1 tablet (40 mg total) by mouth daily.  Dispense: 90 tablet; Refill: 1  4. Anxiety: Stable, doing well on Lexapro 10 mg, refilled.  - escitalopram (LEXAPRO) 10 MG tablet; Take 1 tablet (10 mg total) by mouth daily.  Dispense: 90 tablet; Refill: 1   Return in about 6 months (around 07/13/2023).   Margarita Mail, DO

## 2023-01-11 ENCOUNTER — Encounter: Payer: Self-pay | Admitting: Internal Medicine

## 2023-01-11 ENCOUNTER — Ambulatory Visit (INDEPENDENT_AMBULATORY_CARE_PROVIDER_SITE_OTHER): Payer: 59 | Admitting: Internal Medicine

## 2023-01-11 VITALS — BP 126/72 | HR 76 | Temp 97.8°F | Resp 18 | Ht 65.0 in | Wt 209.2 lb

## 2023-01-11 DIAGNOSIS — J441 Chronic obstructive pulmonary disease with (acute) exacerbation: Secondary | ICD-10-CM

## 2023-01-11 DIAGNOSIS — I2510 Atherosclerotic heart disease of native coronary artery without angina pectoris: Secondary | ICD-10-CM

## 2023-01-11 DIAGNOSIS — E782 Mixed hyperlipidemia: Secondary | ICD-10-CM

## 2023-01-11 DIAGNOSIS — I10 Essential (primary) hypertension: Secondary | ICD-10-CM | POA: Diagnosis not present

## 2023-01-11 DIAGNOSIS — F419 Anxiety disorder, unspecified: Secondary | ICD-10-CM

## 2023-01-11 MED ORDER — BENZONATATE 100 MG PO CAPS
100.0000 mg | ORAL_CAPSULE | Freq: Two times a day (BID) | ORAL | 0 refills | Status: DC | PRN
Start: 2023-01-11 — End: 2023-01-22

## 2023-01-11 MED ORDER — PREDNISONE 20 MG PO TABS
40.0000 mg | ORAL_TABLET | Freq: Every day | ORAL | 0 refills | Status: DC
Start: 2023-01-11 — End: 2023-01-17

## 2023-01-11 MED ORDER — ESCITALOPRAM OXALATE 10 MG PO TABS: 10.0000 mg | ORAL_TABLET | Freq: Every day | ORAL | 1 refills | Status: DC

## 2023-01-11 MED ORDER — DOXYCYCLINE HYCLATE 100 MG PO TABS: 100.0000 mg | ORAL_TABLET | Freq: Two times a day (BID) | ORAL | 0 refills | Status: DC

## 2023-01-11 MED ORDER — FLUTICASONE-SALMETEROL 250-50 MCG/ACT IN AEPB: 1.0000 | INHALATION_SPRAY | Freq: Two times a day (BID) | RESPIRATORY_TRACT | 1 refills | Status: DC

## 2023-01-11 MED ORDER — SIMVASTATIN 40 MG PO TABS: 40.0000 mg | ORAL_TABLET | Freq: Every day | ORAL | 1 refills | Status: DC

## 2023-01-11 MED ORDER — ALBUTEROL SULFATE HFA 108 (90 BASE) MCG/ACT IN AERS: 2.0000 | INHALATION_SPRAY | Freq: Four times a day (QID) | RESPIRATORY_TRACT | 10 refills | Status: DC | PRN

## 2023-01-11 MED ORDER — METOPROLOL SUCCINATE ER 25 MG PO TB24: 25.0000 mg | ORAL_TABLET | Freq: Every day | ORAL | 1 refills | Status: DC

## 2023-01-15 ENCOUNTER — Emergency Department: Payer: 59

## 2023-01-15 ENCOUNTER — Other Ambulatory Visit: Payer: Self-pay

## 2023-01-15 ENCOUNTER — Encounter: Payer: Self-pay | Admitting: Internal Medicine

## 2023-01-15 ENCOUNTER — Telehealth: Payer: Self-pay | Admitting: Internal Medicine

## 2023-01-15 ENCOUNTER — Observation Stay
Admission: EM | Admit: 2023-01-15 | Discharge: 2023-01-17 | Disposition: A | Discharge status: Home / Self Care | Payer: 59 | Attending: Internal Medicine | Admitting: Internal Medicine

## 2023-01-15 ENCOUNTER — Observation Stay: Payer: 59

## 2023-01-15 DIAGNOSIS — J4489 Other specified chronic obstructive pulmonary disease: Secondary | ICD-10-CM | POA: Diagnosis present

## 2023-01-15 DIAGNOSIS — J449 Chronic obstructive pulmonary disease, unspecified: Secondary | ICD-10-CM | POA: Diagnosis not present

## 2023-01-15 DIAGNOSIS — R42 Dizziness and giddiness: Secondary | ICD-10-CM | POA: Diagnosis not present

## 2023-01-15 DIAGNOSIS — J45909 Unspecified asthma, uncomplicated: Secondary | ICD-10-CM | POA: Insufficient documentation

## 2023-01-15 DIAGNOSIS — R29818 Other symptoms and signs involving the nervous system: Secondary | ICD-10-CM | POA: Diagnosis not present

## 2023-01-15 DIAGNOSIS — Z7982 Long term (current) use of aspirin: Secondary | ICD-10-CM | POA: Diagnosis not present

## 2023-01-15 DIAGNOSIS — Z87891 Personal history of nicotine dependence: Secondary | ICD-10-CM | POA: Diagnosis not present

## 2023-01-15 DIAGNOSIS — E785 Hyperlipidemia, unspecified: Secondary | ICD-10-CM | POA: Diagnosis present

## 2023-01-15 DIAGNOSIS — Z043 Encounter for examination and observation following other accident: Secondary | ICD-10-CM | POA: Diagnosis not present

## 2023-01-15 DIAGNOSIS — I6603 Occlusion and stenosis of bilateral middle cerebral arteries: Secondary | ICD-10-CM | POA: Diagnosis not present

## 2023-01-15 DIAGNOSIS — Z79899 Other long term (current) drug therapy: Secondary | ICD-10-CM | POA: Insufficient documentation

## 2023-01-15 DIAGNOSIS — I110 Hypertensive heart disease with heart failure: Secondary | ICD-10-CM | POA: Insufficient documentation

## 2023-01-15 DIAGNOSIS — F419 Anxiety disorder, unspecified: Secondary | ICD-10-CM | POA: Diagnosis present

## 2023-01-15 DIAGNOSIS — W19XXXA Unspecified fall, initial encounter: Secondary | ICD-10-CM | POA: Diagnosis not present

## 2023-01-15 DIAGNOSIS — R531 Weakness: Secondary | ICD-10-CM

## 2023-01-15 DIAGNOSIS — I2510 Atherosclerotic heart disease of native coronary artery without angina pectoris: Secondary | ICD-10-CM | POA: Diagnosis not present

## 2023-01-15 DIAGNOSIS — I10 Essential (primary) hypertension: Secondary | ICD-10-CM | POA: Diagnosis not present

## 2023-01-15 DIAGNOSIS — I6523 Occlusion and stenosis of bilateral carotid arteries: Secondary | ICD-10-CM | POA: Diagnosis not present

## 2023-01-15 DIAGNOSIS — J329 Chronic sinusitis, unspecified: Secondary | ICD-10-CM

## 2023-01-15 DIAGNOSIS — H8110 Benign paroxysmal vertigo, unspecified ear: Secondary | ICD-10-CM | POA: Diagnosis present

## 2023-01-15 DIAGNOSIS — I5030 Unspecified diastolic (congestive) heart failure: Secondary | ICD-10-CM | POA: Insufficient documentation

## 2023-01-15 DIAGNOSIS — R0902 Hypoxemia: Secondary | ICD-10-CM | POA: Diagnosis not present

## 2023-01-15 LAB — HEPATIC FUNCTION PANEL
ALT: 33 U/L (ref 0–44)
AST: 31 U/L (ref 15–41)
Albumin: 3.9 g/dL (ref 3.5–5.0)
Alkaline Phosphatase: 68 U/L (ref 38–126)
Bilirubin, Direct: 0.1 mg/dL (ref 0.0–0.2)
Indirect Bilirubin: 0.5 mg/dL (ref 0.3–0.9)
Total Bilirubin: 0.6 mg/dL (ref 0.3–1.2)
Total Protein: 6.7 g/dL (ref 6.5–8.1)

## 2023-01-15 LAB — URINALYSIS, ROUTINE W REFLEX MICROSCOPIC
Bilirubin Urine: NEGATIVE
Glucose, UA: NEGATIVE mg/dL
Hgb urine dipstick: NEGATIVE
Ketones, ur: NEGATIVE mg/dL
Nitrite: NEGATIVE
Protein, ur: NEGATIVE mg/dL
Specific Gravity, Urine: 1.016 (ref 1.005–1.030)
pH: 6 (ref 5.0–8.0)

## 2023-01-15 LAB — CBC
HCT: 44.6 % (ref 36.0–46.0)
Hemoglobin: 14.8 g/dL (ref 12.0–15.0)
MCH: 29.5 pg (ref 26.0–34.0)
MCHC: 33.2 g/dL (ref 30.0–36.0)
MCV: 88.8 fL (ref 80.0–100.0)
Platelets: 218 10*3/uL (ref 150–400)
RBC: 5.02 MIL/uL (ref 3.87–5.11)
RDW: 13.1 % (ref 11.5–15.5)
WBC: 7.2 10*3/uL (ref 4.0–10.5)
nRBC: 0 % (ref 0.0–0.2)

## 2023-01-15 LAB — BASIC METABOLIC PANEL
Anion gap: 9 (ref 5–15)
BUN: 15 mg/dL (ref 8–23)
CO2: 23 mmol/L (ref 22–32)
Calcium: 9 mg/dL (ref 8.9–10.3)
Chloride: 107 mmol/L (ref 98–111)
Creatinine, Ser: 0.82 mg/dL (ref 0.44–1.00)
GFR, Estimated: >60 mL/min (ref >60)
Glucose, Bld: 169 mg/dL — ABNORMAL HIGH (ref 70–99)
Potassium: 3.8 mmol/L (ref 3.5–5.1)
Sodium: 139 mmol/L (ref 135–145)

## 2023-01-15 LAB — TROPONIN I (HIGH SENSITIVITY)
Troponin I (High Sensitivity): 4 ng/L (ref <18)
Troponin I (High Sensitivity): 4 ng/L (ref <18)

## 2023-01-15 LAB — URINE DRUG SCREEN, QUALITATIVE (ARMC ONLY)
Amphetamines, Ur Screen: NOT DETECTED
Barbiturates, Ur Screen: NOT DETECTED
Benzodiazepine, Ur Scrn: NOT DETECTED
Cannabinoid 50 Ng, Ur ~~LOC~~: NOT DETECTED
Cocaine Metabolite,Ur ~~LOC~~: NOT DETECTED
MDMA (Ecstasy)Ur Screen: NOT DETECTED
Methadone Scn, Ur: NOT DETECTED
Opiate, Ur Screen: NOT DETECTED
Phencyclidine (PCP) Ur S: NOT DETECTED
Tricyclic, Ur Screen: NOT DETECTED

## 2023-01-15 LAB — VITAMIN B12: Vitamin B-12: 266 pg/mL (ref 180–914)

## 2023-01-15 MED ORDER — SENNOSIDES-DOCUSATE SODIUM 8.6-50 MG PO TABS: 1.0000 | ORAL_TABLET | Freq: Every evening | ORAL | Status: DC | PRN

## 2023-01-15 MED ORDER — FLUTICASONE PROPIONATE 50 MCG/ACT NA SUSP: 2.0000 | Freq: Every day | NASAL | Status: DC | PRN

## 2023-01-15 MED ORDER — STROKE: EARLY STAGES OF RECOVERY BOOK: Freq: Once | Status: AC

## 2023-01-15 MED ORDER — MOMETASONE FURO-FORMOTEROL FUM 200-5 MCG/ACT IN AERO
2.0000 | INHALATION_SPRAY | Freq: Two times a day (BID) | RESPIRATORY_TRACT | Status: DC
  Administered 2023-01-16 – 2023-01-17 (×4): 2 via RESPIRATORY_TRACT
  Filled 2023-01-15 (×2): qty 8.8

## 2023-01-15 MED ORDER — BENZONATATE 100 MG PO CAPS: 100.0000 mg | ORAL_CAPSULE | Freq: Two times a day (BID) | ORAL | Status: DC | PRN

## 2023-01-15 MED ORDER — ENOXAPARIN SODIUM 60 MG/0.6ML IJ SOSY
0.5000 mg/kg | PREFILLED_SYRINGE | INTRAMUSCULAR | Status: DC
  Administered 2023-01-15: 47.5 mg via SUBCUTANEOUS
  Filled 2023-01-15: qty 0.6

## 2023-01-15 MED ORDER — PANTOPRAZOLE SODIUM 40 MG PO TBEC
40.0000 mg | DELAYED_RELEASE_TABLET | Freq: Every day | ORAL | Status: DC
  Administered 2023-01-16 – 2023-01-17 (×2): 40 mg via ORAL
  Filled 2023-01-15 (×2): qty 1

## 2023-01-15 MED ORDER — GADOBUTROL 1 MMOL/ML IV SOLN
9.0000 mL | Freq: Once | INTRAVENOUS | Status: AC | PRN
  Administered 2023-01-15: 9 mL via INTRAVENOUS

## 2023-01-15 MED ORDER — ACETAMINOPHEN 160 MG/5ML PO SOLN: 650.0000 mg | ORAL | Status: DC | PRN

## 2023-01-15 MED ORDER — SODIUM CHLORIDE 0.9 % IV BOLUS
500.0000 mL | Freq: Once | INTRAVENOUS | Status: AC
  Administered 2023-01-15: 500 mL via INTRAVENOUS

## 2023-01-15 MED ORDER — ALBUTEROL SULFATE (2.5 MG/3ML) 0.083% IN NEBU: 3.0000 mL | INHALATION_SOLUTION | Freq: Four times a day (QID) | RESPIRATORY_TRACT | Status: DC | PRN

## 2023-01-15 MED ORDER — IOHEXOL 350 MG/ML SOLN
75.0000 mL | Freq: Once | INTRAVENOUS | Status: AC | PRN
  Administered 2023-01-15: 75 mL via INTRAVENOUS

## 2023-01-15 MED ORDER — NITROGLYCERIN 0.4 MG SL SUBL: 0.4000 mg | SUBLINGUAL_TABLET | SUBLINGUAL | Status: DC | PRN

## 2023-01-15 MED ORDER — ASPIRIN 81 MG PO CHEW
324.0000 mg | CHEWABLE_TABLET | Freq: Once | ORAL | Status: AC
  Administered 2023-01-15: 324 mg via ORAL
  Filled 2023-01-15: qty 4

## 2023-01-15 MED ORDER — DOXYCYCLINE HYCLATE 100 MG PO TABS
100.0000 mg | ORAL_TABLET | Freq: Two times a day (BID) | ORAL | Status: DC
  Administered 2023-01-16 (×2): 100 mg via ORAL
  Filled 2023-01-15 (×2): qty 1

## 2023-01-15 MED ORDER — AMLODIPINE BESYLATE 5 MG PO TABS: 5.0000 mg | ORAL_TABLET | Freq: Every day | ORAL | Status: DC

## 2023-01-15 MED ORDER — MIDAZOLAM HCL 2 MG/2ML IJ SOLN: 0.5000 mg | Freq: Four times a day (QID) | INTRAMUSCULAR | Status: DC | PRN

## 2023-01-15 MED ORDER — MECLIZINE HCL 25 MG PO TABS
25.0000 mg | ORAL_TABLET | Freq: Once | ORAL | Status: AC
  Administered 2023-01-15: 25 mg via ORAL
  Filled 2023-01-15: qty 1

## 2023-01-15 MED ORDER — ESCITALOPRAM OXALATE 10 MG PO TABS
10.0000 mg | ORAL_TABLET | Freq: Every day | ORAL | Status: DC
  Administered 2023-01-16 – 2023-01-17 (×2): 10 mg via ORAL
  Filled 2023-01-15 (×2): qty 1

## 2023-01-15 MED ORDER — METOPROLOL SUCCINATE ER 25 MG PO TB24: 25.0000 mg | ORAL_TABLET | Freq: Every day | ORAL | Status: DC

## 2023-01-15 MED ORDER — ACETAMINOPHEN 325 MG PO TABS: 650.0000 mg | ORAL_TABLET | ORAL | Status: DC | PRN

## 2023-01-15 MED ORDER — LORAZEPAM 2 MG/ML IJ SOLN
1.0000 mg | Freq: Four times a day (QID) | INTRAMUSCULAR | Status: DC | PRN
  Administered 2023-01-15: 1 mg via INTRAVENOUS
  Filled 2023-01-15: qty 1

## 2023-01-15 MED ORDER — ASPIRIN 81 MG PO CHEW
81.0000 mg | CHEWABLE_TABLET | Freq: Every day | ORAL | Status: DC
  Administered 2023-01-16 – 2023-01-17 (×2): 81 mg via ORAL
  Filled 2023-01-15 (×2): qty 1

## 2023-01-15 MED ORDER — MIDAZOLAM HCL 2 MG/2ML IJ SOLN: 2.0000 mg | Freq: Once | INTRAMUSCULAR | Status: DC

## 2023-01-15 MED ORDER — SIMVASTATIN 20 MG PO TABS
40.0000 mg | ORAL_TABLET | Freq: Every day | ORAL | Status: DC
  Administered 2023-01-16: 40 mg via ORAL
  Filled 2023-01-15: qty 2

## 2023-01-15 MED ORDER — ADULT MULTIVITAMIN W/MINERALS CH
ORAL_TABLET | Freq: Every day | ORAL | Status: DC
  Administered 2023-01-15 – 2023-01-17 (×3): 1 via ORAL
  Filled 2023-01-15 (×3): qty 1

## 2023-01-15 MED ORDER — ACETAMINOPHEN 650 MG RE SUPP: 650.0000 mg | RECTAL | Status: DC | PRN

## 2023-01-15 NOTE — Assessment & Plan Note (Signed)
Aspirin 81 mg daily, metoprolol succinate 25 mg daily, simvastatin 40 mg daily were resumed on admission Nitroglycerin 0.4 mg sublingual every 5 minutes.  For chest pain resumed

## 2023-01-15 NOTE — ED Triage Notes (Addendum)
Pt comes with c/o dizziness that started after she fell this am. Pt states no loc or hitting head. Pt states vomiting as well. Pt denies any pain. Pt stats she was trying to sit down on the toilet and must have missed it. Pt states she fell onto her side.   Pt was vomiting in triage.

## 2023-01-15 NOTE — Assessment & Plan Note (Signed)
Home metoprolol succinate 25 mg daily, amlodipine 5 mg daily

## 2023-01-15 NOTE — Telephone Encounter (Signed)
Charlotte with [facility] is calling in to follow up on paperwork that was faxed over regarding incontinence supplies for the pt. Please follow up with Claris Gower at [phone]

## 2023-01-15 NOTE — ED Triage Notes (Signed)
First RN note-  Pt BIB [facility] from home. Pt had a fall this morning and has been dizzy and vomiting since. Pt is not on blood thinners  EMS vitals. 138/83 135cbg 20G left hand.

## 2023-01-15 NOTE — Assessment & Plan Note (Signed)
Albuterol, 2 puffs inhalation every 6 hours.  For wheezing and shortness of breath Dulera 2 puff inhalation twice daily

## 2023-01-15 NOTE — H&P (Signed)
History and Physical   Kellie Phelps ZOX:[id number] DOB: 08/20/1946 DOA: 01/15/2023  PCP: Margarita Mail, DO  Patient coming from: Home via EMS  I have personally briefly reviewed patient's old medical records in [facility] EMR.  Chief Concern: Weakness, increased fall  HPI: [name] is a 77 year old female with history of hypertension, hyperlipidemia, CAD, history of COPD follows with [facility] pul[facility]ology, GERD, anxiety, who presents emergency department from home for chief concerns of falling and increased weakness.  Patient not able to ambulate after trial in the emergency department.  Vitals showed temperature of 98, respiration rate of 18, heart rate of 64, blood pressure 142/81, SpO2 of 95% on room air.  Serum sodium is 139, potassium 3.8, chloride 107, bicarb 23, BUN of 15, serum creatinine 0.82, EGFR greater than 60, nonfasting blood glucose 169, WBC 7.2, hemoglobin 14.8, platelets of 218.  High sensitive troponin was 4.  UA showed trace leukocytes.  CTA head and neck w and wo contrast ordered by EDP: Was read as right posterior inferior cerebellar artery and left anterior inferior cerebellar artery are poorly delineated.  This may be due to developmentally small vessel size however vessel occlusion at the sites cannot be excluded.  ED treatment: Versed 2 mg IV one-time dose, sodium chloride 500 mL bolus, meclizine 25 mg p.o., aspirin 324 mg p.o. one-time dose.  EDP discussed case with neurology, Dr. Otelia Limes who recommends MRI of the brain with and without contrast. ----------------------------- At bedside, patient was sleeping peacefully.  She was arouse easily and was able to tell me her name, age, location, current calendar year.     She reports that today she felt dizzy.  Yesterday she was walking to the bathroom and missed her toilet and fell onto her right hip.  She denies loss of consciousness or head trauma.  She denies dysuria, hematuria,  diarrhea, chest pain, shortness of breath, swelling of her lower extremities, additional trauma to her person.  She ports that at this time she feels better in terms of the dizziness.  She does not know what happened earlier today she could not ambulate due to dizziness.  Social history: She denies tobacco, EtOH, recreational drug use.  ROS: Constitutional: no weight change, no fever ENT/Mouth: no sore throat, no rhinorrhea Eyes: no eye pain, no vision changes Cardiovascular: no chest pain, no dyspnea,  no edema, no palpitations Respiratory: no cough, no sputum, no wheezing Gastrointestinal: no nausea, no vomiting, no diarrhea, no constipation Genitourinary: no urinary incontinence, no dysuria, no hematuria Musculoskeletal: no arthralgias, no myalgias Skin: no skin lesions, no pruritus, Neuro: + weakness, no loss of consciousness, no syncope, + dizziness Psych: no anxiety, no depression, + decrease appetite Heme/Lymph: no bruising, no bleeding  ED Course: Discussed with emergency medicine provider, patient requiring hospitalization for chief concerns of weakness, increased falling.  Assessment/Plan  Principal Problem:   Weakness Active Problems:   Hyperlipidemia   Essential hypertension   COPD (chronic obstructive pulmonary disease) (HCC)   Coronary artery disease, non-occlusive   Anxiety   Assessment and Plan:  * Weakness Etiology workup in progress MRI of the brain with and without contrast has been ordered and pending EDP consulted neurology, Dr. Otelia Limes Fall precautions Check B12 level Admit to telemetry medical, observation  Anxiety Resumed home escitalopram 10 mg daily Versed 0.5 mg IV every 6 hours as needed for agitation, anxiety, 15 hours of coverage ordered  Coronary artery disease, non-occlusive Aspirin 81 mg daily, metoprolol succinate 25 mg daily, simvastatin  40 mg daily were resumed on admission Nitroglycerin 0.4 mg sublingual every 5 minutes.  For chest  pain resumed  COPD (chronic obstructive pulmonary disease) (HCC) Albuterol, 2 puffs inhalation every 6 hours.  For wheezing and shortness of breath Dulera 2 puff inhalation twice daily  Essential hypertension Home metoprolol succinate 25 mg daily, amlodipine 5 mg daily  Chart reviewed.   DVT prophylaxis: Enoxaparin Code Status: Full code Diet: Heart healthy Family Communication: No Disposition Plan: Pending clinical course, possible discharge on 01/16/2023 Consults called: EDP consulted neurology Admission status: Telemetry medical, observation  Past Medical History:  Diagnosis Date   Allergy    Anxiety    Asthma    Atypical chest pain    Cataract    COPD (chronic obstructive pulmonary disease) (HCC)    Coronary artery disease, non-occlusive    a. LHC 6/08 EF 55-60%, 20-30% proximal LAD, LIs CFX, LIs RCA (Harwani); b. ETT-myoview 11/10, 3' stopped due to fatigue and chest tightness, EF normal, probably normal perfusion images with minimal soft tissue attenuation   Diastolic dysfunction    a. echo 2010: EF 60-65%, GR1DD, no AI, trivial MR, LA nl   GERD (gastroesophageal reflux disease)    Glaucoma    Hyperglycemia    Hyperlipidemia    Hypertension    Past Surgical History:  Procedure Laterality Date   ESOPHAGOGASTRODUODENOSCOPY (EGD) WITH PROPOFOL N/A 05/25/2022   Procedure: ESOPHAGOGASTRODUODENOSCOPY (EGD) WITH PROPOFOL;  Surgeon: Toney Reil, MD;  Location: [facility] ENDOSCOPY;  Service: Gastroenterology;  Laterality: N/A;  RIDE IS ABOUT 20 MINUTES OUT   TONSILLECTOMY     Social History:  reports that she quit smoking about 44 years ago. Her smoking use included cigarettes. She has a 2.50 pack-year smoking history. She has never used smokeless tobacco. She reports that she does not drink alcohol and does not use drugs.  Allergies  Allergen Reactions   Penicillins Hives   Family History  Problem Relation Age of Onset   Uterine cancer Mother    Heart attack Father     Pulmonary embolism Brother    Hepatitis Brother    Coronary artery disease Brother    Coronary artery disease Other        Aunt   Family history: Family history reviewed and pertinent for CAD.  Prior to Admission medications   Medication Sig Start Date End Date Taking? Authorizing Provider  albuterol (PROAIR HFA) 108 (90 Base) MCG/ACT inhaler Inhale 2 puffs into the lungs every 6 (six) hours as needed. Wheezing or shortness of breath. 01/11/23  Yes Margarita Mail, DO  amLODipine (NORVASC) 5 MG tablet Take 1 tablet (5 mg total) by mouth daily. 11/07/22  Yes Margarita Mail, DO  aspirin 81 MG tablet Take 81 mg by mouth daily.   Yes [provider]  Calcium Carbonate-Vitamin D 600-200 MG-UNIT TABS Take by mouth.   Yes [provider]  dorzolamide (TRUSOPT) 2 % ophthalmic solution Place 1 drop into both eyes 2 (two) times daily. 08/24/19  Yes [provider]  escitalopram (LEXAPRO) 10 MG tablet Take 1 tablet (10 mg total) by mouth daily. 01/11/23  Yes Margarita Mail, DO  fluticasone [facility]) 50 MCG/ACT nasal spray Place 2 sprays into both nostrils daily. 10/30/22  Yes Margarita Mail, DO  fluticasone-salmeterol (ADVAIR) 250-50 MCG/ACT AEPB Inhale 1 puff into the lungs in the morning and at bedtime. 01/11/23  Yes Margarita Mail, DO  metoprolol succinate (TOPROL-XL) 25 MG 24 hr tablet Take 1 tablet (25 mg total) by mouth  daily. 01/11/23  Yes Margarita Mail, DO  Multiple Vitamins-Minerals (ONE-A-DAY WOMENS 50 PLUS PO) Take by mouth daily.   Yes [provider]  nitroGLYCERIN (NITROSTAT) 0.4 MG SL tablet Place 1 tablet (0.4 mg total) under the tongue every 5 (five) minutes as needed for chest pain. 11/07/22  Yes Margarita Mail, DO  Omega-3 Fatty Acids (FISH OIL) 1000 MG CAPS 1,000 mg. Take two tablets by mouth daily.   Yes [provider]  pantoprazole (PROTONIX) 40 MG tablet Take 1 tablet (40 mg total) by mouth daily. 12/17/22  Yes  Margarita Mail, DO  simvastatin (ZOCOR) 40 MG tablet Take 1 tablet (40 mg total) by mouth daily. 01/11/23  Yes Margarita Mail, DO  VYZULTA 0.024 % SOLN Place 1 drop into both eyes every evening. 08/31/19  Yes [provider]  benzonatate (TESSALON) 100 MG capsule Take 1 capsule (100 mg total) by mouth 2 (two) times daily as needed for cough. Patient not taking: Reported on 01/15/2023 01/11/23   Margarita Mail, DO  doxycycline (VIBRA-TABS) 100 MG tablet Take 1 tablet (100 mg total) by mouth 2 (two) times daily for 5 days. Patient not taking: Reported on 01/15/2023 01/11/23 01/16/23  Margarita Mail, DO  predniSONE (DELTASONE) 20 MG tablet Take 2 tablets (40 mg total) by mouth daily with breakfast for 5 days. Patient not taking: Reported on 01/15/2023 01/11/23 01/16/23  Margarita Mail, DO   Physical Exam: Vitals:   01/15/23 1010 01/15/23 1328 01/15/23 1414 01/15/23 1630  BP: (!) 142/81 (!) 142/78  123/68  Pulse: 64 79  (!) 55  Resp: 18 16  14   Temp: 98 F (36.7 C)  97.8 F (36.6 C)   TempSrc:   Oral   SpO2: 95% 96%  94%   Constitutional: appears age-appropriate, frail, NAD, calm Eyes: PERRL, lids and conjunctivae normal ENMT: Mucous membranes are moist. Posterior pharynx clear of any exudate or lesions. Age-appropriate dentition. Hearing appropriate Neck: normal, supple, no masses, no thyromegaly Respiratory: clear to auscultation bilaterally, no wheezing, no crackles. Normal respiratory effort. No accessory muscle use.  Cardiovascular: Regular rate and rhythm, no murmurs / rubs / gallops. No extremity edema. 2+ pedal pulses. No carotid bruits.  Abdomen: obese abdomen, no tenderness, no masses palpated, no hepatosplenomegaly. Bowel sounds positive.  Musculoskeletal: no clubbing / cyanosis. No joint deformity upper and lower extremities. Good ROM, no contractures, no atrophy. Normal muscle tone.  Skin: no rashes, lesions, ulcers. No induration Neurologic: Sensation  intact. Strength 5/5 in all 4.  Psychiatric: Normal judgment and insight. Alert and oriented x 3. Normal mood.   EKG: independently reviewed, showing sinus rhythm with a rate of 61, QTc 428  Chest x-ray on Admission: I personally reviewed and I agree with radiologist reading as below.  CT Angio Head Neck W WO CM  Result Date: 01/15/2023 CLINICAL DATA:  Provided history: Vertigo, central. Additional history provided: Dizziness. Fall this morning. Episode of vomiting. EXAM: CT ANGIOGRAPHY HEAD AND NECK WITH AND WITHOUT CONTRAST TECHNIQUE: Multidetector CT imaging of the head and neck was performed using the standard protocol during bolus administration of intravenous contrast. Multiplanar CT image reconstructions and MIPs were obtained to evaluate the vascular anatomy. Carotid stenosis measurements (when applicable) are obtained utilizing NASCET criteria, using the distal internal carotid diameter as the denominator. RADIATION DOSE REDUCTION: This exam was performed according to the departmental dose-optimization program which includes automated exposure control, adjustment of the mA and/or kV according to patient size and/or use of iterative reconstruction technique. CONTRAST:  75mL OMNIPAQUE IOHEXOL 350 MG/ML SOLN COMPARISON:  Head CT 08/19/2010. MRA head 06/10/2008. FINDINGS: CT HEAD FINDINGS Brain: No age advanced or lobar predominant parenchymal atrophy. There is no acute intracranial hemorrhage. No demarcated cortical infarct. No extra-axial fluid collection. No evidence of an intracranial mass. No midline shift. Vascular: No hyperdense vessel. Atherosclerotic calcifications. Skull: No fracture or aggressive osseous lesion. Sinuses/Orbits: No orbital mass or acute orbital finding. No significant paranasal sinus disease. Review of the MIP images confirms the above findings CTA NECK FINDINGS Aortic arch: Standard aortic branching. Atherosclerotic plaque within the visualized aortic arch and proximal major  branch vessels of the neck. Streak and beam hardening artifact arising from a dense right-sided contrast bolus partially obscures the right subclavian artery. Within this limitation, there is no appreciable hemodynamically significant innominate or proximal subclavian artery stenosis. Right carotid system: CCA and ICA patent within the neck without hemodynamically significant stenosis (50% or greater). Mild sclerotic plaque within the proximal CCA, about the carotid bifurcation and within the proximal ICA. Left carotid system: CCA and ICA patent within the neck without hemodynamically significant stenosis (50% or greater). Mild atherosclerotic plaque at the CCA origin, about the carotid bifurcation and within the proximal ICA. Vertebral arteries: The vertebral arteries are patent within the neck. Calcified plaque at the origin of the right vertebral artery likely resulting in at least moderate stenosis. Venous reflux from a dense right-sided contrast bolus limits evaluation of the V1 and proximal V2 right vertebral artery. Within this limitation, no hemodynamically significant stenosis is identified elsewhere within the cervical right vertebral artery. Atherosclerotic plaque within the proximal V1 left vertebral artery resulting in moderate stenosis. Nonstenotic atherosclerotic plaque within the left vertebral artery at the V2 left vertebral artery at the C1 level. The left vertebral artery is dominant. Skeleton: Cervical spondylosis. No acute fracture or aggressive osseous lesion. Other neck: Subcentimeter calcified nodule within the left thyroid lobe not meeting consensus criteria for ultrasound follow-up based on size. No follow-up imaging recommended. Reference: J Am Coll Radiol. 2015 Feb;12(2): 143-50. Upper chest: No consolidation within the imaged lung apices. Review of the MIP images confirms the above findings CTA HEAD FINDINGS Anterior circulation: The intracranial internal carotid arteries are patent.  Calcified atherosclerotic plaque within both vessels with no more than mild stenosis. The M1 middle cerebral arteries are patent. Atherosclerotic irregularity of the M2 and more distal MCA vessels bilaterally. No M2 proximal branch occlusion or high-grade proximal stenosis. The anterior cerebral arteries are patent. No intracranial aneurysm is identified. Posterior circulation: The intracranial vertebral arteries are patent. The basilar artery is patent. The right posterior inferior cerebellar artery and left anterior inferior cerebellar artery are poorly delineated. The posterior cerebral arteries are patent. A left posterior communicating artery is present. The right posterior communicating artery is diminutive or absent. Venous sinuses: The dural venous sinuses are poorly assessed due to contrast timing. Anatomic variants: As described. Review of the MIP images confirms the above findings IMPRESSION: Non-contrast head CT: No evidence of an acute intracranial abnormality. CTA neck: 1. The common carotid and internal carotid arteries are patent within the neck without hemodynamically significant stenosis. Atherosclerotic plaque bilaterally, as described. 2. Vertebral arteries patent within the neck. Venous reflux from a dense right-sided contrast bolus limits evaluation of the V1 and proximal V2 right vertebral artery. Atherosclerotic plaque at the origin of the right vertebral artery, likely resulting in at least moderate stenosis. Moderate atherosclerotic narrowing at the origin of the left vertebral artery. 3.  Aortic Atherosclerosis (ICD10-I70.0).  CTA head: 1. The right posterior inferior cerebellar artery and left anterior inferior cerebellar artery are poorly delineated. This may be due to developmentally small vessel size. However, vessel occlusions at these sites cannot be excluded. Consider a brain MRI for further evaluation. 2. No intracranial large vessel occlusion or proximal high-grade arterial  stenosis identified elsewhere. 3. Intracranial atherosclerotic disease, as described. Electronically Signed   By: Jackey Loge D.O.   On: 01/15/2023 15:32   DG Chest Portable 1 View  Result Date: 01/15/2023 CLINICAL DATA:  Fall, dizziness EXAM: PORTABLE CHEST 1 VIEW COMPARISON:  Portable exam 1430 hours compared to 05/17/2022 FINDINGS: Normal heart size, mediastinal contours, and pulmonary vascularity. Lungs clear. No pulmonary infiltrate, pleural effusion, or pneumothorax. No acute osseous findings. IMPRESSION: No acute abnormalities. Electronically Signed   By: Ulyses Southward M.D.   On: 01/15/2023 14:29   DG Humerus Left  Result Date: 01/15/2023 CLINICAL DATA:  Fall EXAM: LEFT HUMERUS - 2 VIEW COMPARISON:  None Available. FINDINGS: There is no evidence of fracture or other focal bone lesions. Soft tissues are unremarkable. IMPRESSION: Negative. Electronically Signed   By: Wiliam Ke M.D.   On: 01/15/2023 14:28    Labs on Admission: I have personally reviewed following labs  CBC: Recent Labs  Lab 01/15/23 1011  WBC 7.2  HGB 14.8  HCT 44.6  MCV 88.8  PLT 218   Basic Metabolic Panel: Recent Labs  Lab 01/15/23 1011  NA 139  K 3.8  CL 107  CO2 23  GLUCOSE 169*  BUN 15  CREATININE 0.82  CALCIUM 9.0   GFR: Estimated Creatinine Clearance: 64.4 mL/min (by C-G formula based on SCr of 0.82 mg/dL).  Urine analysis:    Component Value Date/Time   COLORURINE YELLOW (A) 01/15/2023 1357   APPEARANCEUR CLEAR (A) 01/15/2023 1357   LABSPEC 1.016 01/15/2023 1357   PHURINE 6.0 01/15/2023 1357   GLUCOSEU NEGATIVE 01/15/2023 1357   HGBUR NEGATIVE 01/15/2023 1357   BILIRUBINUR NEGATIVE 01/15/2023 1357   KETONESUR NEGATIVE 01/15/2023 1357   PROTEINUR NEGATIVE 01/15/2023 1357   UROBILINOGEN 0.2 09/18/2013 1213   NITRITE NEGATIVE 01/15/2023 1357   LEUKOCYTESUR TRACE (A) 01/15/2023 1357   This document was prepared using Dragon Voice Recognition software and may include unintentional  dictation errors.  Dr. Sedalia Muta [facility]  If 7PM-7AM, please contact overnight-coverage provider If 7AM-7PM, please contact day attending provider [email or web address]  01/15/2023, 5:02 PM

## 2023-01-15 NOTE — Assessment & Plan Note (Addendum)
Resumed home escitalopram 10 mg daily Versed 0.5 mg IV every 6 hours as needed for agitation, anxiety, 15 hours of coverage ordered

## 2023-01-15 NOTE — ED Provider Notes (Signed)
[facility] Provider Note    Event Date/Time   First MD Initiated Contact with Patient 01/15/23 1253     (approximate)   History   Dizziness   HPI  Kellie Phelps is a 77 y.o. female past medical history significant for hypertension, HFpEF, hyperlipidemia, CAD, COPD, asthma, who presents to the emergency department with dizziness.  States that she got up at 5:00 this morning to go to the bathroom.  States that she missed the commode and had a fall landing on her left side.  Since that time states that she has been very dizzy and unable to stand or sit up.  States that she gets severely dizzy with rest or with minimal movement of her head.  Attempted to stand multiple times with ongoing dizziness.  Denies any extremity numbness or weakness.  Recent viral illness and was seen by her primary care physician and started on some steroids.  Denies prior history of a CVA.  Denies prior episodes of vertigo.  No tinnitus or change in hearing.     Physical Exam   Triage Vital Signs: ED Triage Vitals  Enc Vitals Group     BP 01/15/23 1010 (!) 142/81     Pulse Rate 01/15/23 1010 64     Resp 01/15/23 1010 18     Temp 01/15/23 1010 98 F (36.7 C)     Temp src --      SpO2 01/15/23 1010 95 %     Weight --      Height --      Head Circumference --      Peak Flow --      Pain Score 01/15/23 1009 0     Pain Loc --      Pain Edu? --      Excl. in GC? --     Most recent vital signs: Vitals:   01/15/23 1328 01/15/23 1414  BP: (!) 142/78   Pulse: 79   Resp: 16   Temp:  97.8 F (36.6 C)  SpO2: 96%     Physical Exam Constitutional:      Appearance: She is well-developed.  HENT:     Head: Atraumatic.     Right Ear: Tympanic membrane normal.     Left Ear: Tympanic membrane normal.  Eyes:     Conjunctiva/sclera: Conjunctivae normal.     Pupils: Pupils are equal, round, and reactive to light.  Cardiovascular:     Rate and Rhythm: Regular rhythm.   Pulmonary:     Effort: No respiratory distress.  Abdominal:     General: There is no distension.  Musculoskeletal:        General: Normal range of motion.     Cervical back: Normal range of motion.  Skin:    General: Skin is warm.     Capillary Refill: Capillary refill takes less than 2 seconds.  Neurological:     Mental Status: She is alert. Mental status is at baseline.     GCS: GCS eye subscore is 4. GCS verbal subscore is 5. GCS motor subscore is 6.     Cranial Nerves: Cranial nerves 2-12 are intact.     Coordination: Finger-Nose-Finger Test abnormal.     Gait: Gait abnormal.     Deep Tendon Reflexes: Reflexes are normal and symmetric.     IMPRESSION / MDM / ASSESSMENT AND PLAN / ED COURSE  I reviewed the triage vital signs and the nursing notes.  Differential diagnosis  concerning for possible central vertigo versus peripheral vertigo from BPPV or vestibular neuritis.  Patient is outside of the window for TNK.  Does not meet criteria for LVO.  Lab work with findings of questionable UTI, patient does not have any symptoms and do not believe that this is the cause of her vertiginous symptoms.  No significant electrolyte abnormalities.  Troponin is negative have low suspicion for ACS.  EKG  I, Corena Herter, the attending physician, personally viewed and interpreted this ECG.   Rate: Normal  Rhythm: Normal sinus  Axis: Normal  Intervals: Normal  ST&T Change: None Low voltage  No tachycardic or bradycardic dysrhythmias while on cardiac telemetry.  RADIOLOGY I independently reviewed imaging, my interpretation of imaging: CTA without signs of intracranial hemorrhage or infarction.  CTA was read as no intracranial hemorrhage.  Area of posterior circulation of questionable moderate stenosis recommended MRI.  LABS (all labs ordered are listed, but only abnormal results are displayed) Labs interpreted as -    Labs Reviewed  BASIC METABOLIC PANEL - Abnormal; Notable for  the following components:      Result Value   Glucose, Bld 169 (*)    All other components within normal limits  URINALYSIS, ROUTINE W REFLEX MICROSCOPIC - Abnormal; Notable for the following components:   Color, Urine YELLOW (*)    APPearance CLEAR (*)    Leukocytes,Ua TRACE (*)    Bacteria, UA RARE (*)    All other components within normal limits  CBC  CBG MONITORING, ED  TROPONIN I (HIGH SENSITIVITY)  TROPONIN I (HIGH SENSITIVITY)     MDM  Patient was given meclizine and IV fluids, given aspirin after CTA was negative for acute intracranial hemorrhage.  Ongoing vertiginous symptoms concerning for possible posterior circulation CVA.  Discussed with neurology Dr. Otelia Limes, recommended MRI with and without contrast with comments to pay attention to the vestibular system.  Consulted hospitalist for admission for CVA workup and ongoing workup for significant vertiginous symptoms.     PROCEDURES:  Critical Care performed: No  Procedures  Patient's presentation is most consistent with acute presentation with potential threat to life or bodily function.   MEDICATIONS ORDERED IN ED: Medications  midazolam (VERSED) injection 2 mg (has no administration in time range)  aspirin chewable tablet 324 mg (has no administration in time range)  sodium chloride 0.9 % bolus 500 mL (0 mLs Intravenous Stopped 01/15/23 1530)  iohexol (OMNIPAQUE) 350 MG/ML injection 75 mL (75 mLs Intravenous Contrast Given 01/15/23 1447)  meclizine (ANTIVERT) tablet 25 mg (25 mg Oral Given 01/15/23 1534)    FINAL CLINICAL IMPRESSION(S) / ED DIAGNOSES   Final diagnoses:  Dizziness  Vertigo     Rx / DC Orders   ED Discharge Orders     None        Note:  This document was prepared using Dragon voice recognition software and may include unintentional dictation errors.   Corena Herter, MD 01/15/23 [phone]

## 2023-01-15 NOTE — Assessment & Plan Note (Signed)
Etiology workup in progress MRI of the brain with and without contrast has been ordered and pending EDP consulted neurology, Dr. Otelia Limes Fall precautions Check B12 level Admit to telemetry medical, observation

## 2023-01-15 NOTE — Hospital Course (Signed)
PMH of HTN, HLD, CAD, COPD, GERD, anxiety presented to hospital with complaints of fall followed by vertigo. She was at home and had a fall in her bathroom hitting her left side.  After which she started having severe vertigo.  After considerable effort she finally got her back in bed and slept.  Then she called 911 when she woke up at the request of her niece and while trying to open up her door had another fall. Patient not able to ambulate after trial in the emergency department. CT head negative for acute stroke or bleeding or trauma. CTA negative for any large vessel occlusion. MRI brain with and without contrast negative for any acute stroke.  No evidence of concussion so far.  Concern for BPPV.

## 2023-01-16 DIAGNOSIS — R42 Dizziness and giddiness: Secondary | ICD-10-CM | POA: Diagnosis not present

## 2023-01-16 DIAGNOSIS — R531 Weakness: Secondary | ICD-10-CM | POA: Diagnosis not present

## 2023-01-16 LAB — BASIC METABOLIC PANEL
Anion gap: 10 (ref 5–15)
BUN: 15 mg/dL (ref 8–23)
CO2: 24 mmol/L (ref 22–32)
Calcium: 9.1 mg/dL (ref 8.9–10.3)
Chloride: 103 mmol/L (ref 98–111)
Creatinine, Ser: 1.04 mg/dL — ABNORMAL HIGH (ref 0.44–1.00)
GFR, Estimated: 55 mL/min — ABNORMAL LOW (ref >60)
Glucose, Bld: 98 mg/dL (ref 70–99)
Potassium: 3.8 mmol/L (ref 3.5–5.1)
Sodium: 137 mmol/L (ref 135–145)

## 2023-01-16 LAB — HEMOGLOBIN A1C
Hgb A1c MFr Bld: 5.7 % — ABNORMAL HIGH (ref 4.8–5.6)
Mean Plasma Glucose: 116.89 mg/dL

## 2023-01-16 LAB — LIPID PANEL
Cholesterol: 146 mg/dL (ref 0–200)
HDL: 47 mg/dL (ref >40)
LDL Cholesterol: 79 mg/dL (ref 0–99)
Total CHOL/HDL Ratio: 3.1 RATIO
Triglycerides: 102 mg/dL (ref <150)
VLDL: 20 mg/dL (ref 0–40)

## 2023-01-16 LAB — CBC
HCT: 42.3 % (ref 36.0–46.0)
Hemoglobin: 14.2 g/dL (ref 12.0–15.0)
MCH: 29.4 pg (ref 26.0–34.0)
MCHC: 33.6 g/dL (ref 30.0–36.0)
MCV: 87.6 fL (ref 80.0–100.0)
Platelets: 202 10*3/uL (ref 150–400)
RBC: 4.83 MIL/uL (ref 3.87–5.11)
RDW: 13.2 % (ref 11.5–15.5)
WBC: 6.2 10*3/uL (ref 4.0–10.5)
nRBC: 0 % (ref 0.0–0.2)

## 2023-01-16 LAB — MAGNESIUM: Magnesium: 2 mg/dL (ref 1.7–2.4)

## 2023-01-16 MED ORDER — FLUTICASONE PROPIONATE 50 MCG/ACT NA SUSP
2.0000 | Freq: Every day | NASAL | Status: DC
  Administered 2023-01-16 – 2023-01-17 (×2): 2 via NASAL
  Filled 2023-01-16: qty 16

## 2023-01-16 MED ORDER — LATANOPROST 0.005 % OP SOLN
1.0000 [drp] | Freq: Every day | OPHTHALMIC | Status: DC
  Administered 2023-01-16: 1 [drp] via OPHTHALMIC
  Filled 2023-01-16: qty 2.5

## 2023-01-16 MED ORDER — MECLIZINE HCL 25 MG PO TABS
25.0000 mg | ORAL_TABLET | Freq: Three times a day (TID) | ORAL | Status: DC
  Administered 2023-01-16 – 2023-01-17 (×4): 25 mg via ORAL
  Filled 2023-01-16 (×5): qty 1

## 2023-01-16 MED ORDER — DORZOLAMIDE HCL 2 % OP SOLN
1.0000 [drp] | Freq: Two times a day (BID) | OPHTHALMIC | Status: DC
  Administered 2023-01-16 – 2023-01-17 (×3): 1 [drp] via OPHTHALMIC
  Filled 2023-01-16: qty 10

## 2023-01-16 MED ORDER — POTASSIUM CHLORIDE CRYS ER 20 MEQ PO TBCR
20.0000 meq | EXTENDED_RELEASE_TABLET | Freq: Once | ORAL | Status: AC
  Administered 2023-01-16: 20 meq via ORAL
  Filled 2023-01-16: qty 1

## 2023-01-16 MED ORDER — ENOXAPARIN SODIUM 100 MG/ML IJ SOSY
90.0000 mg | PREFILLED_SYRINGE | INTRAMUSCULAR | Status: DC
  Administered 2023-01-16: 90 mg via SUBCUTANEOUS
  Filled 2023-01-16: qty 1

## 2023-01-16 MED ORDER — LACTATED RINGERS IV SOLN: INTRAVENOUS | Status: DC

## 2023-01-16 NOTE — Consult Note (Signed)
NEURO HOSPITALIST CONSULT NOTE   Requestig physician: Dr. Sedalia Muta  Reason for Consult: Acute vertigo after a fall  History obtained from:  Patient and Chart     HPI:                                                                                                                                          Kellie Phelps is an 77 y.o. female with a PMHx of atypical chest pain, COPD, CAD, diastolic dysfunction, HLD and HTN who presented to the ED yesterday morning after a fall at home. She stated that she had been trying to sit down on the toilet and missed it, falling onto her left side. Although she did not strike her head or experience LOC, she was dizzy with vomiting afterwards. She describes the dizziness as a room-spinning sensation. BP with EMS was 138/83, CBG 135. She continued to have emesis in Triage. She endorsed feeling persistently very dizzy in the ED, limiting her ability to stand or sit up. She would get severely dizzy with rest or minimal movement of her head. She had attempted to stand multiple times with ongoing dizziness. She denied any limb weakness or numbness. No ataxia. Also with no tinnitus or changes in hearing. She did have a recent viral illness for which she was started on steroids by her PCP. She has no prior history of vertigo. She states that she feels significantly better today than yesterday. When she lies still the vertigo is less likely to occur. When she lies on her right side vertigo is rated as 2/10; lying on her left side it is rated as 1/10. Yesterday, her vertigo was most severe when lying on her left side.   Past Medical History:  Diagnosis Date   Allergy    Anxiety    Asthma    Atypical chest pain    Cataract    COPD (chronic obstructive pulmonary disease) (HCC)    Coronary artery disease, non-occlusive    a. LHC 6/08 EF 55-60%, 20-30% proximal LAD, LIs CFX, LIs RCA (Harwani); b. ETT-myoview 11/10, 3' stopped due to fatigue and  chest tightness, EF normal, probably normal perfusion images with minimal soft tissue attenuation   Diastolic dysfunction    a. echo 2010: EF 60-65%, GR1DD, no AI, trivial MR, LA nl   GERD (gastroesophageal reflux disease)    Glaucoma    Hyperglycemia    Hyperlipidemia    Hypertension     Past Surgical History:  Procedure Laterality Date   ESOPHAGOGASTRODUODENOSCOPY (EGD) WITH PROPOFOL N/A 05/25/2022   Procedure: ESOPHAGOGASTRODUODENOSCOPY (EGD) WITH PROPOFOL;  Surgeon: Toney Reil, MD;  Location: [facility] ENDOSCOPY;  Service: Gastroenterology;  Laterality: N/A;  RIDE IS ABOUT 20 MINUTES OUT   TONSILLECTOMY  Family History  Problem Relation Age of Onset   Uterine cancer Mother    Heart attack Father    Pulmonary embolism Brother    Hepatitis Brother    Coronary artery disease Brother    Coronary artery disease Other        Aunt              Social History:  reports that she quit smoking about 44 years ago. Her smoking use included cigarettes. She has a 2.50 pack-year smoking history. She has never used smokeless tobacco. She reports that she does not drink alcohol and does not use drugs.  Allergies  Allergen Reactions   Penicillins Hives    MEDICATIONS:                                                                                                                     Prior to Admission:  Medications Prior to Admission  Medication Sig Dispense Refill Last Dose   albuterol (PROAIR HFA) 108 (90 Base) MCG/ACT inhaler Inhale 2 puffs into the lungs every 6 (six) hours as needed. Wheezing or shortness of breath. 18 g 10 prn at unk   amLODipine (NORVASC) 5 MG tablet Take 1 tablet (5 mg total) by mouth daily. 90 tablet 1 01/15/2023   aspirin 81 MG tablet Take 81 mg by mouth daily.   01/15/2023   Calcium Carbonate-Vitamin D 600-200 MG-UNIT TABS Take by mouth.   01/14/2023   dorzolamide (TRUSOPT) 2 % ophthalmic solution Place 1 drop into both eyes 2 (two) times daily.   01/14/2023    escitalopram (LEXAPRO) 10 MG tablet Take 1 tablet (10 mg total) by mouth daily. 90 tablet 1 01/15/2023   fluticasone (FLONASE) 50 MCG/ACT nasal spray Place 2 sprays into both nostrils daily. 16 g 6 prn at unk   fluticasone-salmeterol (ADVAIR) 250-50 MCG/ACT AEPB Inhale 1 puff into the lungs in the morning and at bedtime. 180 each 1 prn at unk   metoprolol succinate (TOPROL-XL) 25 MG 24 hr tablet Take 1 tablet (25 mg total) by mouth daily. 90 tablet 1 01/14/2023   Multiple Vitamins-Minerals (ONE-A-DAY WOMENS 50 PLUS PO) Take by mouth daily.   01/14/2023   nitroGLYCERIN (NITROSTAT) 0.4 MG SL tablet Place 1 tablet (0.4 mg total) under the tongue every 5 (five) minutes as needed for chest pain. 30 tablet 1 prn at unk   Omega-3 Fatty Acids (FISH OIL) 1000 MG CAPS 1,000 mg. Take two tablets by mouth daily.   01/14/2023   pantoprazole (PROTONIX) 40 MG tablet Take 1 tablet (40 mg total) by mouth daily. 30 tablet 1 01/15/2023   simvastatin (ZOCOR) 40 MG tablet Take 1 tablet (40 mg total) by mouth daily. 90 tablet 1 01/15/2023   VYZULTA 0.024 % SOLN Place 1 drop into both eyes every evening.   01/14/2023   benzonatate (TESSALON) 100 MG capsule Take 1 capsule (100 mg total) by mouth 2 (two) times daily as needed for cough. (Patient not taking: Reported on 01/15/2023)  20 capsule 0 Not Taking   doxycycline (VIBRA-TABS) 100 MG tablet Take 1 tablet (100 mg total) by mouth 2 (two) times daily for 5 days. (Patient not taking: Reported on 01/15/2023) 10 tablet 0 Not Taking   predniSONE (DELTASONE) 20 MG tablet Take 2 tablets (40 mg total) by mouth daily with breakfast for 5 days. (Patient not taking: Reported on 01/15/2023) 10 tablet 0 Not Taking   Scheduled:  aspirin  81 mg Oral Daily   doxycycline  100 mg Oral BID   enoxaparin (LOVENOX) injection  90 mg Subcutaneous Q24H   escitalopram  10 mg Oral Daily   mometasone-formoterol  2 puff Inhalation BID   multivitamin with minerals   Oral Daily   pantoprazole  40 mg Oral  Daily   simvastatin  40 mg Oral q1800    ROS:                                                                                                                                       As per HPI. Does not endorse any additional symptoms.    Blood pressure (!) 95/50, pulse 73, temperature 98.7 F (37.1 C), resp. rate 16, SpO2 96 %.   General Examination:                                                                                                       Physical Exam HEENT- [place]/AT   Lungs- Respirations unlabored Extremities- Warm and well-perfused  Neurological Examination Mental Status: Awake and alert. Fully oriented. Thought content appropriate.  Speech fluent without evidence of aphasia.  Able to follow all commands without difficulty. Cranial Nerves: II: Temporal visual fields intact with no extinction to DSS. PERRL. III,IV, VI: No ptosis. EOMI. No nystagmus. V: Temp sensation equal bilaterally VII: Smile symmetric VIII: Hearing intact to voice IX,X: No hypophonia or hoarseness XI: Symmetric XII: Midline tongue extension Motor: RUE: 5/5 RLE: 5/5 LUE: 5/5 LLE: 5/5 Sensory: Temp and FT intact x 4. No extinction to DSS. Deep Tendon Reflexes: 1+ and symmetric bilateral biceps and brachioradialis. Unelicitable patellar and achilles reflexes.  Cerebellar: No ataxia with FNF or H-S bilaterally Gait: Deferred    Lab Results: Basic Metabolic Panel: Recent Labs  Lab 01/15/23 1011  NA 139  K 3.8  CL 107  CO2 23  GLUCOSE 169*  BUN 15  CREATININE 0.82  CALCIUM 9.0    CBC: Recent Labs  Lab 01/15/23 1011  WBC 7.2  HGB 14.8  HCT  44.6  MCV 88.8  PLT 218    Cardiac Enzymes: No results for input(s): "CKTOTAL", "CKMB", "CKMBINDEX", "TROPONINI" in the last 168 hours.  Lipid Panel: Recent Labs  Lab 01/16/23 0441  CHOL 146  TRIG 102  HDL 47  CHOLHDL 3.1  VLDL 20  LDLCALC 79    Imaging: MR Brain W and Wo Contrast  Result Date: 01/15/2023 CLINICAL DATA:   Neuro deficit with acute stroke suspected EXAM: MRI HEAD WITHOUT AND WITH CONTRAST TECHNIQUE: Multiplanar, multiecho pulse sequences of the brain and surrounding structures were obtained without and with intravenous contrast. CONTRAST:  9mL GADAVIST GADOBUTROL 1 MMOL/ML IV SOLN COMPARISON:  CT/A of the head neck from earlier today FINDINGS: Brain: No acute infarction, hemorrhage, hydrocephalus, extra-axial collection or mass lesion. Mild cerebral volume loss and chronic small vessel ischemic type change for age. No abnormal enhancement. Vascular: Normal flow voids. Skull and upper cervical spine: Hypointense area in the right parietal bone which has a benign appearance by prior CT. Sinuses/Orbits: Bilateral cataract resection. IMPRESSION: No acute finding.  Unremarkable brain MRI for age. Electronically Signed   By: Tiburcio Pea M.D.   On: 01/15/2023 22:13   CT Angio Head Neck W WO CM  Result Date: 01/15/2023 CLINICAL DATA:  Provided history: Vertigo, central. Additional history provided: Dizziness. Fall this morning. Episode of vomiting. EXAM: CT ANGIOGRAPHY HEAD AND NECK WITH AND WITHOUT CONTRAST TECHNIQUE: Multidetector CT imaging of the head and neck was performed using the standard protocol during bolus administration of intravenous contrast. Multiplanar CT image reconstructions and MIPs were obtained to evaluate the vascular anatomy. Carotid stenosis measurements (when applicable) are obtained utilizing NASCET criteria, using the distal internal carotid diameter as the denominator. RADIATION DOSE REDUCTION: This exam was performed according to the departmental dose-optimization program which includes automated exposure control, adjustment of the mA and/or kV according to patient size and/or use of iterative reconstruction technique. CONTRAST:  75mL OMNIPAQUE IOHEXOL 350 MG/ML SOLN COMPARISON:  Head CT 08/19/2010. MRA head 06/10/2008. FINDINGS: CT HEAD FINDINGS Brain: No age advanced or lobar predominant  parenchymal atrophy. There is no acute intracranial hemorrhage. No demarcated cortical infarct. No extra-axial fluid collection. No evidence of an intracranial mass. No midline shift. Vascular: No hyperdense vessel. Atherosclerotic calcifications. Skull: No fracture or aggressive osseous lesion. Sinuses/Orbits: No orbital mass or acute orbital finding. No significant paranasal sinus disease. Review of the MIP images confirms the above findings CTA NECK FINDINGS Aortic arch: Standard aortic branching. Atherosclerotic plaque within the visualized aortic arch and proximal major branch vessels of the neck. Streak and beam hardening artifact arising from a dense right-sided contrast bolus partially obscures the right subclavian artery. Within this limitation, there is no appreciable hemodynamically significant innominate or proximal subclavian artery stenosis. Right carotid system: CCA and ICA patent within the neck without hemodynamically significant stenosis (50% or greater). Mild sclerotic plaque within the proximal CCA, about the carotid bifurcation and within the proximal ICA. Left carotid system: CCA and ICA patent within the neck without hemodynamically significant stenosis (50% or greater). Mild atherosclerotic plaque at the CCA origin, about the carotid bifurcation and within the proximal ICA. Vertebral arteries: The vertebral arteries are patent within the neck. Calcified plaque at the origin of the right vertebral artery likely resulting in at least moderate stenosis. Venous reflux from a dense right-sided contrast bolus limits evaluation of the V1 and proximal V2 right vertebral artery. Within this limitation, no hemodynamically significant stenosis is identified elsewhere within the cervical right vertebral artery. Atherosclerotic  plaque within the proximal V1 left vertebral artery resulting in moderate stenosis. Nonstenotic atherosclerotic plaque within the left vertebral artery at the V2 left vertebral  artery at the C1 level. The left vertebral artery is dominant. Skeleton: Cervical spondylosis. No acute fracture or aggressive osseous lesion. Other neck: Subcentimeter calcified nodule within the left thyroid lobe not meeting consensus criteria for ultrasound follow-up based on size. No follow-up imaging recommended. Reference: J Am Coll Radiol. 2015 Feb;12(2): 143-50. Upper chest: No consolidation within the imaged lung apices. Review of the MIP images confirms the above findings CTA HEAD FINDINGS Anterior circulation: The intracranial internal carotid arteries are patent. Calcified atherosclerotic plaque within both vessels with no more than mild stenosis. The M1 middle cerebral arteries are patent. Atherosclerotic irregularity of the M2 and more distal MCA vessels bilaterally. No M2 proximal branch occlusion or high-grade proximal stenosis. The anterior cerebral arteries are patent. No intracranial aneurysm is identified. Posterior circulation: The intracranial vertebral arteries are patent. The basilar artery is patent. The right posterior inferior cerebellar artery and left anterior inferior cerebellar artery are poorly delineated. The posterior cerebral arteries are patent. A left posterior communicating artery is present. The right posterior communicating artery is diminutive or absent. Venous sinuses: The dural venous sinuses are poorly assessed due to contrast timing. Anatomic variants: As described. Review of the MIP images confirms the above findings IMPRESSION: Non-contrast head CT: No evidence of an acute intracranial abnormality. CTA neck: 1. The common carotid and internal carotid arteries are patent within the neck without hemodynamically significant stenosis. Atherosclerotic plaque bilaterally, as described. 2. Vertebral arteries patent within the neck. Venous reflux from a dense right-sided contrast bolus limits evaluation of the V1 and proximal V2 right vertebral artery. Atherosclerotic plaque at  the origin of the right vertebral artery, likely resulting in at least moderate stenosis. Moderate atherosclerotic narrowing at the origin of the left vertebral artery. 3.  Aortic Atherosclerosis (ICD10-I70.0). CTA head: 1. The right posterior inferior cerebellar artery and left anterior inferior cerebellar artery are poorly delineated. This may be due to developmentally small vessel size. However, vessel occlusions at these sites cannot be excluded. Consider a brain MRI for further evaluation. 2. No intracranial large vessel occlusion or proximal high-grade arterial stenosis identified elsewhere. 3. Intracranial atherosclerotic disease, as described. Electronically Signed   By: Jackey Loge D.O.   On: 01/15/2023 15:32   DG Chest Portable 1 View  Result Date: 01/15/2023 CLINICAL DATA:  Fall, dizziness EXAM: PORTABLE CHEST 1 VIEW COMPARISON:  Portable exam 1430 hours compared to 05/17/2022 FINDINGS: Normal heart size, mediastinal contours, and pulmonary vascularity. Lungs clear. No pulmonary infiltrate, pleural effusion, or pneumothorax. No acute osseous findings. IMPRESSION: No acute abnormalities. Electronically Signed   By: Ulyses Southward M.D.   On: 01/15/2023 14:29   DG Humerus Left  Result Date: 01/15/2023 CLINICAL DATA:  Fall EXAM: LEFT HUMERUS - 2 VIEW COMPARISON:  None Available. FINDINGS: There is no evidence of fracture or other focal bone lesions. Soft tissues are unremarkable. IMPRESSION: Negative. Electronically Signed   By: Wiliam Ke M.D.   On: 01/15/2023 14:28     Assessment: 77 year old female presenting with acute vertigo after a fall - Exam is nonfocal.  - MRI brain: No acute infarction. Mild cerebral volume loss and chronic small vessel ischemic type change for age. No abnormal enhancement.  - CTA neck: The common carotid and internal carotid arteries are patent within the neck. Atherosclerotic plaque is present bilaterally, without hemodynamically significant stenosis. Vertebral  arteries patent within the neck. Venous reflux from a dense right-sided contrast bolus limits evaluation of the V1 and proximal V2 right vertebral artery. Atherosclerotic plaque at the origin of the right vertebral artery, likely resulting in at least moderate stenosis. Moderate atherosclerotic narrowing at the origin of the left vertebral artery. Aortic Atherosclerosis  - CTA head: Findings consistent with intracranial atherosclerotic disease. The right posterior inferior cerebellar artery and left anterior inferior cerebellar artery are poorly delineated. This may be due to developmentally small vessel size. However, vessel occlusions at these sites cannot be excluded. No intracranial large vessel occlusion or proximal high-grade arterial stenosis identified elsewhere.  - DDx for the patient's vertigo:  - Overall presentation felt to be most consistent with peripheral vertigo due to BPPV secondary to loosening of otoliths in her vestibular system in the context of jostling during her fall.  - Vestibular neuritis is also possible but felt to be significantly less likely given lack of abnormal CN VIII enhancement on MRI.   - Central vertigo due to a stroke or other brain lesion has been ruled out by MRI.   Recommendations: - Outpatient ENT follow up for peripheral vertigo, most likely BPPV.  - No further recommendations from a neurology standpoint.  - Neurohospitalist service will sign off. Please call if there are additional questions.    Electronically signed: Dr. Caryl Pina 01/16/2023, 9:12 AM

## 2023-01-16 NOTE — Evaluation (Addendum)
Occupational Therapy Evaluation Patient Details Name: Kellie Phelps MRN: [id number] DOB: 1946/08/13 Today's Date: 01/16/2023   History of Present Illness [name] is a 77 year old female with history of hypertension, hyperlipidemia, CAD, history of COPD follows with [facility], GERD, anxiety, who presents emergency department from home for chief concerns of falling and increased weakness.   Clinical Impression   Patient agreeable to OT evaluation. Pt presenting with decreased independence in self care, balance, functional mobility/transfers, and endurance. PTA pt lived alone, was independent for ADLs/IADLs, and independent for functional mobility without an AD. Pt does not drive. Pt endorsed dizziness at rest. Symptoms worsened with turning head in bed and upon sitting up at EOB. Negative for orthostatics. Pt currently functioning at [facility] for bed mobility, CGA for toilet transfer, and CGA-Min A for functional mobility to the bathroom without an AD (deferred use of RW). Pt will benefit from skilled acute OT services to address deficits noted below. OT recommends ongoing therapy upon discharge to maximize safety and independence with ADLs, decrease fall risk, decrease caregiver burden, and promote return to PLOF.     Recommendations for follow up therapy are one component of a multi-disciplinary discharge planning process, led by the attending physician.  Recommendations may be updated based on patient status, additional functional criteria and insurance authorization.   Assistance Recommended at Discharge Intermittent Supervision/Assistance   Patient can return home with the following A little help with walking and/or transfers;A little help with bathing/dressing/bathroom;Assistance with cooking/housework;Assist for transportation;Help with stairs or ramp for entrance    Functional Status Assessment  Patient has had a recent decline in their functional status and  demonstrates the ability to make significant improvements in function in a reasonable and predictable amount of time.  Equipment Recommendations  None recommended by OT    Recommendations for Other Services       Precautions / Restrictions Precautions Precautions: Fall Restrictions Weight Bearing Restrictions: No      Mobility Bed Mobility Overal bed mobility: Needs Assistance Bed Mobility: Sidelying to Sit, Sit to Sidelying   Sidelying to sit: Min guard     Sit to sidelying: Min guard      Transfers Overall transfer level: Needs assistance Equipment used: None Transfers: Sit to/from Stand Sit to Stand: Min guard                  Balance Overall balance assessment: Needs assistance Sitting-balance support: Feet supported Sitting balance-Leahy Scale: Good     Standing balance support: No upper extremity supported, Single extremity supported, During functional activity Standing balance-Leahy Scale: Fair                             ADL either performed or assessed with clinical judgement   ADL Overall ADL's : Needs assistance/impaired     Grooming: Min guard;Standing;Wash/dry hands Grooming Details (indicate cue type and reason): CGA for safety 2/2 unsteadiness         Upper Body Dressing : Sitting;Set up Upper Body Dressing Details (indicate cue type and reason): to don/doff gown  Lower Body Dressing: Sitting/lateral leans;Sit to/from stand Lower Body Dressing Details (indicate cue type and reason): pt able to thread B feet through underwear in sitting with set up-supervision, CGA for safety to hike underwear in standing  Toilet Transfer: Min guard;Ambulation;Regular Toilet;Grab bars           Functional mobility during ADLs: Min guard;Minimal assistance (~66ft to the bathroom  without an AD, pt deferred use of RW 2/2 not needing one at baseline. CGA for safety. Pt slightly unsteady due to dizziness, several minor LOB requring Min A to  correct)       Vision Baseline Vision/History: 1 Wears glasses (readers only) Patient Visual Report: No change from baseline       Perception     Praxis      Pertinent Vitals/Pain Pain Assessment Pain Assessment: No/denies pain     Hand Dominance     Extremity/Trunk Assessment Upper Extremity Assessment Upper Extremity Assessment: Generalized weakness   Lower Extremity Assessment Lower Extremity Assessment: Generalized weakness       Communication Communication Communication: No difficulties   Cognition Arousal/Alertness: Awake/alert Behavior During Therapy: WFL for tasks assessed/performed Overall Cognitive Status: Within Functional Limits for tasks assessed         General Comments  Pt endorsed dizziness at rest. Symptoms worsened with turning head in bed and upon sitting up at EOB. Negative for orthostatics. Supine: 105/62 (75), sitting: 116/76 (89), standing: 124/92 (103).    Exercises Other Exercises Other Exercises: OT provided education re: role of OT, OT POC, post acute recs, sitting up for all meals, EOB/OOB mobility with assistance, home/fall safety, safety awareness during mobility due to dizziness, taking rest breaks   Shoulder Instructions      Home Living Family/patient expects to be discharged to:: Private residence Living Arrangements: Alone Available Help at Discharge: Family;Available PRN/intermittently Type of Home: Apartment Home Access: Level entry     Home Layout: One level     Bathroom Shower/Tub: Producer, television/film/video: Standard     Home Equipment: Shower seat - built in;Grab bars - tub/shower          Prior Functioning/Environment Prior Level of Function : Independent/Modified Independent;History of Falls (last six months)             Mobility Comments: Ambulatory no AD, 1 recent fall ADLs Comments: Pt reports IND for ADLs/IADLs. Pt does not drive. Niece provides transportation to grocery store, has  medical transport to appointments.        OT Problem List: Decreased strength;Decreased activity tolerance;Impaired balance (sitting and/or standing);Decreased knowledge of use of DME or AE;Decreased knowledge of precautions      OT Treatment/Interventions: Self-care/ADL training;Therapeutic exercise;Energy conservation;DME and/or AE instruction;Therapeutic activities;Patient/family education;Balance training    OT Goals(Current goals can be found in the care plan section) Acute Rehab OT Goals Patient Stated Goal: return home, dizziness to improve OT Goal Formulation: With patient Time For Goal Achievement: 01/30/23 Potential to Achieve Goals: Good   OT Frequency: Min 1X/week    Co-evaluation              AM-PAC OT "6 Clicks" Daily Activity     Outcome Measure Help from another person eating meals?: None Help from another person taking care of personal grooming?: A Little Help from another person toileting, which includes using toliet, bedpan, or urinal?: A Little Help from another person bathing (including washing, rinsing, drying)?: A Little Help from another person to put on and taking off regular upper body clothing?: None Help from another person to put on and taking off regular lower body clothing?: A Little 6 Click Score: 20   End of Session Equipment Utilized During Treatment: Gait belt Nurse Communication: Mobility status;Other (comment) (dizziness (negative for orthostatics), NT notified pt had continent void on toilet (collected urine sample))  Activity Tolerance: Patient tolerated treatment well;Other (comment) (dizzy) Patient left:  in bed;with call bell/phone within reach;with bed alarm set  OT Visit Diagnosis: Dizziness and giddiness (R42);History of falling (Z91.81);Muscle weakness (generalized) (M62.81);Unsteadiness on feet (R26.81)                Time: 4098-1191 OT Time Calculation (min): 30 min Charges:  OT General Charges $OT Visit: 1 Visit OT  Evaluation $OT Eval Low Complexity: 1 Low  [facility] MS, OTR/L ascom [phone]  01/16/23, 3:14 PM

## 2023-01-16 NOTE — Evaluation (Signed)
Physical Therapy Evaluation Patient Details Name: Kellie Phelps MRN: [id number] DOB: 10-May-1946 Today's Date: 01/16/2023  History of Present Illness  [name] is a 77 year old female with history of hypertension, hyperlipidemia, CAD, history of COPD follows with [place] pul[facility]ology, GERD, anxiety, who presents emergency department from home for chief concerns of falling and increased weakness.   Clinical Impression  Patient alert, agreeable to PT assessment. Session focused on vestibular assessment and treatment. At baseline she is independent. She was able to perform all bed mobility with CGA, but physical assistance needed for canalith repositioning. Overall the patient demonstrated decreased symptoms and nystagmus with Epley but would benefit from ongoing assessment and intervention. She would benefit from further skilled PT services to maximize function, safety, and independence to return to PLOF.  Screening questions: NEGATIVE  Subjective history of current problem: Pt reported that she was on the way to the bathroom, missed the toilet and fell on the floor, experienced dizziness that lasted for hours, and still somewhat present at rest during session.  exacerbating factors: head movements, ambulation, mobility  Relieving factors: rest  Symptom duration: ongoing, worsens with movement  Symptom frequency: constant  Description of symptoms, description of dizziness: room spinning  Symptom nature (motion provoked, position dependent, spontaneous), improvement since onset: improved since onset, motion provoked and position dependent, no spontaneous nystagmus noted.   BPPV TESTS:  Symptoms Duration Intensity Nystagmus  Left Dix-Hallpike "Dizzy" "room spinning" 20 seconds 8/10 yes  Right Dix-Hallpike "Dizzy" 15 seconds Less than 8/10 yes  Left Head Roll "Room spinning" most challenging for pt 20 seconds 9/10 yes  Right Head Roll negative              Recommendations for follow up therapy are one component of a multi-disciplinary discharge planning process, led by the attending physician.  Recommendations may be updated based on patient status, additional functional criteria and insurance authorization.  Follow Up Recommendations       Assistance Recommended at Discharge Intermittent Supervision/Assistance  Patient can return home with the following  Assist for transportation;Assistance with cooking/housework;Help with stairs or ramp for entrance;A little help with bathing/dressing/bathroom    Equipment Recommendations Rolling walker (2 wheels)  Recommendations for Other Services       Functional Status Assessment Patient has had a recent decline in their functional status and demonstrates the ability to make significant improvements in function in a reasonable and predictable amount of time.     Precautions / Restrictions Precautions Precautions: Fall Restrictions Weight Bearing Restrictions: No      Mobility  Bed Mobility Overal bed mobility: Needs Assistance Bed Mobility: Sidelying to Sit, Supine to Sit, Sit to Sidelying, Sit to Supine   Sidelying to sit: Min guard Supine to sit: Min guard Sit to supine: Min guard Sit to sidelying: Min guard General bed mobility comments: physical assistance as needed for BPPV manuevers    Transfers Overall transfer level: Needs assistance Equipment used: 1 person hand held assist Transfers: Sit to/from Stand Sit to Stand: Min guard                Ambulation/Gait Ambulation/Gait assistance: Land (Feet): 1 Feet              Stairs            Wheelchair Mobility    Modified Rankin (Stroke Patients Only)       Balance Overall balance assessment: Needs assistance Sitting-balance support: Feet supported Sitting balance-Leahy Scale: Good  Standing balance support: No upper extremity supported, Single extremity supported, During  functional activity Standing balance-Leahy Scale: Fair                               Pertinent Vitals/Pain Pain Assessment Pain Assessment: No/denies pain    Home Living Family/patient expects to be discharged to:: Private residence Living Arrangements: Alone Available Help at Discharge: Family;Available PRN/intermittently Type of Home: Apartment Home Access: Level entry       Home Layout: One level Home Equipment: Shower seat - built in;Grab bars - tub/shower      Prior Function Prior Level of Function : Independent/Modified Independent;History of Falls (last six months)             Mobility Comments: Ambulatory no AD, 1 recent fall ADLs Comments: Pt reports IND for ADLs/IADLs. Pt does not drive. Niece provides transportation to grocery store, has medical transport to appointments.     Hand Dominance        Extremity/Trunk Assessment   Upper Extremity Assessment Upper Extremity Assessment: Generalized weakness    Lower Extremity Assessment Lower Extremity Assessment: Generalized weakness       Communication   Communication: No difficulties  Cognition Arousal/Alertness: Awake/alert Behavior During Therapy: WFL for tasks assessed/performed Overall Cognitive Status: Within Functional Limits for tasks assessed                                          General Comments General comments (skin integrity, edema, etc.): Pt endorsed dizziness at rest. Symptoms worsened with turning head in bed and upon sitting up at EOB. Negative for orthostatics. Supine: 105/62 (75), sitting: 116/76 (89), standing: 124/92 (103).    Exercises Other Exercises Other Exercises: Gilberto Better, epley x2   Assessment/Plan    PT Assessment Patient needs continued PT services  PT Problem List Decreased strength;Decreased mobility;Decreased range of motion;Decreased activity tolerance;Decreased balance       PT Treatment Interventions DME  instruction;Therapeutic activities;Gait training;Therapeutic exercise;Patient/family education;Stair training;Balance training;Functional mobility training;Neuromuscular re-education    PT Goals (Current goals can be found in the Care Plan section)  Acute Rehab PT Goals Patient Stated Goal: to not be dizzy PT Goal Formulation: With patient Time For Goal Achievement: 01/30/23 Potential to Achieve Goals: Good    Frequency Min 4X/week     Co-evaluation               AM-PAC PT "6 Clicks" Mobility  Outcome Measure Help needed turning from your back to your side while in a flat bed without using bedrails?: A Little Help needed moving from lying on your back to sitting on the side of a flat bed without using bedrails?: A Little Help needed moving to and from a bed to a chair (including a wheelchair)?: A Little Help needed standing up from a chair using your arms (e.g., wheelchair or bedside chair)?: A Little Help needed to walk in hospital room?: A Little Help needed climbing 3-5 steps with a railing? : A Little 6 Click Score: 18    End of Session   Activity Tolerance: Patient tolerated treatment well Patient left: in bed;with call bell/phone within reach;with bed alarm set Nurse Communication: Mobility status PT Visit Diagnosis: Other abnormalities of gait and mobility (R26.89);BPPV;Dizziness and giddiness (R42) BPPV - Right/Left : Left    Time: 2956-2130 PT Time  Calculation (min) (ACUTE ONLY): 35 min   Charges:   PT Evaluation $PT Eval Low Complexity: 1 Low PT Treatments $Canalith Rep Proc: 23-37 mins        Olga Coaster PT, DPT 3:52 PM,01/16/23

## 2023-01-16 NOTE — Progress Notes (Signed)
Patient transported to MRI @ 2100. Returns to room 104 @ 2150. Patient assessed and denies needs at this time.

## 2023-01-16 NOTE — Progress Notes (Signed)
[facility] Progress Note [name] QIO:[id number] DOB: [birth year] DOA: 01/15/2023  DOS: the patient was seen and examined on 01/16/2023  Brief hospital course: PMH of HTN, HLD, CAD, COPD, GERD, anxiety presented to hospital with complaints of fall followed by vertigo. She was at home and had a fall in her bathroom hitting her left side.  After which she started having severe vertigo.  After considerable effort she finally got her back in bed and slept.  Then she called 911 when she woke up at the request of her niece and while trying to open up her door had another fall. Patient not able to ambulate after trial in the emergency department. CT head negative for acute stroke or bleeding or trauma. CTA negative for any large vessel occlusion. MRI brain with and without contrast negative for any acute stroke.  No evidence of concussion so far.  Concern for BPPV. Assessment and Plan: BPPV. Presents with dizziness. On examination symptoms are described as room spinning sensation when she is moving her head. Nystagmus present on examination. CT head MRI brain with and without contrast negative for any acute intracranial abnormality. No focal deficit at the time of my evaluation as well. Neurology was briefly consulted but not formally seeing the patient. PT OT vestibular rehab requested and recommended home health vestibular rehab for the patient.  Hypotension. Essential hypertension. On metoprolol and amlodipine at home. Blood pressure soft for the orthostatic vitals are negative. Will give IV fluid and stop antihypertensive medications.  History of CAD. PAD Currently no evidence of acute ischemia. CTA shows moderate vertebral stenosis. Continue aspirin.  HLD. Continue simvastatin.  Concern for TIA or stroke-ruled out. No further workup necessary.  Recent sinusitis. Patient was initially treated with doxycycline for sinusitis. Was represcribed this  medication on April 26. At present no indication to continue further antibiotic therapy. Will use Flonase.  GERD. Continue PPI.  Mood disorder. On Lexapro at home. Which I will continue.  Subjective: No nausea or vomiting.  Still has some dizziness but improving.  No fever no chills.  Telemetry shows evidence of some PVCs.  Physical Exam: General: in Mild distress, No Rash Cardiovascular: S1 and S2 Present, No Murmur Respiratory: Good respiratory effort, Bilateral Air entry present. No Crackles, No wheezes Abdomen: Bowel Sound present, No tenderness Extremities: No edema Neuro: Alert and oriented x3, no new focal deficit  Data Reviewed: I have Reviewed nursing notes, Vitals, and Lab results. Since last encounter, pertinent lab results CBC and BMP   . I have ordered test including CBC and BMP  .   Disposition: Status is: Observation Patient will require IV fluids overnight.  Patient will also require improvement in her symptoms so that she can ambulate without a fall.  SCD's Start: 01/15/23 1603   Family Communication: Discussed with niece on the phone Level of care: Telemetry Medical continue due to PVC and hypertension Vitals:   01/16/23 0107 01/16/23 0433 01/16/23 0742 01/16/23 1137  BP: (!) 93/56 107/75 (!) 95/50 105/62  Pulse: 66 (!) 58 73 68  Resp: 18 17 16 18   Temp: 97.8 F (36.6 C) 97.9 F (36.6 C) 98.7 F (37.1 C) 98.4 F (36.9 C)  TempSrc: Oral Oral    SpO2: 96% 95% 96% 95%     Author: Lynden Oxford, MD 01/16/2023 4:03 PM  Please look on [email or web address] to find out who is on call.

## 2023-01-16 NOTE — Telephone Encounter (Signed)
Lvm for pt to schedule appt. Pt already have appt sch'd for 5.10.2024 so she can keep that appt if she is willing to wait

## 2023-01-16 NOTE — Telephone Encounter (Signed)
Needs appt never talked about incontinence and not on dx list

## 2023-01-17 ENCOUNTER — Telehealth: Payer: Self-pay | Admitting: Internal Medicine

## 2023-01-17 DIAGNOSIS — H8111 Benign paroxysmal vertigo, right ear: Secondary | ICD-10-CM

## 2023-01-17 DIAGNOSIS — R42 Dizziness and giddiness: Secondary | ICD-10-CM | POA: Diagnosis not present

## 2023-01-17 DIAGNOSIS — H8110 Benign paroxysmal vertigo, unspecified ear: Secondary | ICD-10-CM | POA: Diagnosis present

## 2023-01-17 MED ORDER — MECLIZINE HCL 25 MG PO TABS: 25.0000 mg | ORAL_TABLET | Freq: Three times a day (TID) | ORAL | 0 refills | Status: DC

## 2023-01-17 NOTE — Care Management Obs Status (Signed)
MEDICARE OBSERVATION STATUS NOTIFICATION   Patient Details  Name: Kellie Phelps MRN: [id number] Date of Birth: Jul 23, 1946   Medicare Observation Status Notification Given:  Yes    Maysin Carstens, LCSW 01/17/2023, 9:01 AM

## 2023-01-17 NOTE — Telephone Encounter (Signed)
[name] with [facility] if Dr. Margarita Mail follow for home health orders.  CB#  [phone]

## 2023-01-17 NOTE — Progress Notes (Signed)
Physical Therapy Treatment Patient Details Name: Kellie Phelps MRN: [id number] DOB: 01-16-1946 Today's Date: 01/17/2023   History of Present Illness [name] is a 77 year old female with history of hypertension, hyperlipidemia, CAD, history of COPD follows with [place] pulmonology, GERD, anxiety, who presents emergency department from home for chief concerns of falling and increased weakness.    PT Comments    Patient is supine in bed upon arrival, agreeable to PT session. Patient reports she is feeling better, but still continues to have dizziness/vertigo episodes with bed mobility and when ambulating to bathroom earlier this AM. With assessment patient demonstrated R Upbeating Nystagmus with R Dix Hallpike and R Sidelying test. Treated with R Epley with resolution of symptoms and no nystagmus noted. Patient demonstrated some unsteadiness with mobility after treatment, educated on importance of AD use at this time to promote safety and reduce fall risk. Patient will benefit from ongoing assessment and intervention while admitted due to rate of reouccrence of BPPV. Patient left in bed with bed alarm set and all needs met. Pt will continue to benefit from skilled acute care PT services to return to PLOF.    Recommendations for follow up therapy are one component of a multi-disciplinary discharge planning process, led by the attending physician.  Recommendations may be updated based on patient status, additional functional criteria and insurance authorization.  Follow Up Recommendations       Assistance Recommended at Discharge Intermittent Supervision/Assistance  Patient can return home with the following Assist for transportation;Assistance with cooking/housework;Help with stairs or ramp for entrance;A little help with bathing/dressing/bathroom   Equipment Recommendations  Rolling walker (2 wheels)    Recommendations for Other Services       Precautions /  Restrictions Precautions Precautions: Fall Restrictions Weight Bearing Restrictions: No    Vestibular Asssessment/Treatment    Symptom Behavior:   Type of dizziness: Imbalance (Disequilibrium), Spinning/Vertigo, and Lightheadedness/Faint   Frequency: with bed mobility   Duration: seconds   Aggravating factors: Induced by position change: supine to sit   Relieving factors: no known relieving factors   Progression of symptoms: better   Oculomotor Exam:   Ocular Alignment: normal   Ocular ROM: No Limitations   Spontaneous Nystagmus: absent   Gaze-Induced Nystagmus: absent   Smooth Pursuits: intact   Saccades: intact     Positional Testing: Right Dix-Hallpike: upbeating, right nystagmus and Duration: 5 seconds Left Dix-Hallpike: no nystagmus Right Roll Test: no nystagmus Left Roll Test: no nystagmus Right Sidelying: upbeating, right nystagmus and Duration: 15 seconds Left Sidelying: no nystagmus    Vestibular Treatment   Canalith Repositioning:   Epley Right: Number of Reps: 1, Response to Treatment: symptoms resolved, and Comment: upon reassessment of R Dix Hallpike no nystagmus/symptoms reported. Therefore no further BPPV treatment warranted. Will benefit from further assessment and treatment while patient admitted.          Mobility  Bed Mobility Overal bed mobility: Needs Assistance Bed Mobility: Sidelying to Sit, Supine to Sit, Sit to Sidelying, Sit to Supine   Sidelying to sit: Min guard Supine to sit: Min guard Sit to supine: Min guard Sit to sidelying: Min guard General bed mobility comments: physical assistance as needed for BPPV manuevers; increased assistance from supine > long sit 2/2 history of sciatica    Transfers Overall transfer level: Needs assistance Equipment used: 1 person hand held assist Transfers: Sit to/from Stand Sit to Stand: Min guard           General transfer comment:  sit > stand from EOB to allow for readjustment of bedding     Ambulation/Gait               General Gait Details: not completed this session due to unsteadiness after BPPV treatmetn   Stairs             Wheelchair Mobility    Modified Rankin (Stroke Patients Only)       Balance Overall balance assessment: Needs assistance Sitting-balance support: Feet supported Sitting balance-Leahy Scale: Good     Standing balance support: Single extremity supported Standing balance-Leahy Scale: Fair                              Cognition Arousal/Alertness: Awake/alert Behavior During Therapy: WFL for tasks assessed/performed Overall Cognitive Status: Within Functional Limits for tasks assessed                                          Exercises Other Exercises Other Exercises: PT provided education on BPPV, effects on balance and dysequilibirum and benefits of using RW at this time to reduce fall risk.    General Comments        Pertinent Vitals/Pain Pain Assessment Pain Assessment: No/denies pain    Home Living                          Prior Function            PT Goals (current goals can now be found in the care plan section) Acute Rehab PT Goals Patient Stated Goal: to not be dizzy PT Goal Formulation: With patient Time For Goal Achievement: 01/30/23 Potential to Achieve Goals: Good Progress towards PT goals: Progressing toward goals    Frequency    Min 4X/week      PT Plan Current plan remains appropriate    Co-evaluation              AM-PAC PT "6 Clicks" Mobility   Outcome Measure  Help needed turning from your back to your side while in a flat bed without using bedrails?: A Little Help needed moving from lying on your back to sitting on the side of a flat bed without using bedrails?: A Little Help needed moving to and from a bed to a chair (including a wheelchair)?: A Little Help needed standing up from a chair using your arms (e.g., wheelchair or  bedside chair)?: A Little Help needed to walk in hospital room?: A Little Help needed climbing 3-5 steps with a railing? : A Little 6 Click Score: 18    End of Session   Activity Tolerance: Patient tolerated treatment well Patient left: in bed;with call bell/phone within reach;with bed alarm set Nurse Communication: Mobility status PT Visit Diagnosis: Other abnormalities of gait and mobility (R26.89);BPPV;Dizziness and giddiness (R42) BPPV - Right/Left : Right     Time: 1610-9604 PT Time Calculation (min) (ACUTE ONLY): 40 min  Charges:  $Neuromuscular Re-education: 8-22 mins $Canalith Rep Proc: 8-22 mins                    Creed Copper Fairly, PT, DPT 01/17/23 12:01 PM

## 2023-01-17 NOTE — Progress Notes (Signed)
Patient is not able to walk the distance required to go the bathroom, or he/she is unable to safely negotiate stairs required to access the bathroom.  A 3in1 BSC will alleviate this problem  

## 2023-01-17 NOTE — Progress Notes (Signed)
Received MD order to discharge patient to home with Zio monitoring.  I reviewed discharge instructions, home meds, prescriptions and follow up appointments with patient and patient verbalized understanding

## 2023-01-17 NOTE — Discharge Summary (Signed)
Physician Discharge Summary   [name] MRN: [id number] DOB: 01-26-1946  Admit date:     01/15/2023  Discharge date: 01/17/23  Discharge Physician: Lynden Oxford  PCP: Margarita Mail, DO  Recommendations at discharge: Follow-up with PCP in 1 week.   Follow-up Information     Margarita Mail, DO. Schedule an appointment as soon as possible for a visit in 1 week(s).   Specialty: Internal Medicine Why: patient making own follow up Contact information: [address] [place] [address] [phone]                Discharge Diagnoses: Principal Problem:   BPPV (benign paroxysmal positional vertigo) Active Problems:   Hyperlipidemia   Essential hypertension   COPD (chronic obstructive pulmonary disease) (HCC)   Coronary artery disease, non-occlusive   Anxiety   Weakness  Hospital Course: PMH of HTN, HLD, CAD, COPD, GERD, anxiety presented to hospital with complaints of fall followed by vertigo. She was at home and had a fall in her bathroom hitting her left side.  After which she started having severe vertigo.  After considerable effort she finally got her back in bed and slept.  Then she called 911 when she woke up at the request of her niece and while trying to open up her door had another fall. Patient not able to ambulate after trial in the emergency department. CT head negative for acute stroke or bleeding or trauma. CTA negative for any large vessel occlusion. MRI brain with and without contrast negative for any acute stroke.  No evidence of concussion so far.  Concern for BPPV. Assessment and Plan  BPPV. Presents with dizziness. On examination symptoms are described as room spinning sensation when she is moving her head. Nystagmus present on examination. CT head MRI brain with and without contrast negative for any acute intracranial abnormality. No focal deficit at the time of my evaluation as well. Neurology was  consulted, no stroke seen on MRI. No further workup recommended. PT OT vestibular rehab requested and recommended home health vestibular rehab for the patient.  Patient was able to ambulate 200 feet per OT note with minimal assist.   Hypotension. Essential hypertension. On metoprolol and amlodipine at home. Blood pressure soft for the orthostatic vitals are negative. Treated with IV fluid. Given stability of the blood pressure will be stopping both medications on discharge.   History of CAD. PAD Currently no evidence of acute ischemia. CTA shows moderate vertebral stenosis. Continue aspirin.   HLD. Continue simvastatin.   Concern for TIA or stroke-ruled out. No further workup necessary.   Recent sinusitis. Patient was initially treated with doxycycline for sinusitis. Was represcribed this medication on April 26. At present no indication to continue further antibiotic therapy. Will use Flonase.   GERD. Continue PPI.   Mood disorder. On Lexapro at home. Which I will continue.  Consultants:  Neurology  Procedures performed:  None  DISCHARGE MEDICATION: Allergies as of 01/17/2023       Reactions   Penicillins Hives        Medication List     STOP taking these medications    amLODipine 5 MG tablet Commonly known as: NORVASC   doxycycline 100 MG tablet Commonly known as: VIBRA-TABS   metoprolol succinate 25 MG 24 hr tablet Commonly known as: TOPROL-XL   predniSONE 20 MG tablet Commonly known as: DELTASONE       TAKE these medications    albuterol 108 (90 Base) MCG/ACT inhaler Commonly  known as: ProAir HFA Inhale 2 puffs into the lungs every 6 (six) hours as needed. Wheezing or shortness of breath.   aspirin 81 MG tablet Take 81 mg by mouth daily.   benzonatate 100 MG capsule Commonly known as: TESSALON Take 1 capsule (100 mg total) by mouth 2 (two) times daily as needed for cough.   Calcium Carbonate-Vitamin D 600-200 MG-UNIT Tabs Take by  mouth.   dorzolamide 2 % ophthalmic solution Commonly known as: TRUSOPT Place 1 drop into both eyes 2 (two) times daily.   escitalopram 10 MG tablet Commonly known as: Lexapro Take 1 tablet (10 mg total) by mouth daily.   Fish Oil 1000 MG Caps 1,000 mg. Take two tablets by mouth daily.   fluticasone 50 MCG/ACT nasal spray Commonly known as: FLONASE Place 2 sprays into both nostrils daily.   fluticasone-salmeterol 250-50 MCG/ACT Aepb Commonly known as: ADVAIR Inhale 1 puff into the lungs in the morning and at bedtime.   meclizine 25 MG tablet Commonly known as: ANTIVERT Take 1 tablet (25 mg total) by mouth 3 (three) times daily.   nitroGLYCERIN 0.4 MG SL tablet Commonly known as: NITROSTAT Place 1 tablet (0.4 mg total) under the tongue every 5 (five) minutes as needed for chest pain.   ONE-A-DAY WOMENS 50 PLUS PO Take by mouth daily.   pantoprazole 40 MG tablet Commonly known as: PROTONIX Take 1 tablet (40 mg total) by mouth daily.   simvastatin 40 MG tablet Commonly known as: ZOCOR Take 1 tablet (40 mg total) by mouth daily.   Vyzulta 0.024 % Soln Generic drug: Latanoprostene Bunod Place 1 drop into both eyes every evening.               Durable Medical Equipment  (From admission, onward)           Start     Ordered   01/17/23 1414  For home use only DME 3 n 1  Once        01/17/23 1413   01/17/23 0928  For home use only DME Walker rolling  Once       Question Answer Comment  Walker: With 5 Inch Wheels   Patient needs a walker to treat with the following condition Physical deconditioning      01/17/23 0928           Disposition: Home Diet recommendation: Cardiac diet  Discharge Exam: Vitals:   01/16/23 2025 01/16/23 2350 01/17/23 0441 01/17/23 0821  BP: (!) 109/47 (!) 104/52 113/68 129/75  Pulse: 74 66 60 63  Resp: 18 18 18 18   Temp: 98.2 F (36.8 C) 97.9 F (36.6 C) 98 F (36.7 C) 97.8 F (36.6 C)  TempSrc:      SpO2: 95% 93%  93% 97%   General: Appear in no distress; no visible Abnormal Neck Mass Or lumps, Conjunctiva normal Cardiovascular: S1 and S2 Present, no Murmur, Respiratory: good respiratory effort, Bilateral Air entry present and CTA, no Crackles, no wheezes Abdomen: Bowel Sound present, Non tender  Extremities: no Pedal edema Neurology: alert and oriented to time, place, and person  There were no vitals filed for this visit. Condition at discharge: stable  The results of significant diagnostics from this hospitalization (including imaging, microbiology, ancillary and laboratory) are listed below for reference.   Imaging Studies: MR Brain W and Wo Contrast  Result Date: 01/15/2023 CLINICAL DATA:  Neuro deficit with acute stroke suspected EXAM: MRI HEAD WITHOUT AND WITH CONTRAST TECHNIQUE: Multiplanar, multiecho pulse  sequences of the brain and surrounding structures were obtained without and with intravenous contrast. CONTRAST:  9mL GADAVIST GADOBUTROL 1 MMOL/ML IV SOLN COMPARISON:  CT/A of the head neck from earlier today FINDINGS: Brain: No acute infarction, hemorrhage, hydrocephalus, extra-axial collection or mass lesion. Mild cerebral volume loss and chronic small vessel ischemic type change for age. No abnormal enhancement. Vascular: Normal flow voids. Skull and upper cervical spine: Hypointense area in the right parietal bone which has a benign appearance by prior CT. Sinuses/Orbits: Bilateral cataract resection. IMPRESSION: No acute finding.  Unremarkable brain MRI for age. Electronically Signed   By: Tiburcio Pea M.D.   On: 01/15/2023 22:13   CT Angio Head Neck W WO CM  Result Date: 01/15/2023 CLINICAL DATA:  Provided history: Vertigo, central. Additional history provided: Dizziness. Fall this morning. Episode of vomiting. EXAM: CT ANGIOGRAPHY HEAD AND NECK WITH AND WITHOUT CONTRAST TECHNIQUE: Multidetector CT imaging of the head and neck was performed using the standard protocol during bolus  administration of intravenous contrast. Multiplanar CT image reconstructions and MIPs were obtained to evaluate the vascular anatomy. Carotid stenosis measurements (when applicable) are obtained utilizing NASCET criteria, using the distal internal carotid diameter as the denominator. RADIATION DOSE REDUCTION: This exam was performed according to the departmental dose-optimization program which includes automated exposure control, adjustment of the mA and/or kV according to patient size and/or use of iterative reconstruction technique. CONTRAST:  75mL OMNIPAQUE IOHEXOL 350 MG/ML SOLN COMPARISON:  Head CT 08/19/2010. MRA head 06/10/2008. FINDINGS: CT HEAD FINDINGS Brain: No age advanced or lobar predominant parenchymal atrophy. There is no acute intracranial hemorrhage. No demarcated cortical infarct. No extra-axial fluid collection. No evidence of an intracranial mass. No midline shift. Vascular: No hyperdense vessel. Atherosclerotic calcifications. Skull: No fracture or aggressive osseous lesion. Sinuses/Orbits: No orbital mass or acute orbital finding. No significant paranasal sinus disease. Review of the MIP images confirms the above findings CTA NECK FINDINGS Aortic arch: Standard aortic branching. Atherosclerotic plaque within the visualized aortic arch and proximal major branch vessels of the neck. Streak and beam hardening artifact arising from a dense right-sided contrast bolus partially obscures the right subclavian artery. Within this limitation, there is no appreciable hemodynamically significant innominate or proximal subclavian artery stenosis. Right carotid system: CCA and ICA patent within the neck without hemodynamically significant stenosis (50% or greater). Mild sclerotic plaque within the proximal CCA, about the carotid bifurcation and within the proximal ICA. Left carotid system: CCA and ICA patent within the neck without hemodynamically significant stenosis (50% or greater). Mild atherosclerotic  plaque at the CCA origin, about the carotid bifurcation and within the proximal ICA. Vertebral arteries: The vertebral arteries are patent within the neck. Calcified plaque at the origin of the right vertebral artery likely resulting in at least moderate stenosis. Venous reflux from a dense right-sided contrast bolus limits evaluation of the V1 and proximal V2 right vertebral artery. Within this limitation, no hemodynamically significant stenosis is identified elsewhere within the cervical right vertebral artery. Atherosclerotic plaque within the proximal V1 left vertebral artery resulting in moderate stenosis. Nonstenotic atherosclerotic plaque within the left vertebral artery at the V2 left vertebral artery at the C1 level. The left vertebral artery is dominant. Skeleton: Cervical spondylosis. No acute fracture or aggressive osseous lesion. Other neck: Subcentimeter calcified nodule within the left thyroid lobe not meeting consensus criteria for ultrasound follow-up based on size. No follow-up imaging recommended. Reference: J Am Coll Radiol. 2015 Feb;12(2): 143-50. Upper chest: No consolidation within the imaged lung  apices. Review of the MIP images confirms the above findings CTA HEAD FINDINGS Anterior circulation: The intracranial internal carotid arteries are patent. Calcified atherosclerotic plaque within both vessels with no more than mild stenosis. The M1 middle cerebral arteries are patent. Atherosclerotic irregularity of the M2 and more distal MCA vessels bilaterally. No M2 proximal branch occlusion or high-grade proximal stenosis. The anterior cerebral arteries are patent. No intracranial aneurysm is identified. Posterior circulation: The intracranial vertebral arteries are patent. The basilar artery is patent. The right posterior inferior cerebellar artery and left anterior inferior cerebellar artery are poorly delineated. The posterior cerebral arteries are patent. A left posterior communicating artery  is present. The right posterior communicating artery is diminutive or absent. Venous sinuses: The dural venous sinuses are poorly assessed due to contrast timing. Anatomic variants: As described. Review of the MIP images confirms the above findings IMPRESSION: Non-contrast head CT: No evidence of an acute intracranial abnormality. CTA neck: 1. The common carotid and internal carotid arteries are patent within the neck without hemodynamically significant stenosis. Atherosclerotic plaque bilaterally, as described. 2. Vertebral arteries patent within the neck. Venous reflux from a dense right-sided contrast bolus limits evaluation of the V1 and proximal V2 right vertebral artery. Atherosclerotic plaque at the origin of the right vertebral artery, likely resulting in at least moderate stenosis. Moderate atherosclerotic narrowing at the origin of the left vertebral artery. 3.  Aortic Atherosclerosis (ICD10-I70.0). CTA head: 1. The right posterior inferior cerebellar artery and left anterior inferior cerebellar artery are poorly delineated. This may be due to developmentally small vessel size. However, vessel occlusions at these sites cannot be excluded. Consider a brain MRI for further evaluation. 2. No intracranial large vessel occlusion or proximal high-grade arterial stenosis identified elsewhere. 3. Intracranial atherosclerotic disease, as described. Electronically Signed   By: Jackey Loge D.O.   On: 01/15/2023 15:32   DG Chest Portable 1 View  Result Date: 01/15/2023 CLINICAL DATA:  Fall, dizziness EXAM: PORTABLE CHEST 1 VIEW COMPARISON:  Portable exam 1430 hours compared to 05/17/2022 FINDINGS: Normal heart size, mediastinal contours, and pulmonary vascularity. Lungs clear. No pulmonary infiltrate, pleural effusion, or pneumothorax. No acute osseous findings. IMPRESSION: No acute abnormalities. Electronically Signed   By: Ulyses Southward M.D.   On: 01/15/2023 14:29   DG Humerus Left  Result Date:  01/15/2023 CLINICAL DATA:  Fall EXAM: LEFT HUMERUS - 2 VIEW COMPARISON:  None Available. FINDINGS: There is no evidence of fracture or other focal bone lesions. Soft tissues are unremarkable. IMPRESSION: Negative. Electronically Signed   By: Wiliam Ke M.D.   On: 01/15/2023 14:28    Microbiology: No results found for this or any previous visit. Labs: CBC: Recent Labs  Lab 01/15/23 1011 01/16/23 1340  WBC 7.2 6.2  HGB 14.8 14.2  HCT 44.6 42.3  MCV 88.8 87.6  PLT 218 202   Basic Metabolic Panel: Recent Labs  Lab 01/15/23 1011 01/16/23 1340  NA 139 137  K 3.8 3.8  CL 107 103  CO2 23 24  GLUCOSE 169* 98  BUN 15 15  CREATININE 0.82 1.04*  CALCIUM 9.0 9.1  MG  --  2.0   Liver Function Tests: Recent Labs  Lab 01/15/23 1844  AST 31  ALT 33  ALKPHOS 68  BILITOT 0.6  PROT 6.7  ALBUMIN 3.9   CBG: No results for input(s): "GLUCAP" in the last 168 hours.  Discharge time spent: greater than 30 minutes.  Signed: Lynden Oxford, MD [facility]

## 2023-01-17 NOTE — TOC Initial Note (Addendum)
Transition of Care [facility]) - Initial/Assessment Note    Patient Details  Name: Kellie Phelps MRN: [id number] Date of Birth: 01/29/1946  Transition of Care [facility]) CM/SW Contact:    Kellie Katz, LCSW Phone Number: 01/17/2023, 9:01 AM  Clinical Narrative:        CSW spoke with patient regarding HH recommendations. [name] reports she would like HH and states she fell prior to admission and would benefit from getting stronger at home. Pt states that she lives alone and does not drive but has her niece take her to appts. Pt reports she is still seeing Kellie Phelps for primary care and that she has no preference for [facility] agency. Pt is interested in getting a RW. CSW will order to be sent to room via [employer].            9:35am Kellie Phelps with [facility] accepted for [facility] PT/OT.        Patient Goals and CMS Choice            Expected Discharge Plan and Services                                              Prior Living Arrangements/Services                       Activities of Daily Living      Permission Sought/Granted                  Emotional Assessment              Admission diagnosis:  Dizziness [R42] Vertigo [R42] Weakness [R53.1] Patient Active Problem List   Diagnosis Date Noted   Weakness 01/15/2023   Sinusitis 01/15/2023   Mild intermittent asthma without complication 01/18/2022   Gastric erythema 01/18/2022   Seasonal allergies 01/18/2022   Anxiety 01/18/2022   Coronary artery disease, non-occlusive    Diastolic dysfunction    Atypical chest pain    Hyperglycemia    COPD (chronic obstructive pulmonary disease) (HCC) 04/02/2011   Hyperlipidemia 07/15/2009   Essential hypertension 07/15/2009   SHORTNESS OF BREATH 07/15/2009   TOBACCO ABUSE, HX OF 07/15/2009   PCP:  Kellie Mail, DO Pharmacy:   [facility] - [place], [place] - [address] [place] [address] Phone: [phone] Fax:  [phone]  [id number] - [address]), [place] - [address] [place]) [place] [address] Phone: [phone] Fax: [phone]     Social Determinants of Health (SDOH) Social History: SDOH Screenings   Food Insecurity: No Food Insecurity (01/07/2023)  Housing: Low Risk  (01/07/2023)  Transportation Needs: No Transportation Needs (01/07/2023)  Utilities: Not At Risk (07/05/2022)  Alcohol Screen: Low Risk  (01/07/2023)  Depression (PHQ2-9): Low Risk  (01/11/2023)  Financial Resource Strain: Low Risk  (01/07/2023)  Physical Activity: Insufficiently Active (01/07/2023)  Social Connections: Moderately Integrated (01/07/2023)  Stress: No Stress Concern Present (01/07/2023)  Tobacco Use: Medium Risk (01/15/2023)   SDOH Interventions:     Readmission Risk Interventions     No data to display

## 2023-01-17 NOTE — Progress Notes (Signed)
Occupational Therapy Treatment Patient Details Name: Kellie Phelps MRN: [id number] DOB: Jan 26, 1946 Today's Date: 01/17/2023   History of present illness [name] is a 77 year old female with history of hypertension, hyperlipidemia, CAD, history of COPD follows with [place] pulmonology, GERD, anxiety, who presents emergency department from home for chief concerns of falling and increased weakness.   OT comments  [name] was seen for OT treatment on this date. Upon arrival to room pt reclined in bed, agreeable to tx. Pt requires SUPERVISION + RW for funcitonal mobility ~200 ft. MOD I don B socks seated EOB and bed mobility using rails. Educated on DME recs including 3-in-1. Pt making good progress toward goals, will continue to follow POC. Discharge recommendation remains appropriate.     Recommendations for follow up therapy are one component of a multi-disciplinary discharge planning process, led by the attending physician.  Recommendations may be updated based on patient status, additional functional criteria and insurance authorization.    Assistance Recommended at Discharge Intermittent Supervision/Assistance  Patient can return home with the following  A little help with walking and/or transfers;A little help with bathing/dressing/bathroom;Assistance with cooking/housework;Assist for transportation;Help with stairs or ramp for entrance   Equipment Recommendations  None recommended by OT    Recommendations for Other Services      Precautions / Restrictions Precautions Precautions: Fall Restrictions Weight Bearing Restrictions: No       Mobility Bed Mobility Overal bed mobility: Modified Independent                  Transfers Overall transfer level: Needs assistance Equipment used: Rolling walker (2 wheels) Transfers: Sit to/from Stand Sit to Stand: Supervision                 Balance Overall balance assessment: Needs  assistance Sitting-balance support: Feet supported Sitting balance-Leahy Scale: Good     Standing balance support: No upper extremity supported, During functional activity Standing balance-Leahy Scale: Fair                             ADL either performed or assessed with clinical judgement   ADL Overall ADL's : Needs assistance/impaired                                       General ADL Comments: SUPERVISION + RW for funcitonal mobility ~200 ft. MOD I don B socks seated EOB      Cognition Arousal/Alertness: Awake/alert Behavior During Therapy: WFL for tasks assessed/performed Overall Cognitive Status: Within Functional Limits for tasks assessed                                                     Pertinent Vitals/ Pain       Pain Assessment Pain Assessment: No/denies pain   Frequency  Min 1X/week        Progress Toward Goals  OT Goals(current goals can now be found in the care plan section)  Progress towards OT goals: Progressing toward goals  Acute Rehab OT Goals Patient Stated Goal: to go home OT Goal Formulation: With patient Time For Goal Achievement: 01/30/23 Potential to Achieve Goals: Good ADL Goals Pt Will Perform Lower Body Dressing: Independently;sit to/from  stand Pt Will Transfer to Toilet: Independently;ambulating Pt Will Perform Toileting - Clothing Manipulation and hygiene: Independently;sit to/from stand  Plan Discharge plan remains appropriate;Frequency remains appropriate    Co-evaluation                 AM-PAC OT "6 Clicks" Daily Activity     Outcome Measure   Help from another person eating meals?: None Help from another person taking care of personal grooming?: A Little Help from another person toileting, which includes using toliet, bedpan, or urinal?: A Little Help from another person bathing (including washing, rinsing, drying)?: A Little Help from another person to put on and  taking off regular upper body clothing?: None Help from another person to put on and taking off regular lower body clothing?: A Little 6 Click Score: 20    End of Session Equipment Utilized During Treatment: Rolling walker (2 wheels)  OT Visit Diagnosis: Dizziness and giddiness (R42);History of falling (Z91.81);Muscle weakness (generalized) (M62.81);Unsteadiness on feet (R26.81)   Activity Tolerance Patient tolerated treatment well   Patient Left in bed;with call bell/phone within reach   Nurse Communication          Time: 1610-9604 OT Time Calculation (min): 10 min  Charges: OT General Charges $OT Visit: 1 Visit OT Treatments $Self Care/Home Management : 8-22 mins  Kathie Dike, M.S. OTR/L  01/17/23, 2:15 PM  ascom [phone]

## 2023-01-18 ENCOUNTER — Telehealth: Payer: Self-pay | Admitting: Internal Medicine

## 2023-01-18 NOTE — Telephone Encounter (Signed)
Sonni calling from [facility] is calling because referral for HTN & Falls. Referred for inhome vestibular rehab. Calling to report [facility] does not doe vestibular rehab. CB- [phone]

## 2023-01-18 NOTE — Telephone Encounter (Signed)
FYI looks like ER ordered

## 2023-01-18 NOTE — Telephone Encounter (Signed)
Left detailed vm °

## 2023-01-21 NOTE — Progress Notes (Unsigned)
Established Patient Office Visit  Subjective    Patient ID: Kellie Phelps, female    DOB: 1946-02-12  Age: 77 y.o. MRN: [id number]  CC:  No chief complaint on file.   HPI Kellie Phelps presents for hospital follow up.  Discharge Date: 01/17/23 Diagnosis: BPPV Procedures/tests: CT head negative, CTA negative. MRI brain negative for acute stroke Consultants: Neurology New medications: None Discontinued medications: None Discharge instructions:  Referral placed for vestibular therapy  Status: {Blank multiple:19196::"better","worse","stable","fluctuating"}  Patient presented after a fall due to vertigo.   Hypertension/Diastolic HF: -Medications: Amlodipine 5 mg, Metoprolol 25 mg, Nitroglycerin PRN -Patient is compliant with above medications and reports no side effects. -Checking BP at home (average): 120-130/60-70, stable  -Denies any SOB, CP, vision changes, LE edema or symptoms of hypotension -Follows with cardiology in [place], had a stress test on 10/13/21, EF 62% with small reversible defect of the anteroseptal wall in the apical and mid segments but otherwise normal.  -Last echo 05/12/2019 showing EF 60-65% with left ventricular diastolic impaired relaxation.   HLD/CAD: -Medications: Zocor 40 mg, aspirin 81 mg, fish oil  -Patient is compliant with above medications and reports no side effects.  -Last lipid panel: Lipid Panel     Component Value Date/Time   CHOL 146 01/16/2023 0441   CHOL 155 03/31/2015 1439   TRIG 102 01/16/2023 0441   HDL 47 01/16/2023 0441   HDL 45 03/31/2015 1439   CHOLHDL 3.1 01/16/2023 0441   VLDL 20 01/16/2023 0441   LDLCALC 79 01/16/2023 0441   LDLCALC 75 01/18/2022 1350   LABVLDL 43 (H) 03/31/2015 1439    COPD/Asthma: -Following with [facility] -COPD status: exacerbated -Current medications: Advair, Albuterol PRN. -Satisfied with current treatment?: yes -Oxygen use: no -Dyspnea frequency: with  exertion occasionally -Cough frequency: yes, current  -Rescue inhaler frequency:  about twice a day  -Wheezing: No -Limitation of activity: no -Productive cough: no -Last Spirometry/PFTs: 2019 -Pneumovax: Not up to Date, continues to politely decline -Influenza: Not up to Date  GERD: -Currently on Pantoprazole 40 mg -Following with GI, note reviewed from 03/06/22.  Had EEG on 05/25/2022 which showed diffuse mildly erythematous mucosa without bleeding in the gastric fundus and gastric body.  Biopsy showed esophageal candidiasis and she was treated with oral fluconazole.  Glaucoma: -Currently on Dorzolamide 2% ophthalmic solution, following with Dr. Alvester Morin  -Had both cataracts done this past May, doing well   Seasonal Allergies: -Currently using Flonase  -Not on an oral antihistamine   Anxiety: -Duration:stable -Currently on Lexapro 10 mg daily  -She is compliant with medications and denies side effects. -Had been on Diazepam in the past, discussed how this medication would not be prescribed here. She was given Hydroxyzine 10 mg at her LOV for as needed anxiety, she took it once and felt very sleepy.      01/11/2023    1:10 PM 11/15/2022    9:28 AM 10/30/2022    8:16 AM 09/06/2022    8:24 AM 07/05/2022   11:03 AM  Depression screen PHQ 2/9  Decreased Interest 0 0 0 0 0  Down, Depressed, Hopeless 0 0 0 0 0  PHQ - 2 Score 0 0 0 0 0  Altered sleeping 0 0 0 0 0  Tired, decreased energy 0 0 0 0 0  Change in appetite 0 0 0 0 0  Feeling bad or failure about yourself  0 0 0 0 0  Trouble concentrating 0 0 0 0 0  Moving slowly or fidgety/restless 0 0 0 0 0  Suicidal thoughts 0 0 0 0 0  PHQ-9 Score 0 0 0 0 0  Difficult doing work/chores Not difficult at all Not difficult at all Not difficult at all Not difficult at all    Elevated Liver Enzymes: -Labs from 01/18/22: AST 43, ALT 49 -Denies Tylenol or alcohol use  Pre-Diabetes: -A1c 01/18/22 5.8% -Not currently on medication   Health  Maintenance: -Blood work UTD -Mammogram 11/23, Birads-1   Outpatient Encounter Medications as of 01/22/2023  Medication Sig   albuterol (PROAIR HFA) 108 (90 Base) MCG/ACT inhaler Inhale 2 puffs into the lungs every 6 (six) hours as needed. Wheezing or shortness of breath.   aspirin 81 MG tablet Take 81 mg by mouth daily.   benzonatate (TESSALON) 100 MG capsule Take 1 capsule (100 mg total) by mouth 2 (two) times daily as needed for cough. (Patient not taking: Reported on 01/15/2023)   Calcium Carbonate-Vitamin D 600-200 MG-UNIT TABS Take by mouth.   dorzolamide (TRUSOPT) 2 % ophthalmic solution Place 1 drop into both eyes 2 (two) times daily.   escitalopram (LEXAPRO) 10 MG tablet Take 1 tablet (10 mg total) by mouth daily.   fluticasone (FLONASE) 50 MCG/ACT nasal spray Place 2 sprays into both nostrils daily.   fluticasone-salmeterol (ADVAIR) 250-50 MCG/ACT AEPB Inhale 1 puff into the lungs in the morning and at bedtime.   meclizine (ANTIVERT) 25 MG tablet Take 1 tablet (25 mg total) by mouth 3 (three) times daily.   Multiple Vitamins-Minerals (ONE-A-DAY WOMENS 50 PLUS PO) Take by mouth daily.   nitroGLYCERIN (NITROSTAT) 0.4 MG SL tablet Place 1 tablet (0.4 mg total) under the tongue every 5 (five) minutes as needed for chest pain.   Omega-3 Fatty Acids (FISH OIL) 1000 MG CAPS 1,000 mg. Take two tablets by mouth daily.   pantoprazole (PROTONIX) 40 MG tablet Take 1 tablet (40 mg total) by mouth daily.   simvastatin (ZOCOR) 40 MG tablet Take 1 tablet (40 mg total) by mouth daily.   VYZULTA 0.024 % SOLN Place 1 drop into both eyes every evening.   No facility-administered encounter medications on file as of 01/22/2023.    Past Medical History:  Diagnosis Date   Allergy    Anxiety    Asthma    Atypical chest pain    Cataract    COPD (chronic obstructive pulmonary disease) (HCC)    Coronary artery disease, non-occlusive    a. LHC 6/08 EF 55-60%, 20-30% proximal LAD, LIs CFX, LIs RCA  (Harwani); b. ETT-myoview 11/10, 3' stopped due to fatigue and chest tightness, EF normal, probably normal perfusion images with minimal soft tissue attenuation   Diastolic dysfunction    a. echo 2010: EF 60-65%, GR1DD, no AI, trivial MR, LA nl   GERD (gastroesophageal reflux disease)    Glaucoma    Hyperglycemia    Hyperlipidemia    Hypertension     Past Surgical History:  Procedure Laterality Date   ESOPHAGOGASTRODUODENOSCOPY (EGD) WITH PROPOFOL N/A 05/25/2022   Procedure: ESOPHAGOGASTRODUODENOSCOPY (EGD) WITH PROPOFOL;  Surgeon: Toney Reil, MD;  Location: [facility] ENDOSCOPY;  Service: Gastroenterology;  Laterality: N/A;  RIDE IS ABOUT 20 MINUTES OUT   TONSILLECTOMY      Family History  Problem Relation Age of Onset   Uterine cancer Mother    Heart attack Father    Pulmonary embolism Brother    Hepatitis Brother    Coronary artery disease Brother    Coronary artery disease  Other        Aunt    Social History   Socioeconomic History   Marital status: Single    Spouse name: Not on file   Number of children: Not on file   Years of education: Not on file   Highest education level: GED or equivalent  Occupational History   Occupation: Retired    Associate Professor: RETIRED  Tobacco Use   Smoking status: Former    Packs/day: 0.50    Years: 5.00    Additional pack years: 0.00    Total pack years: 2.50    Types: Cigarettes    Quit date: 09/17/1978    Years since quitting: 44.3   Smokeless tobacco: Never   Tobacco comments:    unsure how long she smoked for--10/27/2020  Vaping Use   Vaping Use: Never used  Substance and Sexual Activity   Alcohol use: No   Drug use: No   Sexual activity: Not Currently    Partners: Male    Birth control/protection: Post-menopausal  Other Topics Concern   Not on file  Social History Narrative   Divorced   Lives alone   No children   Does not get regular exercise   Social Determinants of Health   Financial Resource Strain: Low Risk   (01/07/2023)   Overall Financial Resource Strain (CARDIA)    Difficulty of Paying Living Expenses: Not hard at all  Food Insecurity: No Food Insecurity (01/07/2023)   Hunger Vital Sign    Worried About Running Out of Food in the Last Year: Never true    Ran Out of Food in the Last Year: Never true  Transportation Needs: No Transportation Needs (01/07/2023)   PRAPARE - Administrator, Civil Service (Medical): No    Lack of Transportation (Non-Medical): No  Physical Activity: Insufficiently Active (01/07/2023)   Exercise Vital Sign    Days of Exercise per Week: 2 days    Minutes of Exercise per Session: 20 min  Stress: No Stress Concern Present (01/07/2023)   [employer] of Occupational Health - Occupational Stress Questionnaire    Feeling of Stress : Only a little  Social Connections: Moderately Integrated (01/07/2023)   Social Connection and Isolation Panel [NHANES]    Frequency of Communication with Friends and Family: Twice a week    Frequency of Social Gatherings with Friends and Family: Once a week    Attends Religious Services: More than 4 times per year    Active Member of [employer] or Organizations: No    Attends Engineer, structural: More than 4 times per year    Marital Status: Widowed  Intimate Partner Violence: Not At Risk (07/05/2022)   Humiliation, Afraid, Rape, and Kick questionnaire    Fear of Current or Ex-Partner: No    Emotionally Abused: No    Physically Abused: No    Sexually Abused: No    Review of Systems  Constitutional:  Negative for chills and fever.  HENT:  Positive for congestion, ear pain, sinus pain and sore throat.   Respiratory:  Positive for cough and sputum production. Negative for shortness of breath and wheezing.   Cardiovascular:  Negative for chest pain.  Neurological:  Negative for dizziness and headaches.      Objective    There were no vitals taken for this visit.  Physical Exam Constitutional:      Appearance:  Normal appearance.  HENT:     Head: Normocephalic and atraumatic.     Right  Ear: Tympanic membrane, ear canal and external ear normal.     Left Ear: Tympanic membrane, ear canal and external ear normal.     Nose: Congestion present.     Mouth/Throat:     Mouth: Mucous membranes are moist.     Pharynx: Oropharynx is clear.  Eyes:     Conjunctiva/sclera: Conjunctivae normal.  Cardiovascular:     Rate and Rhythm: Normal rate and regular rhythm.  Pulmonary:     Effort: Pulmonary effort is normal.     Breath sounds: Wheezing present. No rhonchi or rales.  Skin:    General: Skin is warm and dry.  Neurological:     General: No focal deficit present.     Mental Status: She is alert. Mental status is at baseline.  Psychiatric:        Mood and Affect: Mood normal.        Behavior: Behavior normal.       Assessment & Plan:   1. COPD exacerbation (HCC): Will treat COPD exacerbation with prednisone 40 mg x 5 days as well as doxycycline.  Refill Advair and albuterol.  Cough suppressant prescribed.  Follow-up if symptoms worsen or fail to improve.  - fluticasone-salmeterol (ADVAIR) 250-50 MCG/ACT AEPB; Inhale 1 puff into the lungs in the morning and at bedtime.  Dispense: 180 each; Refill: 1 - albuterol (PROAIR HFA) 108 (90 Base) MCG/ACT inhaler; Inhale 2 puffs into the lungs every 6 (six) hours as needed. Wheezing or shortness of breath.  Dispense: 18 g; Refill: 10 - predniSONE (DELTASONE) 20 MG tablet; Take 2 tablets (40 mg total) by mouth daily with breakfast for 5 days.  Dispense: 10 tablet; Refill: 0 - doxycycline (VIBRA-TABS) 100 MG tablet; Take 1 tablet (100 mg total) by mouth 2 (two) times daily for 5 days.  Dispense: 10 tablet; Refill: 0 - benzonatate (TESSALON) 100 MG capsule; Take 1 capsule (100 mg total) by mouth 2 (two) times daily as needed for cough.  Dispense: 20 capsule; Refill: 0  2. Essential hypertension: Blood pressure stable.  Continue metoprolol 25 mg daily,  refilled.  - metoprolol succinate (TOPROL-XL) 25 MG 24 hr tablet; Take 1 tablet (25 mg total) by mouth daily.  Dispense: 90 tablet; Refill: 1  3. Mixed hyperlipidemia/Coronary artery disease, non-occlusive: Stable, continue simvastatin 40 mg, refilled.  - simvastatin (ZOCOR) 40 MG tablet; Take 1 tablet (40 mg total) by mouth daily.  Dispense: 90 tablet; Refill: 1  4. Anxiety: Stable, doing well on Lexapro 10 mg, refilled.  - escitalopram (LEXAPRO) 10 MG tablet; Take 1 tablet (10 mg total) by mouth daily.  Dispense: 90 tablet; Refill: 1   No follow-ups on file.   Margarita Mail, DO

## 2023-01-22 ENCOUNTER — Ambulatory Visit (INDEPENDENT_AMBULATORY_CARE_PROVIDER_SITE_OTHER): Payer: 59 | Admitting: Internal Medicine

## 2023-01-22 ENCOUNTER — Telehealth: Payer: Self-pay | Admitting: Internal Medicine

## 2023-01-22 ENCOUNTER — Encounter: Payer: Self-pay | Admitting: Internal Medicine

## 2023-01-22 ENCOUNTER — Other Ambulatory Visit: Payer: Self-pay

## 2023-01-22 VITALS — BP 130/80 | HR 95 | Temp 98.3°F | Resp 16 | Ht 65.0 in | Wt 202.6 lb

## 2023-01-22 DIAGNOSIS — I10 Essential (primary) hypertension: Secondary | ICD-10-CM | POA: Diagnosis not present

## 2023-01-22 DIAGNOSIS — Z09 Encounter for follow-up examination after completed treatment for conditions other than malignant neoplasm: Secondary | ICD-10-CM | POA: Diagnosis not present

## 2023-01-22 DIAGNOSIS — H8110 Benign paroxysmal vertigo, unspecified ear: Secondary | ICD-10-CM

## 2023-01-22 NOTE — Telephone Encounter (Signed)
Home Health Verbal Orders - Caller/Agency: Joey from [facility]  Callback Number: [phone] Requesting PT-plan of care Frequency: 1x4

## 2023-01-22 NOTE — Patient Instructions (Addendum)
It was great seeing you today!  Plan discussed at today's visit: -Start back on home blood pressure medications but keep an eye on blood pressure readings -Can take Meclizine as needed for dizziness -Continue with home PT and try exercises below at home, if dizziness does not improve let me know and we will get you set up with vestibular physical therapy   Follow up in: October or sooner as needed  Take care and let us know if you have any questions or concerns prior to your next visit.  Dr. Caralee Ates   How to Perform the Epley Maneuver The Epley maneuver is an exercise that relieves symptoms of vertigo. Vertigo is the feeling that you or your surroundings are moving when they are not. When you feel vertigo, you may feel like the room is spinning and may have trouble walking. The Epley maneuver is used for a type of vertigo caused by a calcium deposit in a part of the inner ear. The maneuver involves changing head positions to help the deposit move out of the area. You can do this maneuver at home whenever you have symptoms of vertigo. You can repeat it in 24 hours if your vertigo has not gone away. Even though the Epley maneuver may relieve your vertigo for a few weeks, it is possible that your symptoms will return. This maneuver relieves vertigo, but it does not relieve dizziness. What are the risks? If it is done correctly, the Epley maneuver is considered safe. Sometimes it can lead to dizziness or nausea that goes away after a short time. If you develop other symptoms--such as changes in vision, weakness, or numbness--stop doing the maneuver and call your health care provider. Supplies needed: A bed or table. A pillow. How to do the Epley maneuver     Sit on the edge of a bed or table with your back straight and your legs extended or hanging over the edge of the bed or table. Turn your head halfway toward the affected ear or side as told by your health care provider. Lie backward  quickly with your head turned until you are lying flat on your back. Your head should dangle (head-hanging position). You may want to position a pillow under your shoulders. Hold this position for at least 30 seconds. If you feel dizzy or have symptoms of vertigo, continue to hold the position until the symptoms stop. Turn your head to the opposite direction until your unaffected ear is facing down. Your head should continue to dangle. Hold this position for at least 30 seconds. If you feel dizzy or have symptoms of vertigo, continue to hold the position until the symptoms stop. Turn your whole body to the same side as your head so that you are positioned on your side. Your head will now be nearly facedown and no longer needs to dangle. Hold for at least 30 seconds. If you feel dizzy or have symptoms of vertigo, continue to hold the position until the symptoms stop. Sit back up. You can repeat the maneuver in 24 hours if your vertigo does not go away. Follow these instructions at home: For 24 hours after doing the Epley maneuver: Keep your head in an upright position. When lying down to sleep or rest, keep your head raised (elevated) with two or more pillows. Avoid excessive neck movements. Activity Do not drive or use machinery if you feel dizzy. After doing the Epley maneuver, return to your normal activities as told by your health care  provider. Ask your health care provider what activities are safe for you. General instructions Drink enough fluid to keep your urine pale yellow. Do not drink alcohol. Take over-the-counter and prescription medicines only as told by your health care provider. Keep all follow-up visits. This is important. Preventing vertigo symptoms Ask your health care provider if there is anything you should do at home to prevent vertigo. He or she may recommend that you: Keep your head elevated with two or more pillows while you sleep. Do not sleep on the side of your  affected ear. Get up slowly from bed. Avoid sudden movements during the day. Avoid extreme head positions or movement, such as looking up or bending over. Contact a health care provider if: Your vertigo gets worse. You have other symptoms, including: Nausea. Vomiting. Headache. Get help right away if you: Have vision changes. Have a headache or neck pain that is severe or getting worse. Cannot stop vomiting. Have new numbness or weakness in any part of your body. These symptoms may represent a serious problem that is an emergency. Do not wait to see if the symptoms will go away. Get medical help right away. Call your local emergency services (911 in the [place] Do not drive yourself to the hospital. Summary Vertigo is the feeling that you or your surroundings are moving when they are not. The Epley maneuver is an exercise that relieves symptoms of vertigo. If the Epley maneuver is done correctly, it is considered safe. This information is not intended to replace advice given to you by your health care provider. Make sure you discuss any questions you have with your health care provider. Document Revised: 08/03/2020 Document Reviewed: 08/03/2020 Elsevier Patient Education  2023 [employer].

## 2023-01-23 NOTE — Congregational Nurse Program (Signed)
  Dept: [phone]   Congregational Nurse Program Note  Date of Encounter: 01/23/2023  Resident came to clinic for blood pressure check after recent hospital visit for fall due to dizziness.  Has multiple healing bruises on left shoulder, inner aspect of left upper arm and two bruises left lower quadrant of abdomen. Has small 2 cm hematoma inner aspect right arm brachial area and red area right hand from blood draws and IV insertion while in hospital.  BP 118/70, pulse 70 and regular, O2 Sat 97%.  Had been taken off BP medication after the fall as suspected lower BP then restarted. No complaints of dizziness, chest pain, no palpitations.  Educated regarding fall prevention and wearing footwear that is closed toe and heel. Past Medical History: Past Medical History:  Diagnosis Date   Allergy    Anxiety    Asthma    Atypical chest pain    Cataract    COPD (chronic obstructive pulmonary disease) (HCC)    Coronary artery disease, non-occlusive    a. LHC 6/08 EF 55-60%, 20-30% proximal LAD, LIs CFX, LIs RCA (Harwani); b. ETT-myoview 11/10, 3' stopped due to fatigue and chest tightness, EF normal, probably normal perfusion images with minimal soft tissue attenuation   Diastolic dysfunction    a. echo 2010: EF 60-65%, GR1DD, no AI, trivial MR, LA nl   GERD (gastroesophageal reflux disease)    Glaucoma    Hyperglycemia    Hyperlipidemia    Hypertension     Encounter Details:  CNP Questionnaire - 01/23/23 1030       Questionnaire   Ask client: Do you give verbal consent for me to treat you today? Yes    Student Assistance N/A    Location Patient Served  [facility]    Visit Setting with Client Organization    Patient Status Unknown   Has own apartment at [facility];Medicare    Insurance/Financial Assistance Referral N/A    Medication N/A    Medical Provider Yes    Screening Referrals Made N/A    Medical Referrals Made [facility] PCP/[facility]inic    Medical  Appointment Made N/A    Recently w/o PCP, now 1st time PCP visit completed due to CNs referral or appointment made N/A    Food N/A    Transportation Need transportation assistance    Housing/Utilities N/A    Interpersonal Safety N/A    Interventions Counsel;Advocate/Support;Educate;Reviewed New Diagnosis;Reviewed Medications    Abnormal to Normal Screening Since Last CN Visit N/A    Screenings CN Performed Blood Pressure;Pulse Ox    Sent Client to Lab for: N/A    Did client attend any of the following based off CNs referral or appointments made? N/A    ED Visit Averted N/A    Life-Saving Intervention Made N/A

## 2023-01-25 ENCOUNTER — Ambulatory Visit: Payer: Medicare Other | Admitting: Internal Medicine

## 2023-01-25 ENCOUNTER — Telehealth: Payer: Self-pay | Admitting: Internal Medicine

## 2023-01-25 NOTE — Telephone Encounter (Unsigned)
Copied from CRM [id number]. Topic: Quick Communication - Home Health Verbal Orders >> Jan 25, 2023 10:40 AM Macon Large wrote: Caller/Agency: Junious Dresser with [facility]  Callback Number: [phone] Requesting OT/PT/Skilled Nursing/Social Work/Speech Therapy: OT Frequency: 1 x 3

## 2023-01-28 ENCOUNTER — Ambulatory Visit: Payer: 59 | Admitting: Gastroenterology

## 2023-01-31 NOTE — Congregational Nurse Program (Signed)
  Dept: [phone]   Congregational Nurse Program Note  Date of Encounter: 01/30/2023  Clinic visit to check blood pressure and to look at bruises from the fall that she had 2 weeks ago.  All of the bruises are healing, no open area or drainage any place on left upper arm, left shoulder or the bruises on on right hand and arm from where IVs inserted when she went to the hospital.  BP 118/72, pulse 60 and regular.  Discussed normal ranges for blood pressure and pulses and when to call MD if any side effects from restarting BP medications.  Past Medical History: Past Medical History:  Diagnosis Date   Allergy    Anxiety    Asthma    Atypical chest pain    Cataract    COPD (chronic obstructive pulmonary disease) (HCC)    Coronary artery disease, non-occlusive    a. LHC 6/08 EF 55-60%, 20-30% proximal LAD, LIs CFX, LIs RCA (Harwani); b. ETT-myoview 11/10, 3' stopped due to fatigue and chest tightness, EF normal, probably normal perfusion images with minimal soft tissue attenuation   Diastolic dysfunction    a. echo 2010: EF 60-65%, GR1DD, no AI, trivial MR, LA nl   GERD (gastroesophageal reflux disease)    Glaucoma    Hyperglycemia    Hyperlipidemia    Hypertension     Encounter Details:  CNP Questionnaire - 01/30/23 1100       Questionnaire   Ask client: Do you give verbal consent for me to treat you today? Yes    Student Assistance N/A    Location Patient Served  [facility]    Visit Setting with Client Organization    Patient Status Unknown   Has own apartment at [facility];Medicare    Insurance/Financial Assistance Referral N/A    Medication N/A    Medical Provider Yes    Screening Referrals Made N/A    Medical Referrals Made N/A    Medical Appointment Made N/A    Recently w/o PCP, now 1st time PCP visit completed due to CNs referral or appointment made N/A    Food N/A    Transportation Need transportation assistance    Housing/Utilities  N/A    Interpersonal Safety N/A    Interventions Counsel;Advocate/Support;Educate;Reviewed Medications    Abnormal to Normal Screening Since Last CN Visit N/A    Screenings CN Performed Blood Pressure;Pulse Ox    Sent Client to Lab for: N/A    Did client attend any of the following based off CNs referral or appointments made? N/A    ED Visit Averted N/A    Life-Saving Intervention Made N/A

## 2023-02-04 ENCOUNTER — Other Ambulatory Visit: Payer: Self-pay | Admitting: Internal Medicine

## 2023-02-04 DIAGNOSIS — K219 Gastro-esophageal reflux disease without esophagitis: Secondary | ICD-10-CM

## 2023-02-05 NOTE — Telephone Encounter (Signed)
Requested Prescriptions  Pending Prescriptions Disp Refills   pantoprazole (PROTONIX) 40 MG tablet [Pharmacy Med Name: pantoprazole 40 mg tablet,delayed release] 90 tablet 1    Sig: TAKE ONE TABLET BY MOUTH ONCE DAILY     Gastroenterology: Proton Pump Inhibitors Passed - 02/04/2023  7:00 AM      Passed - Valid encounter within last 12 months    Recent Outpatient Visits           2 weeks ago Hospital discharge follow-up   [facility] Margarita Mail, DO   3 weeks ago COPD exacerbation [facility])   [facility] Margarita Mail, DO   2 months ago Acute non-recurrent maxillary sinusitis   [facility] Margarita Mail, DO   3 months ago Seasonal allergies   [facility] Margarita Mail, DO   5 months ago Acute non-recurrent frontal sinusitis   [facility] Margarita Mail, DO       Future Appointments             In 5 months Margarita Mail, DO [facility]

## 2023-02-06 NOTE — Congregational Nurse Program (Signed)
  Dept: [phone]   Congregational Nurse Program Note  Date of Encounter: 02/06/2023  Clinic visit to check blood pressure and pulse from recent change of BP medication.  BP 116/68, pulse 58 and regular.  Discussed contacting MD if pulse in lower 50s but has remained between 57 and 60.  Complains of right shoulder pain from the recent fall although all bruised areas are almost completely back to normal skin tone on right arm and shoulder.   Has made appointment to see orthopedist to evaluate shoulder.  Currently has heart monitor to determine if fall resulted from any type cardia arrhthymia. Past Medical History: Past Medical History:  Diagnosis Date   Allergy    Anxiety    Asthma    Atypical chest pain    Cataract    COPD (chronic obstructive pulmonary disease) (HCC)    Coronary artery disease, non-occlusive    a. LHC 6/08 EF 55-60%, 20-30% proximal LAD, LIs CFX, LIs RCA (Harwani); b. ETT-myoview 11/10, 3' stopped due to fatigue and chest tightness, EF normal, probably normal perfusion images with minimal soft tissue attenuation   Diastolic dysfunction    a. echo 2010: EF 60-65%, GR1DD, no AI, trivial MR, LA nl   GERD (gastroesophageal reflux disease)    Glaucoma    Hyperglycemia    Hyperlipidemia    Hypertension     Encounter Details:  CNP Questionnaire - 02/06/23 1030       Questionnaire   Ask client: Do you give verbal consent for me to treat you today? Yes    Student Assistance N/A    Location Patient Served  [facility]    Visit Setting with Client Organization    Patient Status Unknown   Has own apartment at [facility];Medicare    Insurance/Financial Assistance Referral N/A    Medication N/A    Medical Provider Yes    Screening Referrals Made N/A    Medical Referrals Made N/A    Medical Appointment Made N/A    Recently w/o PCP, now 1st time PCP visit completed due to CNs referral or appointment made N/A    Food N/A    Transportation  Need transportation assistance    Housing/Utilities N/A    Interpersonal Safety N/A    Interventions Counsel;Advocate/Support;Educate;Case Management    Abnormal to Normal Screening Since Last CN Visit N/A    Screenings CN Performed Blood Pressure;Pulse Ox    Sent Client to Lab for: N/A    Did client attend any of the following based off CNs referral or appointments made? N/A    ED Visit Averted N/A    Life-Saving Intervention Made N/A

## 2023-02-12 ENCOUNTER — Encounter: Payer: Self-pay | Admitting: Primary Care

## 2023-02-12 ENCOUNTER — Ambulatory Visit (INDEPENDENT_AMBULATORY_CARE_PROVIDER_SITE_OTHER): Payer: 59 | Admitting: Primary Care

## 2023-02-12 VITALS — BP 132/78 | HR 61 | Temp 97.8°F | Ht 65.0 in | Wt 214.6 lb

## 2023-02-12 DIAGNOSIS — J4489 Other specified chronic obstructive pulmonary disease: Secondary | ICD-10-CM

## 2023-02-12 DIAGNOSIS — J449 Chronic obstructive pulmonary disease, unspecified: Secondary | ICD-10-CM | POA: Diagnosis not present

## 2023-02-12 MED ORDER — IPRATROPIUM-ALBUTEROL 0.5-2.5 (3) MG/3ML IN SOLN: 3.0000 mL | Freq: Four times a day (QID) | RESPIRATORY_TRACT | 6 refills | Status: DC | PRN

## 2023-02-12 NOTE — Progress Notes (Signed)
@Patient  ID: Kellie Phelps, female    DOB: 07-06-1946, 77 y.o.   MRN: [id number]  Chief Complaint  Patient presents with   Follow-up    Breathing is doing well. Occ SOB with exertion    Referring provider: Margarita Mail, DO  HPI: 77 year old female, former smoker.  Past medical history significant for hypertension, coronary artery disease, COPD/asthma, benign paroxysmal positional vertigo, seasonal allergies, atypical chest pain, diastolic dysfunction, hyperlipidemia.  Previous [facility] pulmonary encounter: 10/27/20 Patient has a history of mild persistent asthma without complication.  Has been on Advair for a number of years.  Notices some shortness of breath more than her usual baseline.  She has not had PFTs since 2019.  Will reassess lung function.  She will be given a trial of Trelegy Ellipta to optimize her asthma management.  She is to let us know if this is of benefit.  If not she may continue on Advair until reassessed.  We will see her in follow-up in she is to contact us prior to that time should any new difficulties arise.   02/12/2023 Patient presents today for an overdue follow-up for COPD/asthma.  She was last seen in 2022 by Dr. Jayme Phelps. During ov recommended trial Trelegy and re-assess PFTs, if normal consider 2D echo.   She has mild dyspnea and fatigue symptoms when walking. She does not typically need to stop or sit. She recovers easily with rest when needed. She is able to do all ADLs. She does her own shopping. She gets short winded with steps/inclines. She uses Advair twice a day. Uses Albuterol as needed approximately once a week. Needs new nebulizer machine. Currently wearing heart monitor. CXR in April showed clear lungs. Needs updated PFTs. Denies cough, chest tightness or wheezing.   Pulmonary function testing 08/21/2018>> FVC 2.97 (125%), FEV1 1.73 (94%), ratio 58  Moderate obstructive airway disease with significant BD response +air trapping    Cardiac testing: Myoview >>There is an equivocal small reversible defect of the anteroseptal wall in the apical and mid segments. Otherwise normal perfusion study. Normal left ventricular wall motion. Left ventricular ejection fraction 62% Non invasive risk stratification*: Low  Allergies  Allergen Reactions   Penicillins Hives     There is no immunization history on file for this patient.  Past Medical History:  Diagnosis Date   Allergy    Anxiety    Asthma    Atypical chest pain    Cataract    COPD (chronic obstructive pulmonary disease) (HCC)    Coronary artery disease, non-occlusive    a. LHC 6/08 EF 55-60%, 20-30% proximal LAD, LIs CFX, LIs RCA (Harwani); b. ETT-myoview 11/10, 3' stopped due to fatigue and chest tightness, EF normal, probably normal perfusion images with minimal soft tissue attenuation   Diastolic dysfunction    a. echo 2010: EF 60-65%, GR1DD, no AI, trivial MR, LA nl   GERD (gastroesophageal reflux disease)    Glaucoma    Hyperglycemia    Hyperlipidemia    Hypertension     Tobacco History: Social History   Tobacco Use  Smoking Status Former   Packs/day: 0.50   Years: 5.00   Additional pack years: 0.00   Total pack years: 2.50   Types: Cigarettes   Quit date: 09/17/1978   Years since quitting: 44.4  Smokeless Tobacco Never  Tobacco Comments   unsure how long she smoked for--10/27/2020   Counseling given: Not Answered Tobacco comments: unsure how long she smoked for--10/27/2020   Outpatient Medications Prior  to Visit  Medication Sig Dispense Refill   albuterol (PROAIR HFA) 108 (90 Base) MCG/ACT inhaler Inhale 2 puffs into the lungs every 6 (six) hours as needed. Wheezing or shortness of breath. 18 g 10   amLODipine (NORVASC) 5 MG tablet Take 1 tablet (5 mg total) by mouth daily. 90 tablet    aspirin 81 MG tablet Take 81 mg by mouth daily.     Calcium Carbonate-Vitamin D 600-200 MG-UNIT TABS Take by mouth.     dorzolamide (TRUSOPT) 2 %  ophthalmic solution Place 1 drop into both eyes 2 (two) times daily.     escitalopram (LEXAPRO) 10 MG tablet Take 1 tablet (10 mg total) by mouth daily. 90 tablet 1   fluticasone (FLONASE) 50 MCG/ACT nasal spray Place 2 sprays into both nostrils daily. 16 g 6   fluticasone-salmeterol (ADVAIR) 250-50 MCG/ACT AEPB Inhale 1 puff into the lungs in the morning and at bedtime. 180 each 1   metoprolol succinate (TOPROL-XL) 25 MG 24 hr tablet Take 1 tablet (25 mg total) by mouth daily. 90 tablet 3   Multiple Vitamins-Minerals (ONE-A-DAY WOMENS 50 PLUS PO) Take by mouth daily.     nitroGLYCERIN (NITROSTAT) 0.4 MG SL tablet Place 1 tablet (0.4 mg total) under the tongue every 5 (five) minutes as needed for chest pain. 30 tablet 1   Omega-3 Fatty Acids (FISH OIL) 1000 MG CAPS 1,000 mg. Take two tablets by mouth daily.     pantoprazole (PROTONIX) 40 MG tablet TAKE ONE TABLET BY MOUTH ONCE DAILY 90 tablet 1   simvastatin (ZOCOR) 40 MG tablet Take 1 tablet (40 mg total) by mouth daily. 90 tablet 1   VYZULTA 0.024 % SOLN Place 1 drop into both eyes every evening.     meclizine (ANTIVERT) 25 MG tablet Take 1 tablet (25 mg total) by mouth 3 (three) times daily. (Patient not taking: Reported on 02/12/2023) 30 tablet 0   No facility-administered medications prior to visit.   Review of Systems  Review of Systems  Constitutional: Negative.   HENT: Negative.    Respiratory:  Negative for cough, chest tightness, shortness of breath and wheezing.   Cardiovascular: Negative.      Physical Exam  BP 132/78 (BP Location: Left Arm, Cuff Size: Normal)   Pulse 61   Temp 97.8 F (36.6 C) (Temporal)   Ht 5\' 5"  (1.651 m)   Wt 214 lb 9.6 oz (97.3 kg)   SpO2 94%   BMI 35.71 kg/m  Physical Exam Constitutional:      Appearance: Normal appearance.  HENT:     Head: Normocephalic and atraumatic.  Cardiovascular:     Rate and Rhythm: Normal rate.  Pulmonary:     Effort: Pulmonary effort is normal.     Breath  sounds: Normal breath sounds.  Musculoskeletal:        General: Normal range of motion.  Skin:    General: Skin is warm and dry.  Neurological:     General: No focal deficit present.     Mental Status: She is alert and oriented to person, place, and time. Mental status is at baseline.  Psychiatric:        Mood and Affect: Mood normal.        Behavior: Behavior normal.        Thought Content: Thought content normal.        Judgment: Judgment normal.      Lab Results:  CBC    Component Value Date/Time  WBC 6.2 01/16/2023 1340   RBC 4.83 01/16/2023 1340   HGB 14.2 01/16/2023 1340   HCT 42.3 01/16/2023 1340   PLT 202 01/16/2023 1340   MCV 87.6 01/16/2023 1340   MCH 29.4 01/16/2023 1340   MCHC 33.6 01/16/2023 1340   RDW 13.2 01/16/2023 1340   LYMPHSABS 2.8 05/17/2022 2320   MONOABS 0.5 05/17/2022 2320   EOSABS 0.1 05/17/2022 2320   BASOSABS 0.1 05/17/2022 2320    BMET    Component Value Date/Time   NA 137 01/16/2023 1340   NA 140 03/31/2015 1439   K 3.8 01/16/2023 1340   CL 103 01/16/2023 1340   CO2 24 01/16/2023 1340   GLUCOSE 98 01/16/2023 1340   BUN 15 01/16/2023 1340   BUN 19 03/31/2015 1439   CREATININE 1.04 (H) 01/16/2023 1340   CREATININE 0.96 04/25/2022 1033   CALCIUM 9.1 01/16/2023 1340   GFRNONAA 55 (L) 01/16/2023 1340   GFRAA 72 03/31/2015 1439    BNP No results found for: "BNP"  ProBNP    Component Value Date/Time   PROBNP 12.8 07/15/2009 2105    Imaging: MR Brain W and Wo Contrast  Result Date: 01/15/2023 CLINICAL DATA:  Neuro deficit with acute stroke suspected EXAM: MRI HEAD WITHOUT AND WITH CONTRAST TECHNIQUE: Multiplanar, multiecho pulse sequences of the brain and surrounding structures were obtained without and with intravenous contrast. CONTRAST:  9mL GADAVIST GADOBUTROL 1 MMOL/ML IV SOLN COMPARISON:  CT/A of the head neck from earlier today FINDINGS: Brain: No acute infarction, hemorrhage, hydrocephalus, extra-axial collection or mass  lesion. Mild cerebral volume loss and chronic small vessel ischemic type change for age. No abnormal enhancement. Vascular: Normal flow voids. Skull and upper cervical spine: Hypointense area in the right parietal bone which has a benign appearance by prior CT. Sinuses/Orbits: Bilateral cataract resection. IMPRESSION: No acute finding.  Unremarkable brain MRI for age. Electronically Signed   By: Tiburcio Pea M.D.   On: 01/15/2023 22:13   CT Angio Head Neck W WO CM  Result Date: 01/15/2023 CLINICAL DATA:  Provided history: Vertigo, central. Additional history provided: Dizziness. Fall this morning. Episode of vomiting. EXAM: CT ANGIOGRAPHY HEAD AND NECK WITH AND WITHOUT CONTRAST TECHNIQUE: Multidetector CT imaging of the head and neck was performed using the standard protocol during bolus administration of intravenous contrast. Multiplanar CT image reconstructions and MIPs were obtained to evaluate the vascular anatomy. Carotid stenosis measurements (when applicable) are obtained utilizing NASCET criteria, using the distal internal carotid diameter as the denominator. RADIATION DOSE REDUCTION: This exam was performed according to the departmental dose-optimization program which includes automated exposure control, adjustment of the mA and/or kV according to patient size and/or use of iterative reconstruction technique. CONTRAST:  75mL OMNIPAQUE IOHEXOL 350 MG/ML SOLN COMPARISON:  Head CT 08/19/2010. MRA head 06/10/2008. FINDINGS: CT HEAD FINDINGS Brain: No age advanced or lobar predominant parenchymal atrophy. There is no acute intracranial hemorrhage. No demarcated cortical infarct. No extra-axial fluid collection. No evidence of an intracranial mass. No midline shift. Vascular: No hyperdense vessel. Atherosclerotic calcifications. Skull: No fracture or aggressive osseous lesion. Sinuses/Orbits: No orbital mass or acute orbital finding. No significant paranasal sinus disease. Review of the MIP images confirms  the above findings CTA NECK FINDINGS Aortic arch: Standard aortic branching. Atherosclerotic plaque within the visualized aortic arch and proximal major branch vessels of the neck. Streak and beam hardening artifact arising from a dense right-sided contrast bolus partially obscures the right subclavian artery. Within this limitation, there is no appreciable  hemodynamically significant innominate or proximal subclavian artery stenosis. Right carotid system: CCA and ICA patent within the neck without hemodynamically significant stenosis (50% or greater). Mild sclerotic plaque within the proximal CCA, about the carotid bifurcation and within the proximal ICA. Left carotid system: CCA and ICA patent within the neck without hemodynamically significant stenosis (50% or greater). Mild atherosclerotic plaque at the CCA origin, about the carotid bifurcation and within the proximal ICA. Vertebral arteries: The vertebral arteries are patent within the neck. Calcified plaque at the origin of the right vertebral artery likely resulting in at least moderate stenosis. Venous reflux from a dense right-sided contrast bolus limits evaluation of the V1 and proximal V2 right vertebral artery. Within this limitation, no hemodynamically significant stenosis is identified elsewhere within the cervical right vertebral artery. Atherosclerotic plaque within the proximal V1 left vertebral artery resulting in moderate stenosis. Nonstenotic atherosclerotic plaque within the left vertebral artery at the V2 left vertebral artery at the C1 level. The left vertebral artery is dominant. Skeleton: Cervical spondylosis. No acute fracture or aggressive osseous lesion. Other neck: Subcentimeter calcified nodule within the left thyroid lobe not meeting consensus criteria for ultrasound follow-up based on size. No follow-up imaging recommended. Reference: J Am Coll Radiol. 2015 Feb;12(2): 143-50. Upper chest: No consolidation within the imaged lung  apices. Review of the MIP images confirms the above findings CTA HEAD FINDINGS Anterior circulation: The intracranial internal carotid arteries are patent. Calcified atherosclerotic plaque within both vessels with no more than mild stenosis. The M1 middle cerebral arteries are patent. Atherosclerotic irregularity of the M2 and more distal MCA vessels bilaterally. No M2 proximal branch occlusion or high-grade proximal stenosis. The anterior cerebral arteries are patent. No intracranial aneurysm is identified. Posterior circulation: The intracranial vertebral arteries are patent. The basilar artery is patent. The right posterior inferior cerebellar artery and left anterior inferior cerebellar artery are poorly delineated. The posterior cerebral arteries are patent. A left posterior communicating artery is present. The right posterior communicating artery is diminutive or absent. Venous sinuses: The dural venous sinuses are poorly assessed due to contrast timing. Anatomic variants: As described. Review of the MIP images confirms the above findings IMPRESSION: Non-contrast head CT: No evidence of an acute intracranial abnormality. CTA neck: 1. The common carotid and internal carotid arteries are patent within the neck without hemodynamically significant stenosis. Atherosclerotic plaque bilaterally, as described. 2. Vertebral arteries patent within the neck. Venous reflux from a dense right-sided contrast bolus limits evaluation of the V1 and proximal V2 right vertebral artery. Atherosclerotic plaque at the origin of the right vertebral artery, likely resulting in at least moderate stenosis. Moderate atherosclerotic narrowing at the origin of the left vertebral artery. 3.  Aortic Atherosclerosis (ICD10-I70.0). CTA head: 1. The right posterior inferior cerebellar artery and left anterior inferior cerebellar artery are poorly delineated. This may be due to developmentally small vessel size. However, vessel occlusions at  these sites cannot be excluded. Consider a brain MRI for further evaluation. 2. No intracranial large vessel occlusion or proximal high-grade arterial stenosis identified elsewhere. 3. Intracranial atherosclerotic disease, as described. Electronically Signed   By: Jackey Loge D.O.   On: 01/15/2023 15:32   DG Chest Portable 1 View  Result Date: 01/15/2023 CLINICAL DATA:  Fall, dizziness EXAM: PORTABLE CHEST 1 VIEW COMPARISON:  Portable exam 1430 hours compared to 05/17/2022 FINDINGS: Normal heart size, mediastinal contours, and pulmonary vascularity. Lungs clear. No pulmonary infiltrate, pleural effusion, or pneumothorax. No acute osseous findings. IMPRESSION: No acute abnormalities. Electronically  Signed   By: Ulyses Southward M.D.   On: 01/15/2023 14:29   DG Humerus Left  Result Date: 01/15/2023 CLINICAL DATA:  Fall EXAM: LEFT HUMERUS - 2 VIEW COMPARISON:  None Available. FINDINGS: There is no evidence of fracture or other focal bone lesions. Soft tissues are unremarkable. IMPRESSION: Negative. Electronically Signed   By: Wiliam Ke M.D.   On: 01/15/2023 14:28     Assessment & Plan:   Asthma-COPD overlap syndrome - Stable interval; Patient has mild dyspnea and fatigue symptoms with exertion. Recovers easily with rest and able to do all ADLs. No acute respiratory symptoms. Rare SABA use. No oxygen requirements. CXR in April 2024 showed clear lungs.  Needs updated PFTs.  Continue Advair 250 50 mcg 1 puff twice daily and as needed albuterol HFA/nebulizer every 6 hours as needed for breakthrough shortness of breath or wheezing. FU in 6 months or sooner if needed.      Glenford Bayley, NP 02/12/2023

## 2023-02-12 NOTE — Patient Instructions (Addendum)
Recommendations: - Continue Advair one puff twice daily(rinse mouth after use) - Use ipratropium-albuterol nebulizer every 6 hours as needed for shortness of breath/wheezing  - Work on weight loss and stay active as possible   Orders: - PFTs - first available Re: COPD - Nebulizer machine re: COPD   Follow-up: - 6 months with Dr. Jayme Cloud   COPD and Physical Activity Chronic obstructive pulmonary disease (COPD) is a long-term, or chronic, condition that affects the lungs. COPD is a general term that can be used to describe many problems that cause inflammation of the lungs and limit airflow. These conditions include chronic bronchitis and emphysema. The main symptom of COPD is shortness of breath, which makes it harder to do even simple tasks. This can also make it harder to exercise and stay active. Talk with your health care provider about treatments to help you breathe better and actions you can take to prevent breathing problems during physical activity. What are the benefits of exercising when you have COPD? Exercising regularly is an important part of a healthy lifestyle. You can still exercise and do physical activities even though you have COPD. Exercise and physical activity improve your shortness of breath by increasing blood flow (circulation). This causes your heart to pump more oxygen through your body. Moderate exercise can: Improve oxygen use. Increase your energy level. Help with shortness of breath. Strengthen your breathing muscles. Improve heart health. Help with sleep. Improve your self-esteem and feelings of self-worth. Lower depression, stress, and anxiety. Exercise can benefit everyone with COPD. The severity of your disease may affect how hard you can exercise, especially at first, but everyone can benefit. Talk with your health care provider about how much exercise is safe for you, and which activities and exercises are safe for you. What actions can I take to  prevent breathing problems during physical activity? Sign up for a pulmonary rehabilitation program. This type of program may include: Education about lung diseases. Exercise classes that teach you how to exercise and be more active while improving your breathing. This usually involves: Exercise using your lower extremities, such as a stationary bicycle. About 30 minutes of exercise, 2 to 5 times per week, for 6 to 12 weeks. Strength training, such as push-ups or leg lifts. Nutrition education. Group classes in which you can talk with others who also have COPD and learn ways to manage stress. If you use an oxygen tank, you should use it while you exercise. Work with your health care provider to adjust your oxygen for your physical activity. Your resting flow rate is different from your flow rate during physical activity. How to manage your breathing while exercising While you are exercising: Take slow breaths. Pace yourself, and do nottry to go too fast. Purse your lips while breathing out. Pursing your lips is similar to a kissing or whistling position. If doing exercise that uses a quick burst of effort, such as weight lifting: Breathe in before starting the exercise. Breathe out during the hardest part of the exercise, such as raising the weights. Where to find support You can find support for exercising with COPD from: Your health care provider. A pulmonary rehabilitation program. Your local health department or community health programs. Support groups, either online or in-person. Your health care provider may be able to recommend support groups. Where to find more information You can find more information about exercising with COPD from: American Lung Association: lung.org COPD Foundation: copdfoundation.org Contact a health care provider if: Your symptoms  get worse. You have nausea. You have a fever. You want to start a new exercise program or a new activity. Get help right  away if: You have chest pain. You cannot breathe. These symptoms may represent a serious problem that is an emergency. Do not wait to see if the symptoms will go away. Get medical help right away. Call your local emergency services (911 in the [place] Do not drive yourself to the hospital. Summary COPD is a general term that can be used to describe many different lung problems that cause lung inflammation and limit airflow. This includes chronic bronchitis and emphysema. Exercise and physical activity improve your shortness of breath by increasing blood flow (circulation). This causes your heart to provide more oxygen to your body. Contact your health care provider before starting any exercise program or new activity. Ask your health care provider what exercises and activities are safe for you. This information is not intended to replace advice given to you by your health care provider. Make sure you discuss any questions you have with your health care provider. Document Revised: 07/12/2020 Document Reviewed: 07/12/2020 Elsevier Patient Education  2024 [employer].

## 2023-02-12 NOTE — Assessment & Plan Note (Addendum)
-   Stable interval; Patient has mild dyspnea and fatigue symptoms with exertion. Recovers easily with rest and able to do all ADLs. No acute respiratory symptoms. Rare SABA use. No oxygen requirements. CXR in April 2024 showed clear lungs.  Needs updated PFTs.  Continue Advair 250 50 mcg 1 puff twice daily and as needed albuterol HFA/nebulizer every 6 hours as needed for breakthrough shortness of breath or wheezing. FU in 6 months or sooner if needed.

## 2023-02-27 NOTE — Progress Notes (Signed)
Agree with the details of the visit as noted by Elizabeth Walsh, NP.  C. Laura Monet North, MD [facility] 

## 2023-02-27 NOTE — Congregational Nurse Program (Signed)
  Dept: [phone]   Congregational Nurse Program Note  Date of Encounter: 02/27/2023  Clinic visit to check blood pressure and check location of cardiac monitor.  Has one month cardiac monitor in lace that adhesive was coming loose.  Cleaned chest area with alcohol swab then applied tape to top portion of the monitor adhesive that was loose.    BP 128/72, heart rate 61 and irregular, O2 Sat 98%, respirations 14 and regular.  No edema of ankles, denies chest pain, nausea, sweating or shortness of breath.  Has not had any more falls, bruises have healed from fall last month. Past Medical History: Past Medical History:  Diagnosis Date   Allergy    Anxiety    Asthma    Atypical chest pain    Cataract    COPD (chronic obstructive pulmonary disease) (HCC)    Coronary artery disease, non-occlusive    a. LHC 6/08 EF 55-60%, 20-30% proximal LAD, LIs CFX, LIs RCA (Harwani); b. ETT-myoview 11/10, 3' stopped due to fatigue and chest tightness, EF normal, probably normal perfusion images with minimal soft tissue attenuation   Diastolic dysfunction    a. echo 2010: EF 60-65%, GR1DD, no AI, trivial MR, LA nl   GERD (gastroesophageal reflux disease)    Glaucoma    Hyperglycemia    Hyperlipidemia    Hypertension     Encounter Details:  CNP Questionnaire - 02/27/23 1300       Questionnaire   Ask client: Do you give verbal consent for me to treat you today? Yes    Student Assistance [place] Nurse    Location Patient Served  [facility]    Visit Setting with Client Organization    Patient Status Unknown   Has own apartment at [facility];Medicare    Insurance/Financial Assistance Referral N/A    Medication N/A    Medical Provider Yes    Screening Referrals Made N/A    Medical Referrals Made N/A    Medical Appointment Made N/A    Recently w/o PCP, now 1st time PCP visit completed due to CNs referral or appointment made N/A    Food N/A    Transportation Need  transportation assistance    Housing/Utilities N/A    Interpersonal Safety N/A    Interventions Counsel;Advocate/Support;Educate;Reviewed Medications    Abnormal to Normal Screening Since Last CN Visit N/A    Screenings CN Performed Blood Pressure;Pulse Ox    Sent Client to Lab for: N/A    Did client attend any of the following based off CNs referral or appointments made? N/A    ED Visit Averted N/A    Life-Saving Intervention Made N/A

## 2023-03-18 DIAGNOSIS — S52122A Displaced fracture of head of left radius, initial encounter for closed fracture: Secondary | ICD-10-CM | POA: Insufficient documentation

## 2023-03-20 NOTE — Congregational Nurse Program (Signed)
  Dept: [phone]   Congregational Nurse Program Note  Date of Encounter: 03/20/2023  Clinic visit to assist in putting sling to left arm, fell on 03/18/23 and fractured left elbow.  Has left arm wrapped in ace bandage over a temporary cast that was applied at [facility] after the fall on the side walk.   Lt hand warm to touch with minimal swelling, denies numbness or tingling, pain at level 4 for both Lt elbow and right shoulder.  Put sling on for her, checked circulation. Has abrasions both knees, no drainage or open areas.  Called [facility] and scheduled follow-up visit for Friday, July 12th,  Educated regarding symptoms that require immediate MD attention. Past Medical History: Past Medical History:  Diagnosis Date   Allergy    Anxiety    Asthma    Atypical chest pain    Cataract    COPD (chronic obstructive pulmonary disease) (HCC)    Coronary artery disease, non-occlusive    a. LHC 6/08 EF 55-60%, 20-30% proximal LAD, LIs CFX, LIs RCA (Harwani); b. ETT-myoview 11/10, 3' stopped due to fatigue and chest tightness, EF normal, probably normal perfusion images with minimal soft tissue attenuation   Diastolic dysfunction    a. echo 2010: EF 60-65%, GR1DD, no AI, trivial MR, LA nl   GERD (gastroesophageal reflux disease)    Glaucoma    Hyperglycemia    Hyperlipidemia    Hypertension     Encounter Details:  CNP Questionnaire - 03/20/23 1110       Questionnaire   Ask client: Do you give verbal consent for me to treat you today? Yes    Student Assistance N/A    Location Patient Served  [facility]    Visit Setting with Client Organization    Patient Status Unknown   Has own apartment at [facility];Medicare    Insurance/Financial Assistance Referral N/A    Medication N/A    Medical Provider Yes    Screening Referrals Made N/A    Medical Referrals Made Non-[facility]    Medical Appointment Made Non-[facility] PCP/clinic    Recently w/o PCP, now  1st time PCP visit completed due to CNs referral or appointment made N/A    Food N/A    Transportation Need transportation assistance    Housing/Utilities N/A    Interpersonal Safety N/A    Interventions Counsel;Advocate/Support;Educate;Reviewed Medications;Spiritual Care;Navigate Healthcare System;Case Management;Reviewed New Diagnosis    Abnormal to Normal Screening Since Last CN Visit N/A    Screenings CN Performed Blood Pressure;Pulse Ox    Sent Client to Lab for: N/A    Did client attend any of the following based off CNs referral or appointments made? N/A    ED Visit Averted N/A    Life-Saving Intervention Made N/A

## 2023-04-04 ENCOUNTER — Ambulatory Visit: Payer: 59 | Admitting: Internal Medicine

## 2023-04-10 NOTE — Congregational Nurse Program (Signed)
  Dept: [phone]   Congregational Nurse Program Note  Date of Encounter: 04/10/2023  Clinic visit to check blood pressure and circulation left hand from elbow fracture.  BP 112/66, pulse 62 and regular, O2 Sat 96%.  Left fingers warm to touch with circulation WNL, wearing sling left arm, cast was removed last week.  Complains of mild pain right shoulder that worsens at night since she is unable to sleep on left side.  Advised to notify MD if any further increase in pain and pain not relieved by prescribed medication or if any change in circulation. Abrasions both knees from the fall have healed and has scars that are minimal. Past Medical History: Past Medical History:  Diagnosis Date   Allergy    Anxiety    Asthma    Atypical chest pain    Cataract    COPD (chronic obstructive pulmonary disease) (HCC)    Coronary artery disease, non-occlusive    a. LHC 6/08 EF 55-60%, 20-30% proximal LAD, LIs CFX, LIs RCA (Harwani); b. ETT-myoview 11/10, 3' stopped due to fatigue and chest tightness, EF normal, probably normal perfusion images with minimal soft tissue attenuation   Diastolic dysfunction    a. echo 2010: EF 60-65%, GR1DD, no AI, trivial MR, LA nl   GERD (gastroesophageal reflux disease)    Glaucoma    Hyperglycemia    Hyperlipidemia    Hypertension     Encounter Details:

## 2023-04-17 NOTE — Congregational Nurse Program (Signed)
  Dept: [phone]   Congregational Nurse Program Note  Date of Encounter: 04/17/2023  Clinic visit to check blood pressure and circulation left hand.  BP 110/72, pulse 62 and irregular.  Has sling left arm for elbow fracture and brace left wrist for healing fracture.  Continues to have minimal pain right shoulder, right knee and leg abrasions are healed but skin remains discolored. Educated regarding fall precautions and skin care while using brace and sling. Past Medical History: Past Medical History:  Diagnosis Date   Allergy    Anxiety    Asthma    Atypical chest pain    Cataract    COPD (chronic obstructive pulmonary disease) (HCC)    Coronary artery disease, non-occlusive    a. LHC 6/08 EF 55-60%, 20-30% proximal LAD, LIs CFX, LIs RCA (Harwani); b. ETT-myoview 11/10, 3' stopped due to fatigue and chest tightness, EF normal, probably normal perfusion images with minimal soft tissue attenuation   Diastolic dysfunction    a. echo 2010: EF 60-65%, GR1DD, no AI, trivial MR, LA nl   GERD (gastroesophageal reflux disease)    Glaucoma    Hyperglycemia    Hyperlipidemia    Hypertension     Encounter Details:  CNP Questionnaire - 04/17/23 1045       Questionnaire   Ask client: Do you give verbal consent for me to treat you today? Yes    Student Assistance N/A    Location Patient Served  [facility]    Visit Setting with Client Organization    Patient Status Unknown   Has own apartment at [facility];Medicare    Insurance/Financial Assistance Referral N/A    Medication N/A    Medical Provider Yes    Screening Referrals Made N/A    Medical Referrals Made N/A    Medical Appointment Made N/A    Recently w/o PCP, now 1st time PCP visit completed due to CNs referral or appointment made N/A    Food N/A    Transportation Need transportation assistance    Housing/Utilities N/A    Interpersonal Safety N/A    Interventions  Counsel;Advocate/Support;Educate;Reviewed Medications;Spiritual Care    Abnormal to Normal Screening Since Last CN Visit N/A    Screenings CN Performed Blood Pressure;Pulse Ox    Sent Client to Lab for: N/A    Did client attend any of the following based off CNs referral or appointments made? N/A    ED Visit Averted N/A    Life-Saving Intervention Made N/A

## 2023-04-24 NOTE — Congregational Nurse Program (Signed)
  Dept: [phone]   Congregational Nurse Program Note  Date of Encounter: 04/24/2023  Clinic visit to check blood pressure and to do a COVID-19 home test.  BP 110/74, pulse 61 and slightly irregular, O2 Sat 93%, eyes watery and red, has nasal drainage.  COVID-19 test was negative.  No fever or cough and she has history of allergies.  No other acute symptoms noted.  Continues to wear sling left arm for fractured elbow but doesn't have brace on wrist, states pain is minimal, taking Aleve in AM and bedtime. Past Medical History: Past Medical History:  Diagnosis Date   Allergy    Anxiety    Asthma    Atypical chest pain    Cataract    COPD (chronic obstructive pulmonary disease) (HCC)    Coronary artery disease, non-occlusive    a. LHC 6/08 EF 55-60%, 20-30% proximal LAD, LIs CFX, LIs RCA (Harwani); b. ETT-myoview 11/10, 3' stopped due to fatigue and chest tightness, EF normal, probably normal perfusion images with minimal soft tissue attenuation   Diastolic dysfunction    a. echo 2010: EF 60-65%, GR1DD, no AI, trivial MR, LA nl   GERD (gastroesophageal reflux disease)    Glaucoma    Hyperglycemia    Hyperlipidemia    Hypertension     Encounter Details:  CNP Questionnaire - 04/24/23 1130       Questionnaire   Ask client: Do you give verbal consent for me to treat you today? Yes    Student Assistance N/A    Location Patient Served  [facility]    Visit Setting with Client Organization    Patient Status Unknown   Has own apartment at [facility];Medicare    Insurance/Financial Assistance Referral N/A    Medication N/A    Medical Provider Yes    Screening Referrals Made N/A    Medical Referrals Made N/A    Medical Appointment Made N/A    Recently w/o PCP, now 1st time PCP visit completed due to CNs referral or appointment made N/A    Food N/A    Transportation Need transportation assistance    Housing/Utilities N/A    Interpersonal Safety N/A     Interventions Counsel;Advocate/Support;Educate;Reviewed Medications;Spiritual Care    Abnormal to Normal Screening Since Last CN Visit N/A    Screenings CN Performed Blood Pressure;Pulse Ox    Sent Client to Lab for: N/A    Did client attend any of the following based off CNs referral or appointments made? N/A    ED Visit Averted N/A    Life-Saving Intervention Made N/A

## 2023-05-01 DIAGNOSIS — R7303 Prediabetes: Secondary | ICD-10-CM | POA: Diagnosis not present

## 2023-05-01 DIAGNOSIS — R0789 Other chest pain: Secondary | ICD-10-CM | POA: Diagnosis not present

## 2023-05-01 DIAGNOSIS — I25118 Atherosclerotic heart disease of native coronary artery with other forms of angina pectoris: Secondary | ICD-10-CM | POA: Diagnosis not present

## 2023-05-01 DIAGNOSIS — I10 Essential (primary) hypertension: Secondary | ICD-10-CM | POA: Diagnosis not present

## 2023-05-01 DIAGNOSIS — E782 Mixed hyperlipidemia: Secondary | ICD-10-CM | POA: Diagnosis not present

## 2023-05-02 ENCOUNTER — Other Ambulatory Visit: Payer: Self-pay

## 2023-05-02 ENCOUNTER — Other Ambulatory Visit: Payer: Self-pay | Admitting: Internal Medicine

## 2023-05-02 DIAGNOSIS — H16143 Punctate keratitis, bilateral: Secondary | ICD-10-CM | POA: Diagnosis not present

## 2023-05-02 DIAGNOSIS — J302 Other seasonal allergic rhinitis: Secondary | ICD-10-CM

## 2023-05-03 NOTE — Telephone Encounter (Signed)
Requested by interface surescripts. Last refill documented as 04/18/23 for #16g 6 refills. Too soon for refill Requested Prescriptions  Refused Prescriptions Disp Refills   fluticasone (FLONASE) 50 MCG/ACT nasal spray [Pharmacy Med Name: fluticasone propionate 50 mcg/actuation nasal spray,suspension] 16 mL 3    Sig: PLACE 2 SPRAYS INTO BOTH NOSTRILS DAILY     Ear, Nose, and Throat: Nasal Preparations - Corticosteroids Passed - 05/02/2023  7:01 AM      Passed - Valid encounter within last 12 months    Recent Outpatient Visits           3 months ago Hospital discharge follow-up   [facility] Margarita Mail, DO   3 months ago COPD exacerbation [facility])   [facility] Margarita Mail, DO   5 months ago Acute non-recurrent maxillary sinusitis   [facility] Margarita Mail, DO   6 months ago Seasonal allergies   [facility] Margarita Mail, DO   7 months ago Acute non-recurrent frontal sinusitis   [facility] Margarita Mail, DO       Future Appointments             In 4 days Margarita Mail, DO [facility]   In 5 days Allegra Lai, Loel Dubonnet, MD [facility] [place]   In 2 months Margarita Mail, DO [facility], [facility]

## 2023-05-06 ENCOUNTER — Ambulatory Visit: Payer: 59 | Admitting: Gastroenterology

## 2023-05-06 NOTE — Progress Notes (Unsigned)
Established Patient Office Visit  Subjective    Patient ID: Kellie Phelps, female    DOB: [birth year]  Age: 77 y.o. MRN: [id number]  CC:  No chief complaint on file.   HPI Kellie Phelps presents for follow up on chronic medical conditions.   Hypertension/Diastolic HF: -Medications: Amlodipine 5 mg, Metoprolol 25 mg (both held since discharge), Nitroglycerin PRN -Patient is compliant with above medications and reports no side effects. -Checking BP at home (average): 120-130/60-70, stable  -Denies any SOB, CP, vision changes, LE edema or symptoms of hypotension -Follows with cardiology in [place], had a stress test on 10/13/21, EF 62% with small reversible defect of the anteroseptal wall in the apical and mid segments but otherwise normal.  -Last echo 05/12/2019 showing EF 60-65% with left ventricular diastolic impaired relaxation.   HLD/CAD: -Medications: Zocor 40 mg, aspirin 81 mg, fish oil  -Patient is compliant with above medications and reports no side effects.  -Last lipid panel: Lipid Panel     Component Value Date/Time   CHOL 146 01/16/2023 0441   CHOL 155 03/31/2015 1439   TRIG 102 01/16/2023 0441   HDL 47 01/16/2023 0441   HDL 45 03/31/2015 1439   CHOLHDL 3.1 01/16/2023 0441   VLDL 20 01/16/2023 0441   LDLCALC 79 01/16/2023 0441   LDLCALC 75 01/18/2022 1350   LABVLDL 43 (H) 03/31/2015 1439    COPD/Asthma: -Following with [facility] -COPD status: better -Current medications: Advair, Albuterol PRN. -Satisfied with current treatment?: yes -Oxygen use: no -Dyspnea frequency: with exertion occasionally -Cough frequency: no -Rescue inhaler frequency:  about twice a day  -Wheezing: No -Limitation of activity: no -Productive cough: no -Last Spirometry/PFTs: 2019 -Pneumovax: Not up to Date, continues to politely decline -Influenza: Not up to Date  GERD: -Currently on Pantoprazole 40 mg -Following with GI, note reviewed from  03/06/22.  Had EEG on 05/25/2022 which showed diffuse mildly erythematous mucosa without bleeding in the gastric fundus and gastric body.  Biopsy showed esophageal candidiasis and she was treated with oral fluconazole.  Glaucoma: -Currently on Dorzolamide 2% ophthalmic solution, following with Dr. Alvester Morin  -Had both cataracts done in 2023, doing well   Seasonal Allergies: -Currently using Flonase  -Not on an oral antihistamine   Anxiety: -Duration:stable -Currently on Lexapro 10 mg daily  -She is compliant with medications and denies side effects. -Had been on Diazepam in the past, discussed how this medication would not be prescribed here. She was given Hydroxyzine 10 mg at her LOV for as needed anxiety, she took it once and felt very sleepy.      01/22/2023    7:51 AM 01/11/2023    1:10 PM 11/15/2022    9:28 AM 10/30/2022    8:16 AM 09/06/2022    8:24 AM  Depression screen PHQ 2/9  Decreased Interest 0 0 0 0 0  Down, Depressed, Hopeless 0 0 0 0 0  PHQ - 2 Score 0 0 0 0 0  Altered sleeping 0 0 0 0 0  Tired, decreased energy 0 0 0 0 0  Change in appetite 0 0 0 0 0  Feeling bad or failure about yourself  0 0 0 0 0  Trouble concentrating 0 0 0 0 0  Moving slowly or fidgety/restless 0 0 0 0 0  Suicidal thoughts 0 0 0 0 0  PHQ-9 Score 0 0 0 0 0  Difficult doing work/chores Not difficult at all Not difficult at all Not difficult at all Not  difficult at all Not difficult at all    Pre-Diabetes: -A1c 5/24 5.7% -Not currently on medication   Health Maintenance: -Blood work UTD -Mammogram 11/23, Birads-1   Outpatient Encounter Medications as of 05/07/2023  Medication Sig   albuterol (PROAIR HFA) 108 (90 Base) MCG/ACT inhaler Inhale 2 puffs into the lungs every 6 (six) hours as needed. Wheezing or shortness of breath.   amLODipine (NORVASC) 5 MG tablet Take 1 tablet (5 mg total) by mouth daily.   aspirin 81 MG tablet Take 81 mg by mouth daily.   Calcium Carbonate-Vitamin D 600-200  MG-UNIT TABS Take by mouth.   dorzolamide (TRUSOPT) 2 % ophthalmic solution Place 1 drop into both eyes 2 (two) times daily.   escitalopram (LEXAPRO) 10 MG tablet Take 1 tablet (10 mg total) by mouth daily.   fluticasone (FLONASE) 50 MCG/ACT nasal spray Place 2 sprays into both nostrils daily.   fluticasone-salmeterol (ADVAIR) 250-50 MCG/ACT AEPB Inhale 1 puff into the lungs in the morning and at bedtime.   HYDROcodone-acetaminophen (NORCO/VICODIN) 5-325 MG tablet Take 1 tablet by mouth every 6 (six) hours as needed.   ipratropium-albuterol (DUONEB) 0.5-2.5 (3) MG/3ML SOLN Take 3 mLs by nebulization every 6 (six) hours as needed.   meclizine (ANTIVERT) 25 MG tablet Take 1 tablet (25 mg total) by mouth 3 (three) times daily. (Patient not taking: Reported on 02/12/2023)   methocarbamol (ROBAXIN) 500 MG tablet Take 500 mg by mouth 2 (two) times daily as needed.   metoprolol succinate (TOPROL-XL) 25 MG 24 hr tablet Take 1 tablet (25 mg total) by mouth daily.   Multiple Vitamins-Minerals (ONE-A-DAY WOMENS 50 PLUS PO) Take by mouth daily.   nitroGLYCERIN (NITROSTAT) 0.4 MG SL tablet Place 1 tablet (0.4 mg total) under the tongue every 5 (five) minutes as needed for chest pain.   Omega-3 Fatty Acids (FISH OIL) 1000 MG CAPS 1,000 mg. Take two tablets by mouth daily.   pantoprazole (PROTONIX) 40 MG tablet TAKE ONE TABLET BY MOUTH ONCE DAILY   simvastatin (ZOCOR) 40 MG tablet Take 1 tablet (40 mg total) by mouth daily.   triamcinolone (KENALOG) 0.025 % cream Apply 1 Application topically 2 (two) times daily.   VYZULTA 0.024 % SOLN Place 1 drop into both eyes every evening.   No facility-administered encounter medications on file as of 05/07/2023.    Past Medical History:  Diagnosis Date   Allergy    Anxiety    Asthma    Atypical chest pain    Cataract    COPD (chronic obstructive pulmonary disease) (HCC)    Coronary artery disease, non-occlusive    a. LHC 6/08 EF 55-60%, 20-30% proximal LAD, LIs  CFX, LIs RCA (Harwani); b. ETT-myoview 11/10, 3' stopped due to fatigue and chest tightness, EF normal, probably normal perfusion images with minimal soft tissue attenuation   Diastolic dysfunction    a. echo 2010: EF 60-65%, GR1DD, no AI, trivial MR, LA nl   GERD (gastroesophageal reflux disease)    Glaucoma    Hyperglycemia    Hyperlipidemia    Hypertension     Past Surgical History:  Procedure Laterality Date   ESOPHAGOGASTRODUODENOSCOPY (EGD) WITH PROPOFOL N/A 05/25/2022   Procedure: ESOPHAGOGASTRODUODENOSCOPY (EGD) WITH PROPOFOL;  Surgeon: Toney Reil, MD;  Location: [facility] ENDOSCOPY;  Service: Gastroenterology;  Laterality: N/A;  RIDE IS ABOUT 20 MINUTES OUT   TONSILLECTOMY      Family History  Problem Relation Age of Onset   Uterine cancer Mother    Heart  attack Father    Pulmonary embolism Brother    Hepatitis Brother    Coronary artery disease Brother    Coronary artery disease Other        Aunt    Social History   Socioeconomic History   Marital status: Single    Spouse name: Not on file   Number of children: Not on file   Years of education: Not on file   Highest education level: GED or equivalent  Occupational History   Occupation: Retired    Associate Professor: RETIRED  Tobacco Use   Smoking status: Former    Current packs/day: 0.00    Average packs/day: 0.5 packs/day for 5.0 years (2.5 ttl pk-yrs)    Types: Cigarettes    Start date: 09/17/1973    Quit date: 09/17/1978    Years since quitting: 44.6   Smokeless tobacco: Never   Tobacco comments:    unsure how long she smoked for--10/27/2020  Vaping Use   Vaping status: Never Used  Substance and Sexual Activity   Alcohol use: No   Drug use: No   Sexual activity: Not Currently    Partners: Male    Birth control/protection: Post-menopausal  Other Topics Concern   Not on file  Social History Narrative   Divorced   Lives alone   No children   Does not get regular exercise   Social Determinants of Health    Financial Resource Strain: Low Risk  (01/07/2023)   Overall Financial Resource Strain (CARDIA)    Difficulty of Paying Living Expenses: Not hard at all  Food Insecurity: No Food Insecurity (01/07/2023)   Hunger Vital Sign    Worried About Running Out of Food in the Last Year: Never true    Ran Out of Food in the Last Year: Never true  Transportation Needs: No Transportation Needs (01/07/2023)   PRAPARE - Administrator, Civil Service (Medical): No    Lack of Transportation (Non-Medical): No  Physical Activity: Insufficiently Active (01/07/2023)   Exercise Vital Sign    Days of Exercise per Week: 2 days    Minutes of Exercise per Session: 20 min  Stress: No Stress Concern Present (01/07/2023)   [employer] of Occupational Health - Occupational Stress Questionnaire    Feeling of Stress : Only a little  Social Connections: Moderately Integrated (01/07/2023)   Social Connection and Isolation Panel [NHANES]    Frequency of Communication with Friends and Family: Twice a week    Frequency of Social Gatherings with Friends and Family: Once a week    Attends Religious Services: More than 4 times per year    Active Member of [employer] or Organizations: No    Attends Engineer, structural: More than 4 times per year    Marital Status: Widowed  Intimate Partner Violence: Not At Risk (07/05/2022)   Humiliation, Afraid, Rape, and Kick questionnaire    Fear of Current or Ex-Partner: No    Emotionally Abused: No    Physically Abused: No    Sexually Abused: No    Review of Systems  Constitutional:  Negative for chills and fever.  Respiratory:  Negative for cough, shortness of breath and wheezing.   Cardiovascular:  Negative for chest pain.  Neurological:  Positive for dizziness. Negative for headaches.      Objective    There were no vitals taken for this visit.  Physical Exam Constitutional:      Appearance: Normal appearance.  HENT:     Head:  Normocephalic and  atraumatic.  Eyes:     Conjunctiva/sclera: Conjunctivae normal.  Neck:     Vascular: No carotid bruit.  Cardiovascular:     Rate and Rhythm: Normal rate and regular rhythm.  Pulmonary:     Effort: Pulmonary effort is normal.     Breath sounds: Normal breath sounds.  Skin:    General: Skin is warm and dry.  Neurological:     General: No focal deficit present.     Mental Status: She is alert. Mental status is at baseline.  Psychiatric:        Mood and Affect: Mood normal.        Behavior: Behavior normal.       Assessment & Plan:   1. Hospital discharge follow-up/Benign paroxysmal positional vertigo, unspecified laterality: Reviewed hospital discharge summary from 01/17/23 and inpatient imaging and labs. Discussed diagnosis of BPPV, patient states vertigo is improving. Can take Meclizine as needed, printed Epley maneuver exercises for her to try at home. Is doing home PT, not vestibular therapy, will place referral if symptoms do not improve.  2. Essential hypertension: Amlodipine and Metoprolol held while inpatient due to low pressures, blood pressure stable here today and going high at home. Will restart previous medications.   - amLODipine (NORVASC) 5 MG tablet; Take 1 tablet (5 mg total) by mouth daily.  Dispense: 90 tablet - metoprolol succinate (TOPROL-XL) 25 MG 24 hr tablet; Take 1 tablet (25 mg total) by mouth daily.  Dispense: 90 tablet; Refill: 3   No follow-ups on file.   Margarita Mail, DO

## 2023-05-07 ENCOUNTER — Encounter: Payer: Self-pay | Admitting: Internal Medicine

## 2023-05-07 ENCOUNTER — Ambulatory Visit: Payer: 59 | Admitting: Internal Medicine

## 2023-05-07 VITALS — BP 130/68 | HR 68 | Temp 98.2°F | Ht 65.0 in | Wt 210.9 lb

## 2023-05-07 DIAGNOSIS — J069 Acute upper respiratory infection, unspecified: Secondary | ICD-10-CM

## 2023-05-07 DIAGNOSIS — J3489 Other specified disorders of nose and nasal sinuses: Secondary | ICD-10-CM

## 2023-05-07 DIAGNOSIS — I2510 Atherosclerotic heart disease of native coronary artery without angina pectoris: Secondary | ICD-10-CM | POA: Diagnosis not present

## 2023-05-07 DIAGNOSIS — J302 Other seasonal allergic rhinitis: Secondary | ICD-10-CM | POA: Diagnosis not present

## 2023-05-07 DIAGNOSIS — I10 Essential (primary) hypertension: Secondary | ICD-10-CM | POA: Diagnosis not present

## 2023-05-07 DIAGNOSIS — E782 Mixed hyperlipidemia: Secondary | ICD-10-CM

## 2023-05-07 DIAGNOSIS — F419 Anxiety disorder, unspecified: Secondary | ICD-10-CM

## 2023-05-07 DIAGNOSIS — K219 Gastro-esophageal reflux disease without esophagitis: Secondary | ICD-10-CM | POA: Diagnosis not present

## 2023-05-07 DIAGNOSIS — Z87891 Personal history of nicotine dependence: Secondary | ICD-10-CM | POA: Diagnosis not present

## 2023-05-07 MED ORDER — AMLODIPINE BESYLATE 5 MG PO TABS
5.0000 mg | ORAL_TABLET | Freq: Every day | ORAL | 1 refills | Status: DC
Start: 2023-05-07 — End: 2023-12-11

## 2023-05-07 MED ORDER — ESCITALOPRAM OXALATE 10 MG PO TABS
10.0000 mg | ORAL_TABLET | Freq: Every day | ORAL | 1 refills | Status: DC
Start: 2023-05-07 — End: 2023-12-16

## 2023-05-07 MED ORDER — SIMVASTATIN 40 MG PO TABS
40.0000 mg | ORAL_TABLET | Freq: Every day | ORAL | 1 refills | Status: DC
Start: 2023-05-07 — End: 2023-12-16

## 2023-05-07 MED ORDER — LEVOCETIRIZINE DIHYDROCHLORIDE 5 MG PO TABS
5.0000 mg | ORAL_TABLET | Freq: Every evening | ORAL | 1 refills | Status: DC
Start: 2023-05-07 — End: 2023-07-15

## 2023-05-07 MED ORDER — METHYLPREDNISOLONE 4 MG PO TBPK: ORAL_TABLET | ORAL | 0 refills | Status: DC

## 2023-05-07 MED ORDER — PANTOPRAZOLE SODIUM 40 MG PO TBEC
40.0000 mg | DELAYED_RELEASE_TABLET | Freq: Every day | ORAL | 1 refills | Status: DC
Start: 2023-05-07 — End: 2023-06-10

## 2023-05-07 MED ORDER — FLUTICASONE PROPIONATE 50 MCG/ACT NA SUSP
2.0000 | Freq: Every day | NASAL | 6 refills | Status: DC
Start: 2023-05-07 — End: 2023-08-27

## 2023-05-08 ENCOUNTER — Ambulatory Visit: Payer: 59 | Admitting: Gastroenterology

## 2023-05-08 NOTE — Congregational Nurse Program (Signed)
  Dept: [phone]   Congregational Nurse Program Note  Date of Encounter: 05/08/2023  Clinic visit to check blood pressure and circulation in left arm.  Wearing sling for fracture of left elbow an wrist with strain on shoulder. Circulation WNL and having less pain today.  BP 116/70, 98%, pulse 60 and slightly irregular.  Educated regarding circulation checks. Past Medical History: Past Medical History:  Diagnosis Date   Allergy    Anxiety    Asthma    Atypical chest pain    Cataract    COPD (chronic obstructive pulmonary disease) (HCC)    Coronary artery disease, non-occlusive    a. LHC 6/08 EF 55-60%, 20-30% proximal LAD, LIs CFX, LIs RCA (Harwani); b. ETT-myoview 11/10, 3' stopped due to fatigue and chest tightness, EF normal, probably normal perfusion images with minimal soft tissue attenuation   Diastolic dysfunction    a. echo 2010: EF 60-65%, GR1DD, no AI, trivial MR, LA nl   GERD (gastroesophageal reflux disease)    Glaucoma    Hyperglycemia    Hyperlipidemia    Hypertension     Encounter Details:  CNP Questionnaire - 05/08/23 1355       Questionnaire   Ask client: Do you give verbal consent for me to treat you today? Yes    Student Assistance N/A    Location Patient Served  [facility]    Visit Setting with Client Organization    Patient Status Unknown   Has own apartment at [facility];Medicare    Insurance/Financial Assistance Referral N/A    Medication N/A    Medical Provider Yes    Screening Referrals Made N/A    Medical Referrals Made N/A    Medical Appointment Made N/A    Recently w/o PCP, now 1st time PCP visit completed due to CNs referral or appointment made N/A    Food N/A    Transportation Need transportation assistance    Housing/Utilities N/A    Interpersonal Safety N/A    Interventions Counsel;Advocate/Support;Educate;Reviewed Medications;Spiritual Care    Abnormal to Normal Screening Since Last CN Visit N/A     Screenings CN Performed Blood Pressure;Pulse Ox    Sent Client to Lab for: N/A    Did client attend any of the following based off CNs referral or appointments made? N/A    ED Visit Averted N/A    Life-Saving Intervention Made N/A

## 2023-05-10 DIAGNOSIS — S52125A Nondisplaced fracture of head of left radius, initial encounter for closed fracture: Secondary | ICD-10-CM | POA: Diagnosis not present

## 2023-05-29 ENCOUNTER — Telehealth: Payer: Self-pay | Admitting: *Deleted

## 2023-05-29 NOTE — Patient Outreach (Signed)
  Care Coordination   05/29/2023 Name: Kellie Phelps MRN: [id number] DOB: December 28, 1945   Care Coordination Outreach Attempts:  An unsuccessful telephone outreach was attempted today to offer the patient information about available care coordination services.  Follow Up Plan:  Additional outreach attempts will be made to offer the patient care coordination information and services.   Encounter Outcome:  No Answer   Care Coordination Interventions:  No, not indicated    Kellie Livengood, LCSW [place]  [facility], [facility] Licensed Clinical Social Worker Care Coordinator  Direct Dial: [phone]

## 2023-05-30 ENCOUNTER — Telehealth: Payer: Self-pay | Admitting: *Deleted

## 2023-05-30 NOTE — Patient Instructions (Signed)
Visit Information  Thank you for taking time to visit with me today. Please don't hesitate to contact me if I can be of assistance to you.   Following are the goals we discussed today:  CSW will send advanced directive documents to patient for review and completion CSW will provide information regarding emotional support animal and estate planning during next visit   Our next appointment is by telephone on 06/03/23 at 1pm  Please call the care guide team at [phone] if you need to cancel or reschedule your appointment.   If you are experiencing a Mental Health or Behavioral Health Crisis or need someone to talk to, please call 911   Patient verbalizes understanding of instructions and care plan provided today and agrees to view in MyChart. Active MyChart status and patient understanding of how to access instructions and care plan via MyChart confirmed with patient.     Telephone follow up appointment with care management team member scheduled for: 06/03/23  Verna Czech, LCSW [place]  [facility], [facility] Licensed Clinical Social Worker Care Coordinator  Direct Dial: [phone]

## 2023-05-30 NOTE — Patient Outreach (Signed)
  Care Coordination   Initial Visit Note   05/30/2023 Name: Kellie Phelps MRN: [id number] DOB: 1945-12-19  Kellie Phelps is a 77 y.o. year old female who sees Margarita Mail, DO for primary care. I spoke with  Zackery Barefoot by phone today.  What matters to the patients health and wellness today?  Pt interested in completing her Living Will also interested in estate planning, and obtaining a emotional support animal    Goals Addressed   None     SDOH assessments and interventions completed:  Yes  SDOH Interventions Today    Flowsheet Row Most Recent Value  SDOH Interventions   Food Insecurity Interventions Intervention Not Indicated  Housing Interventions Intervention Not Indicated  Transportation Interventions Intervention Not Indicated  Utilities Interventions Intervention Not Indicated        Care Coordination Interventions:  Yes, provided   Follow up plan: Follow up call scheduled for 06/03/23    Encounter Outcome:  Patient Visit Completed

## 2023-05-31 NOTE — Congregational Nurse Program (Signed)
  Dept: [phone]   Congregational Nurse Program Note  Date of Encounter: 05/22/2023  Clinic visit to check blood pressure, BP 110/70, pulse 62 and slightly irregular, O2 Sat 96%.  Wearing sling left arm from previous fractures of elbow and wrist.  States pain is minimal and has no numbness or tingling.  Educated regarding preventing falls to keep from having more injuries. Past Medical History: Past Medical History:  Diagnosis Date   Allergy    Anxiety    Asthma    Atypical chest pain    Cataract    COPD (chronic obstructive pulmonary disease) (HCC)    Coronary artery disease, non-occlusive    a. LHC 6/08 EF 55-60%, 20-30% proximal LAD, LIs CFX, LIs RCA (Harwani); b. ETT-myoview 11/10, 3' stopped due to fatigue and chest tightness, EF normal, probably normal perfusion images with minimal soft tissue attenuation   Diastolic dysfunction    a. echo 2010: EF 60-65%, GR1DD, no AI, trivial MR, LA nl   GERD (gastroesophageal reflux disease)    Glaucoma    Hyperglycemia    Hyperlipidemia    Hypertension     Encounter Details:  CNP Questionnaire - 05/22/23 1025       Questionnaire   Ask client: Do you give verbal consent for me to treat you today? Yes    Student Assistance N/A    Location Patient Served  [facility]    Visit Setting with Client Organization    Patient Status Unknown   Has own apartment at [facility];Medicare    Insurance/Financial Assistance Referral N/A    Medication N/A    Medical Provider Yes    Screening Referrals Made N/A    Medical Referrals Made N/A    Medical Appointment Made N/A    Recently w/o PCP, now 1st time PCP visit completed due to CNs referral or appointment made N/A    Food N/A    Transportation Need transportation assistance    Housing/Utilities N/A    Interpersonal Safety N/A    Interventions Counsel;Advocate/Support;Educate;Spiritual Care    Abnormal to Normal Screening Since Last CN Visit N/A    Screenings  CN Performed Blood Pressure;Pulse Ox    Sent Client to Lab for: N/A    Did client attend any of the following based off CNs referral or appointments made? N/A    ED Visit Averted N/A    Life-Saving Intervention Made N/A

## 2023-06-03 ENCOUNTER — Telehealth: Payer: Self-pay | Admitting: *Deleted

## 2023-06-03 ENCOUNTER — Encounter: Payer: Self-pay | Admitting: *Deleted

## 2023-06-03 NOTE — Patient Outreach (Signed)
Care Coordination   Documentation  Visit Note   06/03/2023 Name: Kellie Phelps MRN: [id number] DOB: [birth year]  Kellie Phelps is a 77 y.o. year old female who sees Margarita Mail, DO for primary care.  What matters to the patients health and wellness today?  Patient interested in additional information related to completion of her Will and obtaining a emotional support animal.   Goals Addressed             This Visit's Progress    care coordination activities       Interventions Today    Flowsheet Row Most Recent Value  General Interventions   General Interventions Discussed/Reviewed [employer] process of obtaining a emotional support animal through [facility]]  Advanced Directive Interventions   Advanced Directives Discussed/Reviewed --  [confirmed that patient's Will could be drafted independently, having forms notorized is recommended]              SDOH assessments and interventions completed:  No     Care Coordination Interventions:  Yes, provided   Follow up plan: Follow up call scheduled for 06/03/23    Encounter Outcome:  Patient Visit Completed

## 2023-06-03 NOTE — Patient Outreach (Signed)
Care Coordination   06/03/2023 Name: Kellie Phelps MRN: [id number] DOB: [birth year]   Care Coordination Outreach Attempts:  A third unsuccessful outreach was attempted today to offer the patient with information about available care coordination services.  Follow Up Plan:  Additional outreach attempts will be made to offer the patient care coordination information and services.   Encounter Outcome:  No Answer   Care Coordination Interventions:  No, not indicated     Maudene Stotler, LCSW [place]  [facility], [facility] Licensed Clinical Social Worker Care Coordinator  Direct Dial: [phone]

## 2023-06-04 ENCOUNTER — Telehealth: Payer: Self-pay | Admitting: Gastroenterology

## 2023-06-04 ENCOUNTER — Telehealth: Payer: Self-pay | Admitting: *Deleted

## 2023-06-04 NOTE — Telephone Encounter (Signed)
Patient called in to schedule an office visit.

## 2023-06-04 NOTE — Patient Outreach (Signed)
Care Coordination   06/04/2023 Name: Kellie Phelps MRN: [id number] DOB: February 22, 1946   Care Coordination Outreach Attempts:  A second unsuccessful outreach was attempted today to offer the patient with information about available care coordination services.  Follow Up Plan:  Additional outreach attempts will be made to offer the patient care coordination information and services.   Encounter Outcome:  No Answer   Care Coordination Interventions:  No, not indicated    Charday Capetillo, LCSW [place]  [facility], [facility] Licensed Clinical Social Worker Care Coordinator  Direct Dial: [phone]

## 2023-06-05 ENCOUNTER — Other Ambulatory Visit: Payer: Self-pay

## 2023-06-07 DIAGNOSIS — S52125A Nondisplaced fracture of head of left radius, initial encounter for closed fracture: Secondary | ICD-10-CM | POA: Diagnosis not present

## 2023-06-10 ENCOUNTER — Ambulatory Visit (INDEPENDENT_AMBULATORY_CARE_PROVIDER_SITE_OTHER): Payer: 59 | Admitting: Physician Assistant

## 2023-06-10 ENCOUNTER — Encounter: Payer: Self-pay | Admitting: Physician Assistant

## 2023-06-10 VITALS — BP 113/74 | HR 65 | Temp 98.0°F | Ht 65.0 in | Wt 214.0 lb

## 2023-06-10 DIAGNOSIS — Z1211 Encounter for screening for malignant neoplasm of colon: Secondary | ICD-10-CM

## 2023-06-10 DIAGNOSIS — K219 Gastro-esophageal reflux disease without esophagitis: Secondary | ICD-10-CM | POA: Diagnosis not present

## 2023-06-10 MED ORDER — FAMOTIDINE 40 MG PO TABS
40.0000 mg | ORAL_TABLET | Freq: Every day | ORAL | 3 refills | Status: DC
Start: 2023-06-10 — End: 2023-08-27

## 2023-06-10 MED ORDER — ESOMEPRAZOLE MAGNESIUM 40 MG PO CPDR
40.0000 mg | DELAYED_RELEASE_CAPSULE | Freq: Every day | ORAL | 3 refills | Status: DC
Start: 2023-06-10 — End: 2023-08-27

## 2023-06-10 MED ORDER — PEG 3350-KCL-NA BICARB-NACL 420 G PO SOLR: 4000.0000 mL | Freq: Once | ORAL | 0 refills | Status: AC

## 2023-06-10 NOTE — Progress Notes (Signed)
Celso Amy, PA-C [address]  [address]  [place], [place] [address]  Main: [phone]  Fax: [phone]   Primary Care Physician: Margarita Mail, DO  Primary Gastroenterologist:  Celso Amy, PA-C / Dr. Lannette Donath    CC: GERD  HPI: Kellie Phelps is a 77 y.o. female is self referred to evaluate acid reflux.  Patient reports increasing burning in her mid chest up into her throat from acid for several months.  She is taking pantoprazole 40 mg once daily which is not controlling her reflux.  She denies dysphagia, odynophagia, or any alarm symptoms.  Last EGD done by Dr. Allegra Lai 05/2022 showed mild gastritis and white nummular lesions in the middle third of esophagus.  Otherwise normal.  Gastric biopsies were negative for H. pylori, intestinal metaplasia, or dysplasia.  Esophageal biopsies were positive for candidiasis.  She was treated with Fluconazole 200mg  daily x 14 days.  Patient last saw Dr. Allegra Lai for office visit 02/2022 to follow-up with chronic GERD.  Currently takes pantoprazole.    She was scheduled for screening colonoscopy, however patient canceled the colonoscopy procedure.  NO Previous Colonoscopy.  She agrees to reschedule colonoscopy today.  Current Outpatient Medications  Medication Sig Dispense Refill   albuterol (PROAIR HFA) 108 (90 Base) MCG/ACT inhaler Inhale 2 puffs into the lungs every 6 (six) hours as needed. Wheezing or shortness of breath. 18 g 10   amLODipine (NORVASC) 5 MG tablet Take 1 tablet (5 mg total) by mouth daily. 90 tablet 1   aspirin 81 MG tablet Take 81 mg by mouth daily.     Calcium Carbonate-Vitamin D 600-200 MG-UNIT TABS Take by mouth.     cetirizine (ZYRTEC) 10 MG tablet Take 1 tablet by mouth daily.     dorzolamide (TRUSOPT) 2 % ophthalmic solution Place 1 drop into both eyes 2 (two) times daily.     escitalopram (LEXAPRO) 10 MG tablet Take 1 tablet (10 mg total) by mouth daily. 90 tablet 1   esomeprazole (NEXIUM) 40  MG capsule Take 1 capsule (40 mg total) by mouth daily before breakfast. 90 capsule 3   famotidine (PEPCID) 40 MG tablet Take 1 tablet (40 mg total) by mouth at bedtime. 90 tablet 3   fluticasone (FLONASE) 50 MCG/ACT nasal spray Place 2 sprays into both nostrils daily. 16 g 6   fluticasone-salmeterol (ADVAIR) 250-50 MCG/ACT AEPB Inhale 1 puff into the lungs in the morning and at bedtime. 180 each 1   ipratropium-albuterol (DUONEB) 0.5-2.5 (3) MG/3ML SOLN Take 3 mLs by nebulization every 6 (six) hours as needed. 360 mL 6   levocetirizine (XYZAL ALLERGY 24HR) 5 MG tablet Take 1 tablet (5 mg total) by mouth every evening. 90 tablet 1   metoprolol succinate (TOPROL-XL) 25 MG 24 hr tablet Take 1 tablet (25 mg total) by mouth daily. 90 tablet 3   Multiple Vitamins-Minerals (ONE-A-DAY WOMENS 50 PLUS PO) Take by mouth daily.     nitroGLYCERIN (NITROSTAT) 0.4 MG SL tablet Place 1 tablet (0.4 mg total) under the tongue every 5 (five) minutes as needed for chest pain. 30 tablet 1   Omega-3 Fatty Acids (FISH OIL) 1000 MG CAPS 1,000 mg. Take two tablets by mouth daily.     polyethylene glycol-electrolytes (NULYTELY) 420 g solution Take 4,000 mLs by mouth once for 1 dose. Use as directed for your colonoscopy preparation 4000 mL 0   simvastatin (ZOCOR) 40 MG tablet Take 1 tablet (40 mg total) by mouth daily. 90 tablet  1   triamcinolone (KENALOG) 0.025 % cream Apply 1 Application topically 2 (two) times daily.     VYZULTA 0.024 % SOLN Place 1 drop into both eyes every evening.     meclizine (ANTIVERT) 25 MG tablet Take 1 tablet (25 mg total) by mouth 3 (three) times daily. 30 tablet 0   No current facility-administered medications for this visit.    Allergies as of 06/10/2023 - Review Complete 06/10/2023  Allergen Reaction Noted   Penicillins Hives and Other (See Comments) 06/05/2023    Past Medical History:  Diagnosis Date   Allergy    Anxiety    Asthma    Atypical chest pain    Cataract    COPD  (chronic obstructive pulmonary disease) (HCC)    Coronary artery disease, non-occlusive    a. LHC 6/08 EF 55-60%, 20-30% proximal LAD, LIs CFX, LIs RCA (Harwani); b. ETT-myoview 11/10, 3' stopped due to fatigue and chest tightness, EF normal, probably normal perfusion images with minimal soft tissue attenuation   Diastolic dysfunction    a. echo 2010: EF 60-65%, GR1DD, no AI, trivial MR, LA nl   GERD (gastroesophageal reflux disease)    Glaucoma    Hyperglycemia    Hyperlipidemia    Hypertension     Past Surgical History:  Procedure Laterality Date   ESOPHAGOGASTRODUODENOSCOPY (EGD) WITH PROPOFOL N/A 05/25/2022   Procedure: ESOPHAGOGASTRODUODENOSCOPY (EGD) WITH PROPOFOL;  Surgeon: Toney Reil, MD;  Location: [facility] ENDOSCOPY;  Service: Gastroenterology;  Laterality: N/A;  RIDE IS ABOUT 20 MINUTES OUT   TONSILLECTOMY      Review of Systems:    All systems reviewed and negative except where noted in HPI.   Physical Examination:   BP 113/74   Pulse 65   Temp 98 F (36.7 C)   Ht 5\' 5"  (1.651 m)   Wt 214 lb (97.1 kg)   BMI 35.61 kg/m   General: Well-nourished, well-developed in no acute distress.  Lungs: Clear to auscultation bilaterally. Non-labored. Heart: Regular rate and rhythm, no murmurs rubs or gallops.  Abdomen: Bowel sounds are normal; Abdomen is Soft; No hepatosplenomegaly, masses or hernias;  No Abdominal Tenderness; No guarding or rebound tenderness. Neuro: Alert and oriented x 3.  Grossly intact.  Psych: Alert and cooperative, normal mood and affect.   Imaging Studies: No results found.  Assessment and Plan:   Kellie Phelps is a 77 y.o. y/o female returns for follow-up of chronic GERD.  Last EGD 05/2022 showed nonerosive gastritis and esophageal candidiasis.  Biopsies negative for H. pylori.  No evidence of Barrett's.  1.  Chronic GERD - Not controlled on Pantoprazole.  Stop Pantoprazole.  Start Esomeprazole (Nexium) 40mg  1 tab QAM 30 min.  Before breakfast.  Start Famotidine 40mg  1 tab before bedtime.  Recommend Lifestyle Modifications to prevent Acid Reflux.  Rec. Avoid coffee, sodas, peppermint, citrus fruits, and spicey foods.  Avoid eating 2-3 hours before bedtime.   2.  Colon cancer screening - No previous colonoscopy  Scheduling Colonoscopy I discussed risks of colonoscopy with patient to include risk of bleeding, colon perforation, and risk of sedation.  Patient expressed understanding and agrees to proceed with colonoscopy.   Celso Amy, PA-C  Follow up 4 months with TG.

## 2023-06-21 ENCOUNTER — Telehealth: Payer: Self-pay | Admitting: Pulmonary Disease

## 2023-06-21 NOTE — Telephone Encounter (Signed)
Per Kellie Phelps, she spoke with patient and scheduled PFT.  Nothing further needed.

## 2023-06-21 NOTE — Telephone Encounter (Signed)
Patient is returning phone call. Patient phone number is [phone].

## 2023-07-05 ENCOUNTER — Ambulatory Visit: Payer: 59 | Admitting: Physician Assistant

## 2023-07-08 ENCOUNTER — Telehealth: Payer: Self-pay

## 2023-07-08 NOTE — Telephone Encounter (Signed)
Per pt phone call would like to reschedule procedure.

## 2023-07-09 ENCOUNTER — Telehealth: Payer: Self-pay

## 2023-07-09 ENCOUNTER — Telehealth: Payer: Self-pay | Admitting: Physician Assistant

## 2023-07-09 NOTE — Telephone Encounter (Signed)
Patient called in to reschedule her procedure that's on the 07/17/23 she will be out town.

## 2023-07-09 NOTE — Telephone Encounter (Signed)
Spoke with patient rescheduled colonoscopy to 09/22/22 with dr.Vanga- per patient request.

## 2023-07-11 ENCOUNTER — Ambulatory Visit: Payer: 59

## 2023-07-12 DIAGNOSIS — J301 Allergic rhinitis due to pollen: Secondary | ICD-10-CM | POA: Diagnosis not present

## 2023-07-12 DIAGNOSIS — H6123 Impacted cerumen, bilateral: Secondary | ICD-10-CM | POA: Diagnosis not present

## 2023-07-12 DIAGNOSIS — M26643 Arthritis of bilateral temporomandibular joint: Secondary | ICD-10-CM | POA: Diagnosis not present

## 2023-07-12 DIAGNOSIS — H9201 Otalgia, right ear: Secondary | ICD-10-CM | POA: Diagnosis not present

## 2023-07-12 NOTE — Progress Notes (Unsigned)
Established Patient Office Visit  Subjective    Patient ID: Kellie Phelps, female    DOB: 10-06-1945  Age: 77 y.o. MRN: [id number]  CC:  No chief complaint on file.   HPI Kellie Phelps presents for follow up on chronic medical conditions.   Hypertension/Diastolic HF: -Medications: Amlodipine 5 mg, Metoprolol 25 mg, Nitroglycerin PRN -Patient is compliant with above medications and reports no side effects. -Checking BP at home (average): 120-130/60-70, stable  -Denies any SOB, CP, vision changes, LE edema or symptoms of hypotension -Follows with cardiology in [place], had a stress test on 10/13/21, EF 62% with small reversible defect of the anteroseptal wall in the apical and mid segments but otherwise normal.  -Last echo 05/12/2019 showing EF 60-65% with left ventricular diastolic impaired relaxation.   HLD/CAD: -Medications: Zocor 40 mg, aspirin 81 mg, fish oil  -Patient is compliant with above medications and reports no side effects.  -Last lipid panel: Lipid Panel     Component Value Date/Time   CHOL 146 01/16/2023 0441   CHOL 155 03/31/2015 1439   TRIG 102 01/16/2023 0441   HDL 47 01/16/2023 0441   HDL 45 03/31/2015 1439   CHOLHDL 3.1 01/16/2023 0441   VLDL 20 01/16/2023 0441   LDLCALC 79 01/16/2023 0441   LDLCALC 75 01/18/2022 1350   LABVLDL 43 (H) 03/31/2015 1439    COPD/Asthma: -Following with [facility] -COPD status: better -Current medications: Advair, Albuterol PRN. -Satisfied with current treatment?: yes -Oxygen use: no -Dyspnea frequency: with exertion occasionally -Cough frequency: no -Rescue inhaler frequency:  about twice a day  -Wheezing: No -Limitation of activity: no -Productive cough: no -Last Spirometry/PFTs: 2019 -Pneumovax: Not up to Date, continues to politely decline -Influenza: Not up to Date  GERD: -Currently on Pantoprazole 40 mg -Following with GI, note reviewed from 03/06/22.  Had EEG on 05/25/2022  which showed diffuse mildly erythematous mucosa without bleeding in the gastric fundus and gastric body.  Biopsy showed esophageal candidiasis and she was treated with oral fluconazole.  Glaucoma: -Currently on Dorzolamide 2% ophthalmic solution, following with Dr. Alvester Morin  -Had both cataracts done in 2023, doing well   Seasonal Allergies: -Currently using Flonase and Claritin   Anxiety: -Duration:stable -Currently on Lexapro 10 mg daily  -She is compliant with medications and denies side effects. -Had been on Diazepam in the past, discussed how this medication would not be prescribed here. She was given Hydroxyzine 10 mg at her LOV for as needed anxiety, she took it once and felt very sleepy.      05/30/2023    4:00 PM 05/07/2023    8:10 AM 01/22/2023    7:51 AM 01/11/2023    1:10 PM 11/15/2022    9:28 AM  Depression screen PHQ 2/9  Decreased Interest 0 0 0 0 0  Down, Depressed, Hopeless 0 0 0 0 0  PHQ - 2 Score 0 0 0 0 0  Altered sleeping  0 0 0 0  Tired, decreased energy  0 0 0 0  Change in appetite  0 0 0 0  Feeling bad or failure about yourself   0 0 0 0  Trouble concentrating  0 0 0 0  Moving slowly or fidgety/restless  0 0 0 0  Suicidal thoughts  0 0 0 0  PHQ-9 Score  0 0 0 0  Difficult doing work/chores  Not difficult at all Not difficult at all Not difficult at all Not difficult at all    Pre-Diabetes: -  A1c 5/24 5.7% -Not currently on medication   Health Maintenance: -Blood work UTD -Mammogram 11/23, Birads-1   Outpatient Encounter Medications as of 07/15/2023  Medication Sig   albuterol (PROAIR HFA) 108 (90 Base) MCG/ACT inhaler Inhale 2 puffs into the lungs every 6 (six) hours as needed. Wheezing or shortness of breath.   amLODipine (NORVASC) 5 MG tablet Take 1 tablet (5 mg total) by mouth daily.   aspirin 81 MG tablet Take 81 mg by mouth daily.   Calcium Carbonate-Vitamin D 600-200 MG-UNIT TABS Take by mouth.   cetirizine (ZYRTEC) 10 MG tablet Take 1 tablet by  mouth daily.   dorzolamide (TRUSOPT) 2 % ophthalmic solution Place 1 drop into both eyes 2 (two) times daily.   escitalopram (LEXAPRO) 10 MG tablet Take 1 tablet (10 mg total) by mouth daily.   esomeprazole (NEXIUM) 40 MG capsule Take 1 capsule (40 mg total) by mouth daily before breakfast.   famotidine (PEPCID) 40 MG tablet Take 1 tablet (40 mg total) by mouth at bedtime.   fluticasone (FLONASE) 50 MCG/ACT nasal spray Place 2 sprays into both nostrils daily.   fluticasone-salmeterol (ADVAIR) 250-50 MCG/ACT AEPB Inhale 1 puff into the lungs in the morning and at bedtime.   ipratropium-albuterol (DUONEB) 0.5-2.5 (3) MG/3ML SOLN Take 3 mLs by nebulization every 6 (six) hours as needed.   levocetirizine (XYZAL ALLERGY 24HR) 5 MG tablet Take 1 tablet (5 mg total) by mouth every evening.   meclizine (ANTIVERT) 25 MG tablet Take 1 tablet (25 mg total) by mouth 3 (three) times daily.   metoprolol succinate (TOPROL-XL) 25 MG 24 hr tablet Take 1 tablet (25 mg total) by mouth daily.   Multiple Vitamins-Minerals (ONE-A-DAY WOMENS 50 PLUS PO) Take by mouth daily.   nitroGLYCERIN (NITROSTAT) 0.4 MG SL tablet Place 1 tablet (0.4 mg total) under the tongue every 5 (five) minutes as needed for chest pain.   Omega-3 Fatty Acids (FISH OIL) 1000 MG CAPS 1,000 mg. Take two tablets by mouth daily.   simvastatin (ZOCOR) 40 MG tablet Take 1 tablet (40 mg total) by mouth daily.   triamcinolone (KENALOG) 0.025 % cream Apply 1 Application topically 2 (two) times daily.   VYZULTA 0.024 % SOLN Place 1 drop into both eyes every evening.   No facility-administered encounter medications on file as of 07/15/2023.    Past Medical History:  Diagnosis Date   Allergy    Anxiety    Asthma    Atypical chest pain    Cataract    COPD (chronic obstructive pulmonary disease) (HCC)    Coronary artery disease, non-occlusive    a. LHC 6/08 EF 55-60%, 20-30% proximal LAD, LIs CFX, LIs RCA ([place]); b. ETT-myoview 11/10, 3' stopped  due to fatigue and chest tightness, EF normal, probably normal perfusion images with minimal soft tissue attenuation   Diastolic dysfunction    a. echo 2010: EF 60-65%, GR1DD, no AI, trivial MR, LA nl   GERD (gastroesophageal reflux disease)    Glaucoma    Hyperglycemia    Hyperlipidemia    Hypertension     Past Surgical History:  Procedure Laterality Date   ESOPHAGOGASTRODUODENOSCOPY (EGD) WITH PROPOFOL N/A 05/25/2022   Procedure: ESOPHAGOGASTRODUODENOSCOPY (EGD) WITH PROPOFOL;  Surgeon: Toney Reil, MD;  Location: [facility] ENDOSCOPY;  Service: Gastroenterology;  Laterality: N/A;  RIDE IS ABOUT 20 MINUTES OUT   TONSILLECTOMY      Family History  Problem Relation Age of Onset   Uterine cancer Mother    Heart  attack Father    Pulmonary embolism Brother    Hepatitis Brother    Coronary artery disease Brother    Coronary artery disease Other        Aunt    Social History   Socioeconomic History   Marital status: Single    Spouse name: Not on file   Number of children: Not on file   Years of education: Not on file   Highest education level: GED or equivalent  Occupational History   Occupation: Retired    Associate Professor: RETIRED  Tobacco Use   Smoking status: Former    Current packs/day: 0.00    Average packs/day: 0.5 packs/day for 5.0 years (2.5 ttl pk-yrs)    Types: Cigarettes    Start date: 09/17/1973    Quit date: 09/17/1978    Years since quitting: 44.8   Smokeless tobacco: Never   Tobacco comments:    unsure how long she smoked for--10/27/2020  Vaping Use   Vaping status: Never Used  Substance and Sexual Activity   Alcohol use: No   Drug use: No   Sexual activity: Not Currently    Partners: Male    Birth control/protection: Post-menopausal  Other Topics Concern   Not on file  Social History Narrative   Divorced   Lives alone   No children   Does not get regular exercise   Social Determinants of Health   Financial Resource Strain: Low Risk  (01/07/2023)    Overall Financial Resource Strain (CARDIA)    Difficulty of Paying Living Expenses: Not hard at all  Food Insecurity: No Food Insecurity (05/30/2023)   Hunger Vital Sign    Worried About Running Out of Food in the Last Year: Never true    Ran Out of Food in the Last Year: Never true  Transportation Needs: No Transportation Needs (05/30/2023)   PRAPARE - Administrator, Civil Service (Medical): No    Lack of Transportation (Non-Medical): No  Physical Activity: Insufficiently Active (01/07/2023)   Exercise Vital Sign    Days of Exercise per Week: 2 days    Minutes of Exercise per Session: 20 min  Stress: No Stress Concern Present (01/07/2023)   [employer] of Occupational Health - Occupational Stress Questionnaire    Feeling of Stress : Only a little  Social Connections: Moderately Isolated (01/07/2023)   Social Connection and Isolation Panel [NHANES]    Frequency of Communication with Friends and Family: Twice a week    Frequency of Social Gatherings with Friends and Family: Once a week    Attends Religious Services: More than 4 times per year    Active Member of [employer] or Organizations: No    Attends Banker Meetings: Not on file    Marital Status: Widowed  Intimate Partner Violence: Not At Risk (07/05/2022)   Humiliation, Afraid, Rape, and Kick questionnaire    Fear of Current or Ex-Partner: No    Emotionally Abused: No    Physically Abused: No    Sexually Abused: No    Review of Systems  Constitutional:  Negative for chills and fever.  HENT:  Negative for congestion, ear pain and sore throat.   Respiratory:  Negative for cough, shortness of breath and wheezing.   Cardiovascular:  Negative for chest pain.  Neurological:  Negative for headaches.      Objective    There were no vitals taken for this visit.  Physical Exam Constitutional:      Appearance: Normal appearance.  HENT:     Head: Normocephalic and atraumatic.     Right Ear: Ear canal  and external ear normal. There is impacted cerumen.     Left Ear: Ear canal and external ear normal. There is impacted cerumen.     Nose: Nose normal.     Mouth/Throat:     Mouth: Mucous membranes are moist.     Pharynx: Oropharynx is clear.  Eyes:     Conjunctiva/sclera: Conjunctivae normal.  Cardiovascular:     Rate and Rhythm: Normal rate and regular rhythm.  Pulmonary:     Effort: Pulmonary effort is normal.     Breath sounds: Normal breath sounds.  Skin:    General: Skin is warm and dry.  Neurological:     General: No focal deficit present.     Mental Status: She is alert. Mental status is at baseline.  Psychiatric:        Mood and Affect: Mood normal.        Behavior: Behavior normal.       Assessment & Plan:   1. Viral upper respiratory tract infection/Sinus pressure: Will treat with steroid pack, continue Flonase which was refilled.  - fluticasone (FLONASE) 50 MCG/ACT nasal spray; Place 2 sprays into both nostrils daily.  Dispense: 16 g; Refill: 6 - methylPREDNISolone (MEDROL DOSEPAK) 4 MG TBPK tablet; Day 1: Take 8 mg (2 tablets) before breakfast, 4 mg (1 tablet) after lunch, 4 mg (1 tablet) after supper, and 8 mg (2 tablets) at bedtime. Day 2:Take 4 mg (1 tablet) before breakfast, 4 mg (1 tablet) after lunch, 4 mg (1 tablet) after supper, and 8 mg (2 tablets) at bedtime. Day 3: Take 4 mg (1 tablet) before breakfast, 4 mg (1 tablet) after lunch, 4 mg (1 tablet) after supper, and 4 mg (1 tablet) at bedtime. Day 4: Take 4 mg (1 tablet) before breakfast, 4 mg (1 tablet) after lunch, and 4 mg (1 tablet) at bedtime. Day 5: Take 4 mg (1 tablet) before breakfast and 4 mg (1 tablet) at bedtime. Day 6: Take 4 mg (1 tablet) before breakfast.  Dispense: 1 each; Refill: 0  2. Seasonal allergies: Switch from Claritin to Xyzal, continue Flonase.   - fluticasone (FLONASE) 50 MCG/ACT nasal spray; Place 2 sprays into both nostrils daily.  Dispense: 16 g; Refill: 6 - levocetirizine (XYZAL  ALLERGY 24HR) 5 MG tablet; Take 1 tablet (5 mg total) by mouth every evening.  Dispense: 90 tablet; Refill: 1  3. Essential hypertension: Stable, no changes to medications made and appropriate refills ordered.   - amLODipine (NORVASC) 5 MG tablet; Take 1 tablet (5 mg total) by mouth daily.  Dispense: 90 tablet; Refill: 1  4. Mixed hyperlipidemia/Coronary artery disease, non-occlusive: Stable, continue Zocor, refilled.  - simvastatin (ZOCOR) 40 MG tablet; Take 1 tablet (40 mg total) by mouth daily.  Dispense: 90 tablet; Refill: 1  5. Gastroesophageal reflux disease, unspecified whether esophagitis present: Symptoms stable, refill Protonix.   - pantoprazole (PROTONIX) 40 MG tablet; Take 1 tablet (40 mg total) by mouth daily.  Dispense: 90 tablet; Refill: 1  6. Anxiety: Continues to do well, refill Lexapro 10 mg.  - escitalopram (LEXAPRO) 10 MG tablet; Take 1 tablet (10 mg total) by mouth daily.  Dispense: 90 tablet; Refill: 1   No follow-ups on file.   Margarita Mail, DO

## 2023-07-15 ENCOUNTER — Encounter: Payer: Self-pay | Admitting: Internal Medicine

## 2023-07-15 ENCOUNTER — Ambulatory Visit (INDEPENDENT_AMBULATORY_CARE_PROVIDER_SITE_OTHER): Payer: 59 | Admitting: Internal Medicine

## 2023-07-15 VITALS — BP 116/62 | HR 71 | Temp 98.0°F | Resp 18 | Ht 65.0 in | Wt 219.0 lb

## 2023-07-15 DIAGNOSIS — J432 Centrilobular emphysema: Secondary | ICD-10-CM | POA: Diagnosis not present

## 2023-07-15 DIAGNOSIS — J302 Other seasonal allergic rhinitis: Secondary | ICD-10-CM | POA: Diagnosis not present

## 2023-07-15 DIAGNOSIS — E782 Mixed hyperlipidemia: Secondary | ICD-10-CM

## 2023-07-15 DIAGNOSIS — R7303 Prediabetes: Secondary | ICD-10-CM

## 2023-07-15 DIAGNOSIS — J3489 Other specified disorders of nose and nasal sinuses: Secondary | ICD-10-CM | POA: Diagnosis not present

## 2023-07-15 DIAGNOSIS — I10 Essential (primary) hypertension: Secondary | ICD-10-CM | POA: Diagnosis not present

## 2023-07-15 DIAGNOSIS — F419 Anxiety disorder, unspecified: Secondary | ICD-10-CM

## 2023-07-15 LAB — POCT GLYCOSYLATED HEMOGLOBIN (HGB A1C): Hemoglobin A1C: 5.7 % — AB (ref 4.0–5.6)

## 2023-07-15 MED ORDER — FLUTICASONE-SALMETEROL 250-50 MCG/ACT IN AEPB: 1.0000 | INHALATION_SPRAY | Freq: Two times a day (BID) | RESPIRATORY_TRACT | 1 refills | Status: DC

## 2023-07-15 MED ORDER — CETIRIZINE HCL 10 MG PO TABS
10.0000 mg | ORAL_TABLET | Freq: Every day | ORAL | 1 refills | Status: DC
Start: 2023-07-15 — End: 2023-08-27

## 2023-07-15 MED ORDER — GUAIFENESIN ER 600 MG PO TB12
600.0000 mg | ORAL_TABLET | Freq: Every day | ORAL | 0 refills | Status: DC | PRN
Start: 2023-07-15 — End: 2023-08-27

## 2023-07-15 NOTE — Telephone Encounter (Signed)
Just do otc?

## 2023-07-15 NOTE — Telephone Encounter (Signed)
Copied from CRM [id number]. Topic: General - Other >> Jul 15, 2023  3:22 PM Everette C wrote: Reason for CRM: The patient has been directed by their pharmacy to contact their PCP and request alternative prescriptions for guaiFENesin (MUCINEX) 600 MG 12 hr tablet [[id number]] and cetirizine (ZYRTEC) 10 MG tablet [[id number]]  The patient has been told that their insurance will not cover the medications   Please contact the patient further if needed

## 2023-07-16 NOTE — Telephone Encounter (Signed)
Pt advised to do OTC Zyrtec and Mucinex as per note. Pt verbalized understanding.

## 2023-07-22 ENCOUNTER — Ambulatory Visit: Payer: 59 | Admitting: Pulmonary Disease

## 2023-07-24 DIAGNOSIS — S52125A Nondisplaced fracture of head of left radius, initial encounter for closed fracture: Secondary | ICD-10-CM | POA: Diagnosis not present

## 2023-08-05 DIAGNOSIS — I10 Essential (primary) hypertension: Secondary | ICD-10-CM | POA: Diagnosis not present

## 2023-08-05 DIAGNOSIS — E782 Mixed hyperlipidemia: Secondary | ICD-10-CM | POA: Diagnosis not present

## 2023-08-05 DIAGNOSIS — R7303 Prediabetes: Secondary | ICD-10-CM | POA: Diagnosis not present

## 2023-08-05 DIAGNOSIS — I25118 Atherosclerotic heart disease of native coronary artery with other forms of angina pectoris: Secondary | ICD-10-CM | POA: Diagnosis not present

## 2023-08-08 ENCOUNTER — Telehealth: Payer: Self-pay

## 2023-08-08 NOTE — Telephone Encounter (Signed)
Dr. Allegra Lai is not going to be in the office or be able to do scopes on 09/23/2023. Called patient and reschedule her to 09/25/2023 with Dr. Allegra Lai. Mailed out new instructions. Called endo and talk Baxter Hire and she will get patient moved

## 2023-08-27 ENCOUNTER — Ambulatory Visit (INDEPENDENT_AMBULATORY_CARE_PROVIDER_SITE_OTHER): Payer: 59 | Admitting: Pulmonary Disease

## 2023-08-27 ENCOUNTER — Ambulatory Visit: Payer: 59 | Attending: Primary Care

## 2023-08-27 ENCOUNTER — Encounter: Payer: Self-pay | Admitting: Pulmonary Disease

## 2023-08-27 VITALS — BP 116/72 | HR 69 | Temp 97.6°F | Ht 65.0 in | Wt 217.6 lb

## 2023-08-27 DIAGNOSIS — J069 Acute upper respiratory infection, unspecified: Secondary | ICD-10-CM | POA: Diagnosis not present

## 2023-08-27 DIAGNOSIS — J4489 Other specified chronic obstructive pulmonary disease: Secondary | ICD-10-CM

## 2023-08-27 DIAGNOSIS — J449 Chronic obstructive pulmonary disease, unspecified: Secondary | ICD-10-CM | POA: Diagnosis not present

## 2023-08-27 DIAGNOSIS — J302 Other seasonal allergic rhinitis: Secondary | ICD-10-CM

## 2023-08-27 LAB — PULMONARY FUNCTION TEST ARMC ONLY
DL/VA % pred: 69 %
DL/VA: 2.83 ml/min/mmHg/L
DLCO unc % pred: 58 %
DLCO unc: 11.1 ml/min/mmHg
FEF 25-75 Post: 0.98 L/s
FEF 25-75 Pre: 0.81 L/s
FEF2575-%Change-Post: 21 %
FEF2575-%Pred-Post: 65 %
FEF2575-%Pred-Pre: 53 %
FEV1-%Change-Post: 5 %
FEV1-%Pred-Post: 76 %
FEV1-%Pred-Pre: 72 %
FEV1-Post: 1.53 L
FEV1-Pre: 1.45 L
FEV1FVC-%Change-Post: -2 %
FEV1FVC-%Pred-Pre: 87 %
FEV6-%Change-Post: 6 %
FEV6-%Pred-Post: 93 %
FEV6-%Pred-Pre: 87 %
FEV6-Post: 2.39 L
FEV6-Pre: 2.24 L
FEV6FVC-%Change-Post: -1 %
FEV6FVC-%Pred-Post: 104 %
FEV6FVC-%Pred-Pre: 105 %
FVC-%Change-Post: 7 %
FVC-%Pred-Post: 89 %
FVC-%Pred-Pre: 83 %
FVC-Post: 2.42 L
FVC-Pre: 2.24 L
Post FEV1/FVC ratio: 63 %
Post FEV6/FVC ratio: 99 %
Pre FEV1/FVC ratio: 65 %
Pre FEV6/FVC Ratio: 100 %
RV % pred: 102 %
RV: 2.41 L
TLC % pred: 91 %
TLC: 4.61 L

## 2023-08-27 MED ORDER — FLUTICASONE PROPIONATE 50 MCG/ACT NA SUSP: 2.0000 | Freq: Every day | NASAL | 6 refills | Status: DC

## 2023-08-27 MED ORDER — TRELEGY ELLIPTA 200-62.5-25 MCG/ACT IN AEPB: 1.0000 | INHALATION_SPRAY | Freq: Every day | RESPIRATORY_TRACT | 0 refills | Status: DC

## 2023-08-27 MED ORDER — TRELEGY ELLIPTA 200-62.5-25 MCG/ACT IN AEPB: 1.0000 | INHALATION_SPRAY | Freq: Every day | RESPIRATORY_TRACT | 11 refills | Status: DC

## 2023-08-27 MED ORDER — ALBUTEROL SULFATE (2.5 MG/3ML) 0.083% IN NEBU: 2.5000 mg | INHALATION_SOLUTION | Freq: Four times a day (QID) | RESPIRATORY_TRACT | 12 refills | Status: DC | PRN

## 2023-08-27 MED ORDER — ALBUTEROL SULFATE (2.5 MG/3ML) 0.083% IN NEBU
2.5000 mg | INHALATION_SOLUTION | Freq: Once | RESPIRATORY_TRACT | Status: AC
  Administered 2023-08-27: 2.5 mg via RESPIRATORY_TRACT
  Filled 2023-08-27: qty 3

## 2023-08-27 NOTE — Patient Instructions (Signed)
VISIT SUMMARY:  Miss Kunka visited today to discuss her respiratory health, including asthma, minimal COPD, and chronic nasal congestion. She has been using Advair and a nebulizer for her symptoms, and Flonase for nasal congestion. We reviewed her recent breathing test results and discussed adjustments to her treatment plan.  YOUR PLAN:  -ASTHMA WITH COPD: Asthma is a condition where your airways narrow and swell, producing extra mucus, while COPD is a group of lung diseases that block airflow and make it difficult to breathe. Your breathing test shows a slight decrease in lung capacity and asthma/COPD. We will switch your current medication from Advair to Trelegy Ellipta 200, which you will use once daily. This new medication includes an extra ingredient that may help with your symptoms. We also provided samples for you to try. Additionally, we will switch your nebulizer medication from Duoneb to albuterol for rescue use and refill your nebulizer solution.  -CHRONIC SINUS CONGESTION: Chronic sinus congestion is ongoing nasal stuffiness that can sometimes cause nosebleeds. You have been using Flonase, which has been effective. We will continue with this treatment to help manage your symptoms.  INSTRUCTIONS:  Please schedule a follow-up appointment in 3-4 months to review your progress and make any necessary adjustments to your treatment plan.

## 2023-08-27 NOTE — Progress Notes (Signed)
Subjective:    Patient ID: Kellie Phelps, female    DOB: [birth year], 77 y.o.   MRN: [id number]  Patient Care Team: Margarita Mail, DO as PCP - General (Internal Medicine) Antonieta Iba, MD as Consulting Physician (Cardiology) Wenda Overland, [place] as Social Worker  Chief Complaint  Patient presents with   Follow-up    No cough, shortness of breath or wheezing.     BACKGROUND:77 year old female, former smoker. Past medical history significant for hypertension, coronary artery disease, COPD/asthma, benign paroxysmal positional vertigo, seasonal allergies, atypical chest pain, diastolic dysfunction, hyperlipidemia.  Previous patient of Dr. Billy Fischer last seen by me 27 October 2020 and seen by Ames Dura, NP on 12 Feb 2023, here for follow-up on COPD/asthma.  HPI Discussed the use of AI scribe software for clinical note transcription with the patient, who gave verbal consent to proceed.  History of Present Illness   Kellie Phelps, a patient with a history of COPD and asthma, presents with concerns about her respiratory health. She reports that another physician informed her that she does not have COPD, but she continues to experience symptoms of asthma and bronchitis. She recently underwent a breathing test, the results of which she is unsure about.  She has been on Advair for her respiratory symptoms, which she reports has been working well for her. However, she did not take it the morning of the consultation due to instructions related to her breathing test. She also uses a nebulizer machine, for which she believes she needs a refill for the medication. She uses the nebulizer occasionally when she feels congested.  In addition to her respiratory symptoms, she reports chronic nasal congestion and frequent nosebleeds in the mornings. She has been using Flonase, which she reports has been helpful. She has been dealing with these symptoms for a while and has  previously seen another doctor (ENT) for them. She was given montelukast for her chronic sinus issues by this doctor.  She is currently not taking this.    Overall she feels well and looks well.  DATA: 08/21/2018 PFTs: FVC: 2.50 > 2.97 L (106 > 125 %pred), FEV1: 1.45 > 1.73 L (79 > 94 %pred), FEV1/FVC: 58%, TLC: 5.71 L (112 %pred), DLCO 73% pred.  Flow volume curve consistent with mild obstruction.  19% improvement in FEV1 after bronchodilator therapy. 08/27/2023 PFTs: FEV1 1.45 L or 72% predicted, FVC 2.24 L or 83% predicted, FEV1/FVC 65%, no significant bronchodilator response, lung volumes normal, moderate diffusion impairment.  Compared to prior study from 08/21/2018 there has been no significant change in FEV1.  Review of Systems A 10 point review of systems was performed and it is as noted above otherwise negative.   Past Medical History:  Diagnosis Date   Allergy    Anxiety    Asthma    Atypical chest pain    Cataract    COPD (chronic obstructive pulmonary disease) (HCC)    Coronary artery disease, non-occlusive    a. LHC 6/08 EF 55-60%, 20-30% proximal LAD, LIs CFX, LIs RCA (Harwani); b. ETT-myoview 11/10, 3' stopped due to fatigue and chest tightness, EF normal, probably normal perfusion images with minimal soft tissue attenuation   Diastolic dysfunction    a. echo 2010: EF 60-65%, GR1DD, no AI, trivial MR, LA nl   GERD (gastroesophageal reflux disease)    Glaucoma    Hyperglycemia    Hyperlipidemia    Hypertension     Past Surgical History:  Procedure Laterality Date  ESOPHAGOGASTRODUODENOSCOPY (EGD) WITH PROPOFOL N/A 05/25/2022   Procedure: ESOPHAGOGASTRODUODENOSCOPY (EGD) WITH PROPOFOL;  Surgeon: Toney Reil, MD;  Location: [facility] ENDOSCOPY;  Service: Gastroenterology;  Laterality: N/A;  RIDE IS ABOUT 20 MINUTES OUT   TONSILLECTOMY      Patient Active Problem List   Diagnosis Date Noted   Closed fracture of head of left radius 03/18/2023   BPPV (benign  paroxysmal positional vertigo) 01/17/2023   Weakness 01/15/2023   Mild intermittent asthma without complication 01/18/2022   Gastric erythema 01/18/2022   Seasonal allergies 01/18/2022   Anxiety 01/18/2022   Coronary artery disease, non-occlusive    Diastolic dysfunction    Atypical chest pain    Hyperglycemia    Chronic obstructive pulmonary disease (HCC) 04/02/2011   Hyperlipidemia 07/15/2009   Essential hypertension 07/15/2009   SHORTNESS OF BREATH 07/15/2009    Family History  Problem Relation Age of Onset   Uterine cancer Mother    Heart attack Father    Pulmonary embolism Brother    Hepatitis Brother    Coronary artery disease Brother    Coronary artery disease Other        Aunt    Social History   Tobacco Use   Smoking status: Former    Current packs/day: 0.00    Average packs/day: 0.5 packs/day for 5.0 years (2.5 ttl pk-yrs)    Types: Cigarettes    Start date: 09/17/1973    Quit date: 09/17/1978    Years since quitting: 44.9   Smokeless tobacco: Never   Tobacco comments:    unsure how long she smoked for--10/27/2020  Substance Use Topics   Alcohol use: No    Allergies  Allergen Reactions   Penicillins Hives and Other (See Comments)    Current Meds  Medication Sig   albuterol (PROAIR HFA) 108 (90 Base) MCG/ACT inhaler Inhale 2 puffs into the lungs every 6 (six) hours as needed. Wheezing or shortness of breath.   albuterol (PROVENTIL) (2.5 MG/3ML) 0.083% nebulizer solution Take 3 mLs (2.5 mg total) by nebulization every 6 (six) hours as needed for wheezing or shortness of breath.   amLODipine (NORVASC) 5 MG tablet Take 1 tablet (5 mg total) by mouth daily.   aspirin 81 MG tablet Take 81 mg by mouth daily.   Calcium Carbonate-Vitamin D 600-200 MG-UNIT TABS Take by mouth.   dorzolamide (TRUSOPT) 2 % ophthalmic solution Place 1 drop into both eyes 2 (two) times daily.   escitalopram (LEXAPRO) 10 MG tablet Take 1 tablet (10 mg total) by mouth daily.   [START ON  09/19/2023] Fluticasone-Umeclidin-Vilant (TRELEGY ELLIPTA) 200-62.5-25 MCG/ACT AEPB Inhale 1 puff into the lungs daily.   Fluticasone-Umeclidin-Vilant (TRELEGY ELLIPTA) 200-62.5-25 MCG/ACT AEPB Inhale 1 puff into the lungs daily.   metoprolol succinate (TOPROL-XL) 25 MG 24 hr tablet Take 1 tablet (25 mg total) by mouth daily.   Multiple Vitamins-Minerals (ONE-A-DAY WOMENS 50 PLUS PO) Take by mouth daily.   nitroGLYCERIN (NITROSTAT) 0.4 MG SL tablet Place 1 tablet (0.4 mg total) under the tongue every 5 (five) minutes as needed for chest pain.   Omega-3 Fatty Acids (FISH OIL) 1000 MG CAPS 1,000 mg. Take two tablets by mouth daily.   pantoprazole (PROTONIX) 40 MG tablet Take 40 mg by mouth daily.   simvastatin (ZOCOR) 40 MG tablet Take 1 tablet (40 mg total) by mouth daily.   triamcinolone (KENALOG) 0.025 % cream Apply 1 Application topically 2 (two) times daily.   VYZULTA 0.024 % SOLN Place 1 drop into both eyes  every evening.   [DISCONTINUED] fluticasone (FLONASE) 50 MCG/ACT nasal spray Place 2 sprays into both nostrils daily.   [DISCONTINUED] fluticasone-salmeterol (ADVAIR) 250-50 MCG/ACT AEPB Inhale 1 puff into the lungs in the morning and at bedtime.   [DISCONTINUED] ipratropium-albuterol (DUONEB) 0.5-2.5 (3) MG/3ML SOLN Take 3 mLs by nebulization every 6 (six) hours as needed.     There is no immunization history on file for this patient.      Objective:   BP 116/72 (BP Location: Left Arm, Patient Position: Sitting, Cuff Size: Normal)   Pulse 69   Temp 97.6 F (36.4 C) (Temporal)   Ht 5\' 5"  (1.651 m)   Wt 217 lb 9.6 oz (98.7 kg)   SpO2 96%   BMI 36.21 kg/m   SpO2: 96 %  GENERAL: Obese woman, no acute distress, fully ambulatory, no conversational dyspnea HEAD: Normocephalic, atraumatic.  EYES: Pupils equal, round, reactive to light.  No scleral icterus.  MOUTH: Pharynx clear, oral mucosa moist.  No thrush. NECK: Supple. No thyromegaly. Trachea midline. No JVD.  No  adenopathy. PULMONARY: Good air entry bilaterally.  No adventitious sounds. CARDIOVASCULAR: S1 and S2. Regular rate and rhythm.  No rubs, murmurs or gallops heard. ABDOMEN: Obese, benign. MUSCULOSKELETAL: No joint deformity, no clubbing, no edema.  NEUROLOGIC: No focal deficit, no gait disturbance, speech is fluent. SKIN: Intact,warm,dry. PSYCH: Mood and behavior normal  Pulmonary Function Test [facility] Only     Status: None   Collection Time: 08/27/23  1:28 PM  Result Value Ref Range   FVC-%Pred-Pre 83 %   FVC-Post 2.42 L   FVC-%Pred-Post 89 %   FVC-%Change-Post 7 %   FEV1-Pre 1.45 L   FEV1-%Pred-Pre 72 %   FEV1-Post 1.53 L   FEV1-%Pred-Post 76 %   FEV1-%Change-Post 5 %   FEV6-Pre 2.24 L   FEV6-%Pred-Pre 87 %   FEV6-Post 2.39 L   FEV6-%Pred-Post 93 %   FEV6-%Change-Post 6 %   Pre FEV1/FVC ratio 65 %   FEV1FVC-%Pred-Pre 87 %   Post FEV1/FVC ratio 63 %   FEV1FVC-%Change-Post -2 %   Pre FEV6/FVC Ratio 100 %   FEV6FVC-%Pred-Pre 105 %   Post FEV6/FVC ratio 99 %   FEV6FVC-%Pred-Post 104 %   FEV6FVC-%Change-Post -1 %   FEF 25-75 Pre 0.81 L/sec   FEF2575-%Pred-Pre 53 %   FEF 25-75 Post 0.98 L/sec   FEF2575-%Pred-Post 65 %   FEF2575-%Change-Post 21 %   RV 2.41 L   RV % pred 102 %   TLC 4.61 L   TLC % pred 91 %   DLCO unc 11.10 ml/min/mmHg   DLCO unc % pred 58 %   DL/VA 1.61 ml/min/mmHg/L   DL/VA % pred 69 %   FVC-Pre 2.24 L   Discussed PFTs with patient.   Assessment & Plan:     ICD-10-CM   1. 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 Seasonal allergies  J30.2 fluticasone (FLONASE) 50 MCG/ACT nasal spray    3. Viral upper respiratory tract infection  J06.9 fluticasone (FLONASE) 50 MCG/ACT nasal spray     Meds ordered this encounter  Medications   fluticasone (FLONASE) 50 MCG/ACT nasal spray    Sig: Place 2 sprays into both nostrils daily.    Dispense:  16 g    Refill:  6   Fluticasone-Umeclidin-Vilant (TRELEGY ELLIPTA) 200-62.5-25 MCG/ACT AEPB    Sig:  Inhale 1 puff into the lungs daily.    Dispense:  60 each    Refill:  11  Fluticasone-Umeclidin-Vilant (TRELEGY ELLIPTA) 200-62.5-25 MCG/ACT AEPB    Sig: Inhale 1 puff into the lungs daily.    Dispense:  24 each    Refill:  0    Lot Number?:   [id number]    Expiration Date?:   04/17/2025    Quantity:   2   albuterol (PROVENTIL) (2.5 MG/3ML) 0.083% nebulizer solution    Sig: Take 3 mLs (2.5 mg total) by nebulization every 6 (six) hours as needed for wheezing or shortness of breath.    Dispense:  75 mL    Refill:  12   Discussion:    Asthma with minimal COPD Asthma is the predominant issue with COPD component. PFTs show 76% capacity with no significant change compared to 2019. Diffusion capacity is slightly diminished, consistent with COPD. Current treatment with Advair is effective but may need adjustment due to slight worsening of COPD symptoms. Discussed trial of Trelegy Ellipta 200, which includes LAMA and is used once daily. Patient agreed to trial with samples provided. Explained that Trelegy Ellipta is similar in use to Advair and covered by insurance. Discussed switching Duoneb to albuterol for rescue nebulization to reduce muscarinic antagonist over dosing. - Discontinue Advair - Initiate trial of Trelegy Ellipta 200 - Provide samples of Trelegy Ellipta 200 - Switch Duoneb to albuterol for rescue nebulization   Chronic Sinus Congestion Persistent nasal congestion and epistaxis, possibly related to sinus issues. Previous treatment with Flonase was effective. Patient reports ongoing nasal stuffiness and occasional epistaxis. - Prescribe Flonase  General Health Maintenance Patient declines flu vaccination.  Follow-up - Follow-up appointment in 3-4 months.     Advised if symptoms do not improve or worsen, to please contact office for sooner follow up or seek emergency care.    I spent 35 minutes of dedicated to the care of this patient on the date of this encounter to include  pre-visit review of records, face-to-face time with the patient discussing conditions above, post visit ordering of testing, clinical documentation with the electronic health record, making appropriate referrals as documented, and communicating necessary findings to members of the patients care team.   C. Danice Goltz, MD Advanced Bronchoscopy PCCM [facility]    *This note was dictated using voice recognition software/Dragon.  Despite best efforts to proofread, errors can occur which can change the meaning. Any transcriptional errors that result from this process are unintentional and may not be fully corrected at the time of dictation.

## 2023-09-04 DIAGNOSIS — S52125A Nondisplaced fracture of head of left radius, initial encounter for closed fracture: Secondary | ICD-10-CM | POA: Diagnosis not present

## 2023-09-05 DIAGNOSIS — H40153 Residual stage of open-angle glaucoma, bilateral: Secondary | ICD-10-CM | POA: Diagnosis not present

## 2023-09-20 ENCOUNTER — Other Ambulatory Visit: Payer: Self-pay | Admitting: Internal Medicine

## 2023-09-20 DIAGNOSIS — Z1231 Encounter for screening mammogram for malignant neoplasm of breast: Secondary | ICD-10-CM

## 2023-09-24 ENCOUNTER — Telehealth: Payer: Self-pay | Admitting: Pulmonary Disease

## 2023-09-24 ENCOUNTER — Telehealth: Payer: Self-pay

## 2023-09-24 MED ORDER — ALBUTEROL SULFATE (2.5 MG/3ML) 0.083% IN NEBU: 2.5000 mg | INHALATION_SOLUTION | Freq: Four times a day (QID) | RESPIRATORY_TRACT | 12 refills | Status: DC | PRN

## 2023-09-24 NOTE — Telephone Encounter (Signed)
 This is second time she has canceled the procedure. Canceled the procedure for 07/17/2023 and now for 09/25/2023

## 2023-09-24 NOTE — Telephone Encounter (Signed)
 Per Kellie Phelps has called and cancelled for 09-24-22 with Dr Allegra Lai due to being out of town. She said she will call the office to reschedule.

## 2023-09-24 NOTE — Telephone Encounter (Signed)
 Prescription sent to [facility] as patient requested. Nothing further needed.

## 2023-09-24 NOTE — Telephone Encounter (Signed)
 Please resend neb solution RX as PT was out of town and could not pick it up.Albuterol   [facility] on [address] [employer]

## 2023-09-25 ENCOUNTER — Ambulatory Visit: Admission: RE | Admit: 2023-09-25 | Payer: 59 | Source: Home / Self Care | Admitting: Gastroenterology

## 2023-09-25 SURGERY — COLONOSCOPY WITH PROPOFOL
Anesthesia: General

## 2023-10-03 ENCOUNTER — Ambulatory Visit
Admission: RE | Admit: 2023-10-03 | Discharge: 2023-10-03 | Disposition: A | Discharge status: Home / Self Care | Payer: 59 | Source: Ambulatory Visit | Attending: Internal Medicine | Admitting: Internal Medicine

## 2023-10-03 DIAGNOSIS — Z1231 Encounter for screening mammogram for malignant neoplasm of breast: Secondary | ICD-10-CM | POA: Insufficient documentation

## 2023-10-04 ENCOUNTER — Encounter: Payer: Self-pay | Admitting: Internal Medicine

## 2023-10-07 DIAGNOSIS — G8929 Other chronic pain: Secondary | ICD-10-CM | POA: Diagnosis not present

## 2023-10-07 DIAGNOSIS — M51372 Other intervertebral disc degeneration, lumbosacral region with discogenic back pain and lower extremity pain: Secondary | ICD-10-CM | POA: Diagnosis not present

## 2023-10-07 DIAGNOSIS — M1712 Unilateral primary osteoarthritis, left knee: Secondary | ICD-10-CM | POA: Diagnosis not present

## 2023-10-07 DIAGNOSIS — M5442 Lumbago with sciatica, left side: Secondary | ICD-10-CM | POA: Diagnosis not present

## 2023-10-07 DIAGNOSIS — M47816 Spondylosis without myelopathy or radiculopathy, lumbar region: Secondary | ICD-10-CM | POA: Diagnosis not present

## 2023-10-08 DIAGNOSIS — H02885 Meibomian gland dysfunction left lower eyelid: Secondary | ICD-10-CM | POA: Diagnosis not present

## 2023-10-08 DIAGNOSIS — H401134 Primary open-angle glaucoma, bilateral, indeterminate stage: Secondary | ICD-10-CM | POA: Diagnosis not present

## 2023-10-08 DIAGNOSIS — H02882 Meibomian gland dysfunction right lower eyelid: Secondary | ICD-10-CM | POA: Diagnosis not present

## 2023-10-08 DIAGNOSIS — H00014 Hordeolum externum left upper eyelid: Secondary | ICD-10-CM | POA: Diagnosis not present

## 2023-10-08 DIAGNOSIS — H26493 Other secondary cataract, bilateral: Secondary | ICD-10-CM | POA: Diagnosis not present

## 2023-10-15 ENCOUNTER — Encounter: Payer: Self-pay | Admitting: Internal Medicine

## 2023-10-15 ENCOUNTER — Other Ambulatory Visit: Payer: Self-pay

## 2023-10-15 ENCOUNTER — Ambulatory Visit (INDEPENDENT_AMBULATORY_CARE_PROVIDER_SITE_OTHER): Payer: 59 | Admitting: Internal Medicine

## 2023-10-15 VITALS — BP 130/80 | Temp 97.9°F | Resp 16 | Ht 65.0 in | Wt 220.8 lb

## 2023-10-15 DIAGNOSIS — N644 Mastodynia: Secondary | ICD-10-CM | POA: Diagnosis not present

## 2023-10-15 DIAGNOSIS — B009 Herpesviral infection, unspecified: Secondary | ICD-10-CM

## 2023-10-15 MED ORDER — VALACYCLOVIR HCL 1 G PO TABS: 1000.0000 mg | ORAL_TABLET | Freq: Two times a day (BID) | ORAL | 0 refills | Status: AC

## 2023-10-15 NOTE — Progress Notes (Signed)
   Acute Office Visit  Subjective:     Patient ID: Kellie Phelps, female    DOB: [birth year], 78 y.o.   MRN: [id number]  Chief Complaint  Patient presents with   Breast Pain    Left breast pain, mammogram normal   Blister    On mouth    HPI Patient is in today for left breast pain. Pain started after she woke up, thinks she might have slept unusually. Pain right over pectoralis minor insertion point. No skin changes. Just had mammogram earlier this month which was normal. No trauma, lumps/masses or nipple drainage/changes.   She also has a cold sore on the bottom right lip that started a few days ago. Painful and burning. Does not have recurrent flares. Does not take anything for it.    Review of Systems  Constitutional:  Negative for chills and fever.  Respiratory:  Negative for shortness of breath.   Cardiovascular:  Negative for chest pain.  Skin:  Positive for rash.        Objective:    BP 130/80 (Cuff Size: Large)   Temp 97.9 F (36.6 C) (Oral)   Resp 16   Ht 5\' 5"  (1.651 m)   Wt 220 lb 12.8 oz (100.2 kg)   SpO2 94%   BMI 36.74 kg/m    Physical Exam Constitutional:      Appearance: Normal appearance.  HENT:     Head: Normocephalic and atraumatic.  Eyes:     Conjunctiva/sclera: Conjunctivae normal.  Cardiovascular:     Rate and Rhythm: Normal rate and regular rhythm.  Pulmonary:     Effort: Pulmonary effort is normal.     Breath sounds: Normal breath sounds.  Chest:     Chest wall: Tenderness present. No swelling.  Breasts:    Right: Normal.     Left: Normal.     Comments: Tenderness over pectoral minor insertion point Lymphadenopathy:     Upper Body:     Right upper body: No supraclavicular or axillary adenopathy.     Left upper body: No supraclavicular or axillary adenopathy.  Skin:    General: Skin is warm and dry.     Comments: Hsv infection on bottom right lip  Neurological:     General: No focal deficit present.     Mental  Status: She is alert. Mental status is at baseline.  Psychiatric:        Mood and Affect: Mood normal.        Behavior: Behavior normal.     No results found for any visits on 10/15/23.      Assessment & Plan:   1. Breast pain, left (Primary): No abnormalities on breast exam, pain is over pectoral minor insertion point with minor swelling and pain. I do not think this is a lymph node, should resolve on its own, can take Ibuprofen as needed or use topical anti-inflammatories and continue to monitor, if it enlarges or fails to resolve by follow up will order an [place] to assess further.   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   - valACYclovir (VALTREX) 1000 MG tablet; Take 1 tablet (1,000 mg total) by mouth 2 (two) times daily for 10 days.  Dispense: 20 tablet; Refill: 0  Return for already scheduled.  Margarita Mail, DO

## 2023-11-04 ENCOUNTER — Other Ambulatory Visit: Payer: Self-pay | Admitting: Internal Medicine

## 2023-11-04 DIAGNOSIS — J441 Chronic obstructive pulmonary disease with (acute) exacerbation: Secondary | ICD-10-CM

## 2023-11-05 NOTE — Telephone Encounter (Signed)
Requested Prescriptions  Pending Prescriptions Disp Refills   albuterol (VENTOLIN HFA) 108 (90 Base) MCG/ACT inhaler [Pharmacy Med Name: albuterol sulfate HFA 90 mcg/actuation aerosol inhaler] 18 g 3    Sig: INHALE 2 PUFFS INTO THE LUNGS EVERY 6  HOURS AS NEEDED. WHEEZING OR SHORTNESS OF BREATH.     Pulmonology:  Beta Agonists 2 Passed - 11/05/2023  9:19 AM      Passed - Last BP in normal range    BP Readings from Last 1 Encounters:  10/15/23 130/80         Passed - Last Heart Rate in normal range    Pulse Readings from Last 1 Encounters:  08/27/23 69         Passed - Valid encounter within last 12 months    Recent Outpatient Visits           3 weeks ago Breast pain, left   [facility] Margarita Mail, DO   3 months ago Prediabetes   [facility] Margarita Mail, DO   6 months ago Viral upper respiratory tract infection   [facility] Margarita Mail, DO   9 months ago Hospital discharge follow-up   [facility] Margarita Mail, DO   9 months ago COPD exacerbation [facility])   [facility] Margarita Mail, DO       Future Appointments             In 3 weeks Salena Saner, MD [facility] [place]   In 2 months Margarita Mail, DO [facility]

## 2023-11-11 ENCOUNTER — Ambulatory Visit: Payer: 59 | Admitting: Internal Medicine

## 2023-11-15 ENCOUNTER — Ambulatory Visit: Payer: Self-pay | Admitting: Internal Medicine

## 2023-11-15 NOTE — Telephone Encounter (Signed)
 Pt needs appt.

## 2023-11-15 NOTE — Telephone Encounter (Signed)
  Chief Complaint: bug bite Symptoms: itches and wont go away Frequency: comes and goes   Disposition: [] ED /[] Urgent Care (no appt availability in office) / [] Appointment(In office/virtual)/ []  [place] [facility]/ [x] Home Care/ [] Refused Recommended Disposition /[] [facility]/ []  Follow-up with PCP Additional Notes: Pt calling with concerns of bug bite on forehead that happened over a week ago. Pt has been putting calamine lotion and neosporin. pt denies any signs of infection. Pt states, "it itches like it is healing but just wont go away." Pt is declining appt due to transportation logistics. Pt states it takes 3 days to organize travel. Pt is asking if medication could be called in. RN offered advice on OTC to help with itching.  Pt also mentioned she has mucus in throat and at times when blowing nose, blood is present. Pt wants you to be aware. RN gave care advice and pt verbalized understanding.             Copied from CRM [id number]. Topic: Clinical - Medical Advice >> Nov 15, 2023 11:13 AM Shon Hale wrote: Reason for CRM: Something bit her last Saturday on her forehead, no pain. Patient put calamine lotion and neosporin. Patient unsure what bit her but states it is red still and little bumps. Patient declined appointment. Patient also states her allergies are still bothering her as well.   Patient requesting prescription to help Reason for Disposition  Itchy insect bite  Answer Assessment - Initial Assessment Questions 1. TYPE of INSECT: "What type of insect was it?"      Not sure  2. ONSET: "When did you get bitten?"      Last Saturday  3. LOCATION: "Where is the insect bite located?"      Forehead 4. REDNESS: "Is the area red or pink?" If Yes, ask: "What size is area of redness?" (inches or cm). "When did the redness start?"     No longer red  5. PAIN: "Is there any pain?" If Yes, ask: "How bad is it?"  (Scale 1-10; or mild, moderate, severe)      no 6.  ITCHING: "Does it itch?" If Yes, ask: "How bad is the itch?"    - MILD: doesn't interfere with normal activities   - MODERATE-SEVERE: interferes with work, school, sleep, or other activities      Yes-mild  7. SWELLING: "How big is the swelling?" (inches, cm, or compare to coins)     Na  8. OTHER SYMPTOMS: "Do you have any other symptoms?"  (e.g., difficulty breathing, hives)     Denies  Protocols used: Insect Bite-A-AH

## 2023-11-15 NOTE — Telephone Encounter (Signed)
 Lvm asking pt to return call to schedule appt

## 2023-11-27 ENCOUNTER — Ambulatory Visit: Admitting: Internal Medicine

## 2023-11-28 ENCOUNTER — Ambulatory Visit: Payer: 59 | Admitting: Pulmonary Disease

## 2023-12-04 NOTE — Congregational Nurse Program (Signed)
  Dept: [phone]   Congregational Nurse Program Note  Date of Encounter: 11/20/2023  Clinic visit to discuss food pantry offered by [employer] and to get devotional booklet.  Food pantry operated at another location not at [employer], however the [employer] coordinator will bring some items to this location. Given the spring devotional booklet.  Past Medical History: Past Medical History:  Diagnosis Date   Allergy    Anxiety    Asthma    Atypical chest pain    Cataract    COPD (chronic obstructive pulmonary disease) (HCC)    Coronary artery disease, non-occlusive    a. LHC 6/08 EF 55-60%, 20-30% proximal LAD, LIs CFX, LIs RCA (Harwani); b. ETT-myoview 11/10, 3' stopped due to fatigue and chest tightness, EF normal, probably normal perfusion images with minimal soft tissue attenuation   Diastolic dysfunction    a. echo 2010: EF 60-65%, GR1DD, no AI, trivial MR, LA nl   GERD (gastroesophageal reflux disease)    Glaucoma    Hyperglycemia    Hyperlipidemia    Hypertension     Encounter Details:  Community Questionnaire - 11/20/23 1100       Questionnaire   Ask client: Do you give verbal consent for me to treat you today? Yes    Student Assistance N/A    Location Patient Served  [facility]    Encounter Setting CN site    Population Status Unknown   Has own apartment at [employer];Medicare    Insurance/Financial Assistance Referral N/A    Medication N/A    Medical Provider Yes    Screening Referrals Made N/A    Medical Referrals Made N/A    Medical Appointment Completed N/A    CNP Interventions Counsel;Advocate/Support;Educate;Spiritual Care    Screenings CN Performed N/A    ED Visit Averted N/A    Life-Saving Intervention Made N/A

## 2023-12-10 ENCOUNTER — Other Ambulatory Visit: Payer: Self-pay | Admitting: Internal Medicine

## 2023-12-10 DIAGNOSIS — I10 Essential (primary) hypertension: Secondary | ICD-10-CM

## 2023-12-11 NOTE — Telephone Encounter (Signed)
 Requested Prescriptions  Pending Prescriptions Disp Refills   amLODipine (NORVASC) 5 MG tablet [Pharmacy Med Name: amlodipine 5 mg tablet] 90 tablet 0    Sig: TAKE 1 TABLET (5 MG TOTAL) BY MOUTH DAILY.     Cardiovascular: Calcium Channel Blockers 2 Passed - 12/11/2023 12:55 PM      Passed - Last BP in normal range    BP Readings from Last 1 Encounters:  10/15/23 130/80         Passed - Last Heart Rate in normal range    Pulse Readings from Last 1 Encounters:  08/27/23 69         Passed - Valid encounter within last 6 months    Recent Outpatient Visits           1 month ago Breast pain, left   [facility] Margarita Mail, DO   4 months ago Prediabetes   [facility] Margarita Mail, DO   7 months ago Viral upper respiratory tract infection   [facility] Margarita Mail, DO   10 months ago Hospital discharge follow-up   [facility] Margarita Mail, DO   11 months ago COPD exacerbation [facility])   [facility] Margarita Mail, DO       Future Appointments             In 1 month Margarita Mail, DO [facility]

## 2023-12-16 ENCOUNTER — Ambulatory Visit (INDEPENDENT_AMBULATORY_CARE_PROVIDER_SITE_OTHER): Admitting: Internal Medicine

## 2023-12-16 ENCOUNTER — Other Ambulatory Visit: Payer: Self-pay

## 2023-12-16 ENCOUNTER — Encounter: Payer: Self-pay | Admitting: Internal Medicine

## 2023-12-16 VITALS — BP 130/72 | HR 73 | Temp 98.0°F | Resp 16 | Ht 65.0 in | Wt 224.0 lb

## 2023-12-16 DIAGNOSIS — I10 Essential (primary) hypertension: Secondary | ICD-10-CM | POA: Diagnosis not present

## 2023-12-16 DIAGNOSIS — K219 Gastro-esophageal reflux disease without esophagitis: Secondary | ICD-10-CM

## 2023-12-16 DIAGNOSIS — J302 Other seasonal allergic rhinitis: Secondary | ICD-10-CM

## 2023-12-16 DIAGNOSIS — E782 Mixed hyperlipidemia: Secondary | ICD-10-CM

## 2023-12-16 DIAGNOSIS — J4489 Other specified chronic obstructive pulmonary disease: Secondary | ICD-10-CM

## 2023-12-16 DIAGNOSIS — F419 Anxiety disorder, unspecified: Secondary | ICD-10-CM

## 2023-12-16 DIAGNOSIS — T148XXA Other injury of unspecified body region, initial encounter: Secondary | ICD-10-CM | POA: Insufficient documentation

## 2023-12-16 DIAGNOSIS — I2510 Atherosclerotic heart disease of native coronary artery without angina pectoris: Secondary | ICD-10-CM

## 2023-12-16 DIAGNOSIS — R7303 Prediabetes: Secondary | ICD-10-CM | POA: Insufficient documentation

## 2023-12-16 MED ORDER — SIMVASTATIN 40 MG PO TABS: 40.0000 mg | ORAL_TABLET | Freq: Every day | ORAL | 1 refills | Status: DC

## 2023-12-16 MED ORDER — METOPROLOL SUCCINATE ER 25 MG PO TB24: 25.0000 mg | ORAL_TABLET | Freq: Every day | ORAL | 3 refills | Status: AC

## 2023-12-16 MED ORDER — ESCITALOPRAM OXALATE 10 MG PO TABS: 10.0000 mg | ORAL_TABLET | Freq: Every day | ORAL | 1 refills | Status: DC

## 2023-12-16 MED ORDER — CETIRIZINE HCL 10 MG PO TABS
10.0000 mg | ORAL_TABLET | Freq: Every day | ORAL | 11 refills | Status: DC
Start: 2023-12-16 — End: 2024-08-07

## 2023-12-16 MED ORDER — PANTOPRAZOLE SODIUM 40 MG PO TBEC: 40.0000 mg | DELAYED_RELEASE_TABLET | Freq: Every day | ORAL | 1 refills | Status: DC

## 2023-12-16 NOTE — Assessment & Plan Note (Signed)
 Stable, refill PPI.

## 2023-12-16 NOTE — Patient Instructions (Addendum)
 It was great seeing you today!  Plan discussed at today's visit: -Blood work ordered today, results will be uploaded to MyChart.  -Recommend Zyrtec daily at bedtime, as well as allergy nasal spray Astelin or Astrepro and allergy eye drop Zadiator  Follow up in: 6 months   Take care and let us know if you have any questions or concerns prior to your next visit.  Dr. Caralee Ates

## 2023-12-16 NOTE — Assessment & Plan Note (Signed)
 Stable, following with Cardiology, recheck fasting lipids today. Continue statin.

## 2023-12-16 NOTE — Assessment & Plan Note (Signed)
 Will restart Zyrtec, also recommend Astelin nasal spray and Zadiator eye drops as needed.

## 2023-12-16 NOTE — Assessment & Plan Note (Signed)
 Stable, continue Lexapro and refill.

## 2023-12-16 NOTE — Progress Notes (Signed)
 Acute Office Visit  Subjective:     Patient ID: Kellie Phelps, female    DOB: 09-Aug-1946, 78 y.o.   MRN: [id number]  Chief Complaint  Patient presents with   Allergic Rhinitis    Bleeding/Bruising    Bruise up left leg    HPI Patient is in today for allergies and bruising on her legs. Patient usually has bad spring allergies, having watery, itchy eyes and clear rhinorrhea. Not currently taking any medication. Patient also noticed 4 large bruises on her left inner thigh, first noticed them a few days ago. They are healing but she is uncertain how she got them. Does not have bruises anywhere else.   Hypertension/Diastolic HF: -Medications: Amlodipine 5 mg, Metoprolol 25 mg, Nitroglycerin PRN -Patient is compliant with above medications and reports no side effects. -Checking BP at home (average): 120-130/60-70, stable  -Denies any SOB, CP, vision changes, LE edema or symptoms of hypotension -Follows with cardiology in [place], had a stress test on 10/13/21, EF 62% with small reversible defect of the anteroseptal wall in the apical and mid segments but otherwise normal.   HLD/CAD: -Medications: Zocor 40 mg, aspirin 81 mg, fish oil  -Patient is compliant with above medications and reports no side effects. -Lipid Panel     Component Value Date/Time   CHOL 146 01/16/2023 0441   CHOL 155 03/31/2015 1439   TRIG 102 01/16/2023 0441   HDL 47 01/16/2023 0441   HDL 45 03/31/2015 1439   CHOLHDL 3.1 01/16/2023 0441   VLDL 20 01/16/2023 0441   LDLCALC 79 01/16/2023 0441   LDLCALC 75 01/18/2022 1350   LABVLDL 43 (H) 03/31/2015 1439   COPD/Asthma: -Following with [facility] -COPD status: controlled  -Current medications: Trelegy, Albuterol PRN. -Satisfied with current treatment?: yes -Oxygen use: no -Dyspnea frequency: with exertion occasionally -Cough frequency: non-productive currently -Rescue inhaler frequency:  about twice a day  -Wheezing: No -Limitation  of activity: no -Productive cough: no -Last Spirometry/PFTs: 2019 -Pneumovax: Not up to Date, continues to politely decline -Influenza: Not up to Date   GERD: -Currently on Pantoprazole 40 mg -Had EGD on 05/25/2022 which showed diffuse mildly erythematous mucosa without bleeding in the gastric fundus and gastric body.  Biopsy showed esophageal candidiasis and she was treated with oral fluconazole.  Anxiety: -Duration:stable -Currently on Lexapro 10 mg daily  -She is compliant with medications and denies side effects. -Had been on Diazepam in the past, discussed how this medication would not be prescribed here. She was given Hydroxyzine 10 mg at her LOV for as needed anxiety, she took it once and felt very sleepy.   Pre-Diabetes: -A1c 10/24 5.7% -Not currently on medication    Health Maintenance: -Blood work due -Mammogram 1/25, Birads-1 -Declines all vaccines   Review of Systems  Eyes:  Positive for discharge.  All other systems reviewed and are negative.       Objective:    BP 130/72 (Cuff Size: Large)   Pulse 73   Temp 98 F (36.7 C) (Oral)   Resp 16   Ht 5\' 5"  (1.651 m)   Wt 224 lb (101.6 kg)   SpO2 98%   BMI 37.28 kg/m  BP Readings from Last 3 Encounters:  12/16/23 130/72  10/15/23 130/80  08/27/23 116/72   Wt Readings from Last 3 Encounters:  12/16/23 224 lb (101.6 kg)  10/15/23 220 lb 12.8 oz (100.2 kg)  08/27/23 217 lb 9.6 oz (98.7 kg)      Physical Exam Constitutional:  Appearance: Normal appearance.  HENT:     Head: Normocephalic and atraumatic.     Mouth/Throat:     Mouth: Mucous membranes are moist.     Pharynx: Oropharynx is clear.  Eyes:     Extraocular Movements: Extraocular movements intact.     Conjunctiva/sclera: Conjunctivae normal.     Pupils: Pupils are equal, round, and reactive to light.  Cardiovascular:     Rate and Rhythm: Normal rate and regular rhythm.  Pulmonary:     Effort: Pulmonary effort is normal.     Breath  sounds: Normal breath sounds.  Skin:    General: Skin is warm and dry.  Neurological:     General: No focal deficit present.     Mental Status: She is alert. Mental status is at baseline.  Psychiatric:        Mood and Affect: Mood normal.        Behavior: Behavior normal.     No results found for any visits on 12/16/23.      Assessment & Plan:   Seasonal allergies Assessment & Plan: Will restart Zyrtec, also recommend Astelin nasal spray and Zadiator eye drops as needed.   Orders: -     Cetirizine HCl; Take 1 tablet (10 mg total) by mouth daily.  Dispense: 30 tablet; Refill: 11  Bruising Assessment & Plan: Bruising in one area, healing appropriately. I am not concerned, will order INR with other labs.   Orders: -     Protime-INR  Essential hypertension Assessment & Plan: Blood pressure stable here today, no changes made to medications and appropriate refills sent to pharmacy. Labs due.   Orders: -     COMPLETE METABOLIC PANEL WITHOUT GFR -     CBC with Differential/Platelet -     Metoprolol Succinate ER; Take 1 tablet (25 mg total) by mouth daily.  Dispense: 90 tablet; Refill: 3  Mixed hyperlipidemia Assessment & Plan: Recheck lipid panel, refill statin.   Orders: -     Lipid panel -     Simvastatin; Take 1 tablet (40 mg total) by mouth daily.  Dispense: 90 tablet; Refill: 1  Prediabetes Assessment & Plan: Recheck A1c.   Orders: -     Hemoglobin A1c  Anxiety Assessment & Plan: Stable, continue Lexapro and refill.   Orders: -     Escitalopram Oxalate; Take 1 tablet (10 mg total) by mouth daily.  Dispense: 90 tablet; Refill: 1  Coronary artery disease, non-occlusive Assessment & Plan: Stable, following with Cardiology, recheck fasting lipids today. Continue statin.   Orders: -     Simvastatin; Take 1 tablet (40 mg total) by mouth daily.  Dispense: 90 tablet; Refill: 1  Gastroesophageal reflux disease, unspecified whether esophagitis  present Assessment & Plan: Stable, refill PPI.  Orders: -     Pantoprazole Sodium; Take 1 tablet (40 mg total) by mouth daily.  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       Return in about 6 months (around 06/16/2024) for can cancel April appointment and move to 6 months.  Margarita Mail, DO

## 2023-12-16 NOTE — Assessment & Plan Note (Signed)
 Blood pressure stable here today, no changes made to medications and appropriate refills sent to pharmacy. Labs due.

## 2023-12-16 NOTE — Assessment & Plan Note (Signed)
 Following with Pulmonology but doing well on the Trelegy.

## 2023-12-16 NOTE — Assessment & Plan Note (Signed)
 Bruising in one area, healing appropriately. I am not concerned, will order INR with other labs.

## 2023-12-16 NOTE — Assessment & Plan Note (Signed)
 Recheck lipid panel, refill statin.

## 2023-12-16 NOTE — Assessment & Plan Note (Signed)
 Recheck A1c

## 2023-12-17 LAB — CBC WITH DIFFERENTIAL/PLATELET
Absolute Lymphocytes: 2376 {cells}/uL (ref 850–3900)
Absolute Monocytes: 522 {cells}/uL (ref 200–950)
Basophils Absolute: 72 {cells}/uL (ref 0–200)
Basophils Relative: 1.2 %
Eosinophils Absolute: 132 {cells}/uL (ref 15–500)
Eosinophils Relative: 2.2 %
HCT: 43.5 % (ref 35.0–45.0)
Hemoglobin: 14.2 g/dL (ref 11.7–15.5)
MCH: 29.3 pg (ref 27.0–33.0)
MCHC: 32.6 g/dL (ref 32.0–36.0)
MCV: 89.7 fL (ref 80.0–100.0)
MPV: 9.8 fL (ref 7.5–12.5)
Monocytes Relative: 8.7 %
Neutro Abs: 2898 {cells}/uL (ref 1500–7800)
Neutrophils Relative %: 48.3 %
Platelets: 225 10*3/uL (ref 140–400)
RBC: 4.85 10*6/uL (ref 3.80–5.10)
RDW: 12.7 % (ref 11.0–15.0)
Total Lymphocyte: 39.6 %
WBC: 6 10*3/uL (ref 3.8–10.8)

## 2023-12-17 LAB — LIPID PANEL
Cholesterol: 139 mg/dL (ref <200)
HDL: 51 mg/dL (ref >=50)
LDL Cholesterol (Calc): 70 mg/dL
Non-HDL Cholesterol (Calc): 88 mg/dL (ref <130)
Total CHOL/HDL Ratio: 2.7 (calc) (ref <5.0)
Triglycerides: 98 mg/dL (ref <150)

## 2023-12-17 LAB — COMPLETE METABOLIC PANEL WITHOUT GFR
AG Ratio: 2 (calc) (ref 1.0–2.5)
ALT: 46 U/L — ABNORMAL HIGH (ref 6–29)
AST: 40 U/L — ABNORMAL HIGH (ref 10–35)
Albumin: 4.4 g/dL (ref 3.6–5.1)
Alkaline phosphatase (APISO): 69 U/L (ref 37–153)
BUN/Creatinine Ratio: 14 (calc) (ref 6–22)
BUN: 14 mg/dL (ref 7–25)
CO2: 29 mmol/L (ref 20–32)
Calcium: 9.4 mg/dL (ref 8.6–10.4)
Chloride: 105 mmol/L (ref 98–110)
Creat: 1.01 mg/dL — ABNORMAL HIGH (ref 0.60–1.00)
Globulin: 2.2 g/dL (ref 1.9–3.7)
Glucose, Bld: 96 mg/dL (ref 65–99)
Potassium: 4.6 mmol/L (ref 3.5–5.3)
Sodium: 141 mmol/L (ref 135–146)
Total Bilirubin: 0.4 mg/dL (ref 0.2–1.2)
Total Protein: 6.6 g/dL (ref 6.1–8.1)

## 2023-12-17 LAB — PROTIME-INR
INR: 1
Prothrombin Time: 10.9 s (ref 9.0–11.5)

## 2023-12-17 LAB — HEMOGLOBIN A1C
Hgb A1c MFr Bld: 6 %{Hb} — ABNORMAL HIGH (ref <5.7)
Mean Plasma Glucose: 126 mg/dL
eAG (mmol/L): 7 mmol/L

## 2023-12-19 ENCOUNTER — Encounter: Payer: Self-pay | Admitting: Internal Medicine

## 2024-01-13 ENCOUNTER — Ambulatory Visit: Payer: 59 | Admitting: Internal Medicine

## 2024-03-09 ENCOUNTER — Other Ambulatory Visit: Payer: Self-pay | Admitting: Internal Medicine

## 2024-03-09 DIAGNOSIS — I10 Essential (primary) hypertension: Secondary | ICD-10-CM

## 2024-03-09 DIAGNOSIS — J441 Chronic obstructive pulmonary disease with (acute) exacerbation: Secondary | ICD-10-CM

## 2024-03-09 NOTE — Telephone Encounter (Unsigned)
 Copied from CRM [id number]. Topic: Clinical - Medication Refill >> Mar 09, 2024 12:17 PM Fonda T wrote: Medication:  albuterol  (VENTOLIN  HFA) 108 (90 Base) MCG/ACT inhaler  amLODipine  (NORVASC ) 5 MG tablet   Has the patient contacted their pharmacy? Yes, per patient pharmacy states to contact office for refill request (Agent: If no, request that the patient contact the pharmacy for the refill. If patient does not wish to contact the pharmacy document the reason why and proceed with request.) (Agent: If yes, when and what did the pharmacy advise?)  This is the patient's preferred pharmacy:  [facility] - [place], [place] - [address] [place]  [address] Phone: [phone] Fax: [phone]   Is this the correct pharmacy for this prescription? Yes If no, delete pharmacy and type the correct one.   Has the prescription been filled recently? Yes  Is the patient out of the medication? No  Has the patient been seen for an appointment in the last year OR does the patient have an upcoming appointment? Yes  Can we respond through MyChart? Yes  Agent: Please be advised that Rx refills may take up to 3 business days. We ask that you follow-up with your pharmacy.

## 2024-03-10 NOTE — Telephone Encounter (Signed)
 Requested Prescriptions  Pending Prescriptions Disp Refills   albuterol  (VENTOLIN  HFA) 108 (90 Base) MCG/ACT inhaler [Pharmacy Med Name: albuterol  sulfate HFA 90 mcg/actuation aerosol inhaler] 18 g 0    Sig: INHALE 2 PUFFS INTO THE LUNGS EVERY 6  HOURS AS NEEDED FOR WHEEZING OR SHORTNESS OF BREATH.     Pulmonology:  Beta Agonists 2 Failed - 03/10/2024  3:36 PM      Failed - Valid encounter within last 12 months    Recent Outpatient Visits           2 months ago Seasonal allergies   [facility] Bernardo Fend, DO              Passed - Last BP in normal range    BP Readings from Last 1 Encounters:  12/16/23 130/72         Passed - Last Heart Rate in normal range    Pulse Readings from Last 1 Encounters:  12/16/23 73          amLODipine  (NORVASC ) 5 MG tablet [Pharmacy Med Name: amlodipine  5 mg tablet] 90 tablet 0    Sig: TAKE 1 TABLET BY MOUTH DAILY.     Cardiovascular: Calcium  Channel Blockers 2 Failed - 03/10/2024  3:36 PM      Failed - Valid encounter within last 6 months    Recent Outpatient Visits           2 months ago Seasonal allergies   [facility] Bernardo Fend, DO              Passed - Last BP in normal range    BP Readings from Last 1 Encounters:  12/16/23 130/72         Passed - Last Heart Rate in normal range    Pulse Readings from Last 1 Encounters:  12/16/23 73

## 2024-03-11 NOTE — Telephone Encounter (Signed)
 Duplicate request, refilled 03/10/24.  Requested Prescriptions  Pending Prescriptions Disp Refills   albuterol  (VENTOLIN  HFA) 108 (90 Base) MCG/ACT inhaler 18 g 3     Pulmonology:  Beta Agonists 2 Failed - 03/11/2024  9:11 AM      Failed - Valid encounter within last 12 months    Recent Outpatient Visits           2 months ago Seasonal allergies   [facility] Bernardo Fend, DO              Passed - Last BP in normal range    BP Readings from Last 1 Encounters:  12/16/23 130/72         Passed - Last Heart Rate in normal range    Pulse Readings from Last 1 Encounters:  12/16/23 73          amLODipine  (NORVASC ) 5 MG tablet 90 tablet 0    Sig: Take 1 tablet (5 mg total) by mouth daily.     Cardiovascular: Calcium  Channel Blockers 2 Failed - 03/11/2024  9:11 AM      Failed - Valid encounter within last 6 months    Recent Outpatient Visits           2 months ago Seasonal allergies   [facility]ardo [facility]end, DO              Passed - Last BP in normal range    BP Readings from Last 1 Encounters:  12/16/23 130/72         Passed - Last Heart Rate in normal range    Pulse Readings from Last 1 Encounters:  12/16/23 73

## 2024-03-25 ENCOUNTER — Ambulatory Visit: Admitting: Nurse Practitioner

## 2024-04-08 ENCOUNTER — Other Ambulatory Visit: Payer: Self-pay | Admitting: Internal Medicine

## 2024-04-08 DIAGNOSIS — J3489 Other specified disorders of nose and nasal sinuses: Secondary | ICD-10-CM

## 2024-04-08 DIAGNOSIS — J441 Chronic obstructive pulmonary disease with (acute) exacerbation: Secondary | ICD-10-CM

## 2024-04-08 DIAGNOSIS — J432 Centrilobular emphysema: Secondary | ICD-10-CM

## 2024-04-09 ENCOUNTER — Telehealth: Payer: Self-pay

## 2024-04-09 NOTE — Telephone Encounter (Signed)
 [facility] lmovm requesting call back regarding information of patient being faxed to [facility] for pt to establish care with Dr Unk there... She requested guidance on what we wanted [facility] to do.... I called [facility] and let them know pt should be scheduled for a f/u with Dr Unk... pt scheduled for 08/25/2024 at 1pm...    I called pt and lmovm letting her know of the appt and to call [facility] to make changes regarding the appt

## 2024-04-09 NOTE — Telephone Encounter (Signed)
 Requested Prescriptions  Pending Prescriptions Disp Refills   fluticasone -salmeterol (ADVAIR) 250-50 MCG/ACT AEPB [Pharmacy Med Name: fluticasone  250 mcg-salmeterol 50 mcg/dose blistr powdr for inhalation] 180 each 0    Sig: INHALE 1 PUFF INTO THE LUNGS IN THE MORNING AND AT BEDTIME.     Pulmonology:  Combination Products Failed - 04/09/2024  4:40 PM      Failed - Valid encounter within last 12 months    Recent Outpatient Visits           3 months ago Seasonal allergies   [facility] Bernardo Fend, DO               albuterol  (VENTOLIN  HFA) 108 [phone] Base) MCG/ACT inhaler [Pharmacy Med Name: albuterol  sulfate HFA 90 mcg/actuation aerosol inhaler] 18 g 2    Sig: INHALE 2 PUFFS INTO THE LUNGS EVERY 6 HOURS AS NEEDED FOR WHEEZING OR SHORTNESS OF BREATH.     Pulmonology:  Beta Agonists 2 Failed - 04/09/2024  4:40 PM      Failed - Valid encounter within last 12 months    Recent Outpatient Visits           3 months ago Seasonal allergies   [facility] Bernardo Fend, DO              Passed - Last BP in normal range    BP Readings from Last 1 Encounters:  12/16/23 130/72         Passed - Last Heart Rate in normal range    Pulse Readings from Last 1 Encounters:  12/16/23 73

## 2024-04-14 ENCOUNTER — Telehealth: Payer: Self-pay

## 2024-04-14 ENCOUNTER — Other Ambulatory Visit: Payer: Self-pay | Admitting: Internal Medicine

## 2024-04-14 DIAGNOSIS — J432 Centrilobular emphysema: Secondary | ICD-10-CM

## 2024-04-14 DIAGNOSIS — J3489 Other specified disorders of nose and nasal sinuses: Secondary | ICD-10-CM

## 2024-04-14 MED ORDER — FLUTICASONE-SALMETEROL 250-50 MCG/ACT IN AEPB: 1.0000 | INHALATION_SPRAY | Freq: Two times a day (BID) | RESPIRATORY_TRACT | 1 refills | Status: DC

## 2024-04-14 NOTE — Telephone Encounter (Signed)
 Copied from CRM #[id number]. Topic: Clinical - Medication Question >> Apr 14, 2024 12:23 PM Celestine FALCON wrote: Reason for CRM: Pt is requesting to have Dr. Tamea fill a prescription for fluticasone  propionate/salmeterol diskus 250-50 mcg/act aepb instead of Fluticasone -Umeclidin-Vilant (TRELEGY ELLIPTA ) 200-62.5-25 MCG/ACT AEPB. Pt stated she did not end up using Fluticasone -Umeclidin-Vilant (TRELEGY ELLIPTA ) 200-62.5-25 MCG/ACT AEPB due to the side effects (pt googled them) as she didn't feel comfortable taking it.  Pt's preferred pharmacy: [facility] - [place], [place] - [address] [place] [address] Phone: [phone] Fax: [phone] Hours: Not open 24 hours     Pt's phone number: [phone] ok to leave a vm.

## 2024-04-14 NOTE — Telephone Encounter (Signed)
 I notified the patient. She said she did try the Trelegy and had a reaction to it but she could not remember when reaction she had. She asked that I send in the Advair.   I have sent in the Advair.  Nothing further needed.

## 2024-04-14 NOTE — Telephone Encounter (Signed)
 All medications have side effects it does not mean that people are all going to experience them.  She has asthma COPD overlap and the Trelegy would be a better choice for her.  It seems that she did not try the medication.  It is okay to write for Advair if that is what she chooses however this may not control her symptoms fully as the Trelegy would.  The Trelegy is basically Advair with an extra ingredient.  Can provide a 1-month supply of Advair with just 1 refill.

## 2024-04-15 NOTE — Telephone Encounter (Signed)
 Requested medication (s) are due for refill today:   Not sure  Discontinued 08/27/2023.   Requested medication (s) are on the active medication list:   Yes  Future visit scheduled:   Yes 8/22      LOV 12/16/2023   Last ordered: 04/14/2024 3 each, 1 refill  Provider to review.   Discontinued but then looks like it may have been reordered on 04/14/2024.      Requested Prescriptions  Pending Prescriptions Disp Refills   fluticasone -salmeterol (ADVAIR) 250-50 MCG/ACT AEPB [Pharmacy Med Name: fluticasone  250 mcg-salmeterol 50 mcg/dose blistr powdr for inhalation] 180 each 11    Sig: Inhale 1 puff into the lungs in the morning and at bedtime.     Pulmonology:  Combination Products Failed - 04/15/2024  1:32 PM      Failed - Valid encounter within last 12 months    Recent Outpatient Visits           4 months ago Seasonal allergies   [facility] Bernardo Fend, [place]

## 2024-04-22 ENCOUNTER — Ambulatory Visit: Admitting: Internal Medicine

## 2024-05-08 ENCOUNTER — Other Ambulatory Visit: Payer: Self-pay

## 2024-05-08 ENCOUNTER — Encounter: Payer: Self-pay | Admitting: Internal Medicine

## 2024-05-08 ENCOUNTER — Ambulatory Visit (INDEPENDENT_AMBULATORY_CARE_PROVIDER_SITE_OTHER): Admitting: Internal Medicine

## 2024-05-08 VITALS — BP 132/80 | HR 70 | Temp 98.1°F | Resp 16 | Ht 65.0 in | Wt 224.9 lb

## 2024-05-08 DIAGNOSIS — J3489 Other specified disorders of nose and nasal sinuses: Secondary | ICD-10-CM

## 2024-05-08 DIAGNOSIS — L304 Erythema intertrigo: Secondary | ICD-10-CM

## 2024-05-08 MED ORDER — METHYLPREDNISOLONE 4 MG PO TBPK: ORAL_TABLET | ORAL | 0 refills | Status: DC

## 2024-05-08 MED ORDER — NYSTATIN 100000 UNIT/GM EX POWD
1.0000 | Freq: Three times a day (TID) | CUTANEOUS | 0 refills | Status: AC
Start: 2024-05-08 — End: ?

## 2024-05-08 NOTE — Progress Notes (Signed)
 Acute Office Visit  Subjective:     Patient ID: Kellie Phelps, female    DOB: Apr 05, 1946, 78 y.o.   MRN: [id number]  Chief Complaint  Patient presents with   Sinusitis    Runny nose, eyes watery for 3 weeks   Rash    Under bilateral breast for 4 weeks    Sinusitis Associated symptoms include congestion, coughing, headaches and a sore throat. Pertinent negatives include no chills, ear pain or shortness of breath.  Rash Associated symptoms include congestion, coughing and a sore throat. Pertinent negatives include no fever or shortness of breath.   Patient is in today for rash under bilateral breasts and sinus symptoms.  Discussed the use of AI scribe software for clinical note transcription with the patient, who gave verbal consent to proceed.  History of Present Illness Kellie Phelps is a 78 year old female who presents with sinus congestion and a rash under the breast.  She experiences sinus congestion, rhinorrhea, and epiphora, which began after exposure to sheetrock installation and painting during home renovations. She stayed at a hotel during this time. She has nocturnal dyspnea, odynophagia, and pain in the right eye extending to the nose. She uses Zyrtec  and Flonase  for symptom relief.  She has a rash under her breast and in her axillae, previously raw and painful. She avoids A and D ointment due to its greasy nature.  She has an allergy to penicillin. She takes Lexapro , which is due for a refill next month. She has a dog she wishes to have as a support animal for anxiety.    Review of Systems  Constitutional:  Negative for chills and fever.  HENT:  Positive for congestion, sinus pain and sore throat. Negative for ear pain.   Respiratory:  Positive for cough. Negative for sputum production, shortness of breath and wheezing.   Cardiovascular:  Negative for chest pain.  Skin:  Positive for itching and rash.  Neurological:  Positive for headaches.         Objective:    BP 132/80 (Cuff Size: Large)   Pulse 70   Temp 98.1 F (36.7 C) (Oral)   Resp 16   Ht 5' 5 (1.651 m)   Wt 224 lb 14.4 oz (102 kg)   SpO2 95%   BMI 37.43 kg/m  BP Readings from Last 3 Encounters:  05/08/24 132/80  12/16/23 130/72  10/15/23 130/80   Wt Readings from Last 3 Encounters:  05/08/24 224 lb 14.4 oz (102 kg)  12/16/23 224 lb (101.6 kg)  10/15/23 220 lb 12.8 oz (100.2 kg)      Physical Exam Constitutional:      Appearance: Normal appearance.  HENT:     Head: Normocephalic and atraumatic.     Right Ear: Tympanic membrane, ear canal and external ear normal.     Left Ear: Tympanic membrane, ear canal and external ear normal.     Nose: Congestion present.     Mouth/Throat:     Mouth: Mucous membranes are moist.     Pharynx: Oropharynx is clear.  Eyes:     Conjunctiva/sclera: Conjunctivae normal.  Cardiovascular:     Rate and Rhythm: Normal rate and regular rhythm.  Pulmonary:     Effort: Pulmonary effort is normal.     Breath sounds: Normal breath sounds. No wheezing, rhonchi or rales.  Skin:    General: Skin is warm and dry.  Neurological:     General: No focal deficit present.  Mental Status: She is alert. Mental status is at baseline.  Psychiatric:        Mood and Affect: Mood normal.        Behavior: Behavior normal.     No results found for any visits on 05/08/24.      Assessment & Plan:   Assessment and Plan Assessment & Plan Allergic rhinitis Chronic allergic rhinitis with rhinorrhea, epiphora, and sinus pressure, exacerbated by environmental changes. No bacterial infection, antibiotics not indicated. - Continue Zyrtec  and Flonase . - Prescribe short course of steroids for pressure and congestion. - Advise using distilled or boiled water for nasal rinse. - Send prescriptions to [facility] pharmacy.  Intertrigo due to candidal infection Intertrigo under breast and axillae likely due to candidal infection.  Emphasized keeping area dry to prevent recurrence. - Prescribe medicated powder (niacinamide). - Advise keeping affected areas dry using pat drying and air drying after showering. - Send prescription to [facility] pharmacy.  Goals of Care Discussed need for emotional support animal letter for anxiety. Explained letters must be provided by mental health professional, not family practice provider. - Recommend contacting insurance for therapist options and offer assistance if referral needed.  - methylPREDNISolone  (MEDROL  DOSEPAK) 4 MG TBPK tablet; Use as directed.  Dispense: 21 each; Refill: 0 - nystatin  (MYCOSTATIN /NYSTOP ) powder; Apply 1 Application topically 3 (three) times daily.  Dispense: 15 g; Refill: 0   Return for already scheduled.  Sharyle Fischer, DO

## 2024-06-07 ENCOUNTER — Other Ambulatory Visit: Payer: Self-pay | Admitting: Internal Medicine

## 2024-06-07 DIAGNOSIS — I10 Essential (primary) hypertension: Secondary | ICD-10-CM

## 2024-06-08 NOTE — Telephone Encounter (Signed)
 Requested Prescriptions  Pending Prescriptions Disp Refills   amLODipine  (NORVASC ) 5 MG tablet [Pharmacy Med Name: amlodipine  5 mg tablet] 90 tablet 0    Sig: TAKE ONE TABLET BY MOUTH DAILY.     Cardiovascular: Calcium  Channel Blockers 2 Passed - 06/08/2024  3:24 PM      Passed - Last BP in normal range    BP Readings from Last 1 Encounters:  05/08/24 132/80         Passed - Last Heart Rate in normal range    Pulse Readings from Last 1 Encounters:  05/08/24 70         Passed - Valid encounter within last 6 months    Recent Outpatient Visits           1 month ago Sinus pressure   [facility] Bernardo Fend, DO   5 months ago Seasonal allergies   [facility] Bernardo Fend, [place]

## 2024-06-22 ENCOUNTER — Encounter: Payer: Self-pay | Admitting: Internal Medicine

## 2024-06-22 ENCOUNTER — Other Ambulatory Visit: Payer: Self-pay

## 2024-06-22 ENCOUNTER — Ambulatory Visit (INDEPENDENT_AMBULATORY_CARE_PROVIDER_SITE_OTHER): Admitting: Internal Medicine

## 2024-06-22 VITALS — BP 130/82 | HR 74 | Temp 98.3°F | Resp 16 | Ht 65.0 in | Wt 227.5 lb

## 2024-06-22 DIAGNOSIS — I2510 Atherosclerotic heart disease of native coronary artery without angina pectoris: Secondary | ICD-10-CM | POA: Diagnosis not present

## 2024-06-22 DIAGNOSIS — R7303 Prediabetes: Secondary | ICD-10-CM | POA: Diagnosis not present

## 2024-06-22 DIAGNOSIS — J329 Chronic sinusitis, unspecified: Secondary | ICD-10-CM

## 2024-06-22 DIAGNOSIS — K219 Gastro-esophageal reflux disease without esophagitis: Secondary | ICD-10-CM

## 2024-06-22 DIAGNOSIS — I10 Essential (primary) hypertension: Secondary | ICD-10-CM | POA: Diagnosis not present

## 2024-06-22 DIAGNOSIS — E782 Mixed hyperlipidemia: Secondary | ICD-10-CM | POA: Diagnosis not present

## 2024-06-22 DIAGNOSIS — F419 Anxiety disorder, unspecified: Secondary | ICD-10-CM

## 2024-06-22 DIAGNOSIS — N3946 Mixed incontinence: Secondary | ICD-10-CM

## 2024-06-22 LAB — POCT GLYCOSYLATED HEMOGLOBIN (HGB A1C): Hemoglobin A1C: 5.7 % — AB (ref 4.0–5.6)

## 2024-06-22 MED ORDER — AMLODIPINE BESYLATE 5 MG PO TABS: 5.0000 mg | ORAL_TABLET | Freq: Every day | ORAL | 1 refills | Status: AC

## 2024-06-22 MED ORDER — ESCITALOPRAM OXALATE 10 MG PO TABS
10.0000 mg | ORAL_TABLET | Freq: Every day | ORAL | 1 refills | Status: AC
Start: 2024-06-22 — End: ?

## 2024-06-22 MED ORDER — SIMVASTATIN 40 MG PO TABS: 40.0000 mg | ORAL_TABLET | Freq: Every day | ORAL | 1 refills | Status: AC

## 2024-06-22 MED ORDER — PANTOPRAZOLE SODIUM 40 MG PO TBEC: 40.0000 mg | DELAYED_RELEASE_TABLET | Freq: Every day | ORAL | 1 refills | Status: AC

## 2024-06-22 NOTE — Progress Notes (Signed)
 Established Patient Office Visit  Subjective    Patient ID: Kellie Phelps, female    DOB: 07-May-1946  Age: 78 y.o. MRN: [id number]  CC:  Chief Complaint  Patient presents with   Medical Management of Chronic Issues    6 month recheck   URI    HPI Kellie Phelps presents for follow up on chronic medical conditions.   Discussed the use of AI scribe software for clinical note transcription with the patient, who gave verbal consent to proceed.  History of Present Illness Kellie Phelps is a 78 year old female who presents for a follow-up visit and medication review.  Her A1c levels are due for a check, as it has been over six months since her last test. She is currently taking amlodipine , which requires a refill. Her cholesterol medication is also due for a refill, with good levels noted in March.  She follows up with pulmonology and reverted to using Advair and albuterol  after experiencing headaches with Trelegy. Her pantoprazole  for stomach issues and Lexapro  are both due for refills, and she is stable on Lexapro .  She experiences urinary incontinence at night, described as leaking, and uses pads and gloves covered by insurance. She denies symptoms of a urinary tract infection and can sense bladder fullness. Occasional stress incontinence occurs with sneezing or laughing.  She has allergy symptoms, including coughing, watery eyes, and throat pain, attributed to fall weather changes. She uses Zyrtec  and was previously prescribed Flonase .   Hypertension/Diastolic HF: -Medications: Amlodipine  5 mg, Metoprolol  25 mg, Nitroglycerin  PRN -Patient is compliant with above medications and reports no side effects. -Checking BP at home (average): 120-130/60-70, stable  -Denies any SOB, CP, vision changes, LE edema or symptoms of hypotension -Follows with cardiology in [place], had a stress test on 10/13/21, EF 62% with small reversible defect of the  anteroseptal wall in the apical and mid segments but otherwise normal.  -Last echo 05/12/2019 showing EF 60-65% with left ventricular diastolic impaired relaxation.   HLD/CAD: -Medications: Zocor  40 mg, aspirin  81 mg, fish oil  -Patient is compliant with above medications and reports no side effects.  -Last lipid panel: Lipid Panel     Component Value Date/Time   CHOL 139 12/16/2023 0833   CHOL 155 03/31/2015 1439   TRIG 98 12/16/2023 0833   HDL 51 12/16/2023 0833   HDL 45 03/31/2015 1439   CHOLHDL 2.7 12/16/2023 0833   VLDL 20 01/16/2023 0441   LDLCALC 70 12/16/2023 0833   LABVLDL 43 (H) 03/31/2015 1439    COPD/Asthma: -Following with [facility] -COPD status: better -Current medications: Advair, Albuterol  PRN. -Tried Trelegy but could not tolerate it due to headaches, prefers to continue Advair  -Satisfied with current treatment?: yes -Oxygen use: no -Dyspnea frequency: with exertion occasionally -Cough frequency: non-productive currently -Rescue inhaler frequency:  about twice a day  -Wheezing: No -Limitation of activity: no -Productive cough: no -Last Spirometry/PFTs: 2019 -Pneumovax: Not up to Date, continues to politely decline -Influenza: Not up to Date  GERD: -Currently on Pantoprazole  40 mg -Had EGD on 05/25/2022 which showed diffuse mildly erythematous mucosa without bleeding in the gastric fundus and gastric body.  Biopsy showed esophageal candidiasis and she was treated with oral fluconazole .  Glaucoma: -Currently on Dorzolamide  2% ophthalmic solution, following with Dr. Carolee  -Had both cataracts done in 2023, doing well   Seasonal Allergies: -Currently using Flonase  and Zyrtec  on and off, has allergy symptoms currently   Anxiety: -Duration:stable -Currently on  Lexapro  10 mg daily  -She is compliant with medications and denies side effects. -Had been on Diazepam in the past, discussed how this medication would not be prescribed here. She was given  Hydroxyzine  10 mg previously for as needed anxiety, she took it once and felt very sleepy.      12/16/2023    8:08 AM 10/15/2023   10:03 AM 07/15/2023    9:17 AM 05/30/2023    4:00 PM 05/07/2023    8:10 AM  Depression screen PHQ 2/9  Decreased Interest 0 0 0 0 0  Down, Depressed, Hopeless 0 0 0 0 0  PHQ - 2 Score 0 0 0 0 0  Altered sleeping   0  0  Tired, decreased energy   0  0  Change in appetite   0  0  Feeling bad or failure about yourself    0  0  Trouble concentrating   0  0  Moving slowly or fidgety/restless   0  0  Suicidal thoughts   0  0  PHQ-9 Score   0  0  Difficult doing work/chores   Not difficult at all  Not difficult at all    Pre-Diabetes: -A1c 5/24 5.7% -Not currently on medication   Urinary Incontinence:  -Needing pads in the bed due to urinary leakage, worse at night -Does not lose full contents of bladder -Describes symptoms worse with laughing, sneezing, etc but also some overflow at night -Denies current UTI symptoms or bladder spasms   Health Maintenance: -Blood work UTD -Mammogram 11/23, Birads-1 -Declines all vaccines    Outpatient Encounter Medications as of 06/22/2024  Medication Sig   albuterol  (PROVENTIL ) (2.5 MG/3ML) 0.083% nebulizer solution Take 3 mLs (2.5 mg total) by nebulization every 6 (six) hours as needed for wheezing or shortness of breath.   albuterol  (VENTOLIN  HFA) 108 (90 Base) MCG/ACT inhaler INHALE 2 PUFFS INTO THE LUNGS EVERY 6 HOURS AS NEEDED FOR WHEEZING OR SHORTNESS OF BREATH.   amLODipine  (NORVASC ) 5 MG tablet TAKE ONE TABLET BY MOUTH DAILY.   aspirin  81 MG tablet Take 81 mg by mouth daily.   Calcium  Carbonate-Vitamin D 600-200 MG-UNIT TABS Take by mouth.   cetirizine  (ZYRTEC ) 10 MG tablet Take 1 tablet (10 mg total) by mouth daily.   dorzolamide  (TRUSOPT ) 2 % ophthalmic solution Place 1 drop into both eyes 2 (two) times daily.   escitalopram  (LEXAPRO ) 10 MG tablet Take 1 tablet (10 mg total) by mouth daily.   fluticasone   (FLONASE ) 50 MCG/ACT nasal spray Place 2 sprays into both nostrils daily.   fluticasone -salmeterol (ADVAIR) 250-50 MCG/ACT AEPB Inhale 1 puff into the lungs in the morning and at bedtime.   methylPREDNISolone  (MEDROL  DOSEPAK) 4 MG TBPK tablet Use as directed.   metoprolol  succinate (TOPROL -XL) 25 MG 24 hr tablet Take 1 tablet (25 mg total) by mouth daily.   Multiple Vitamins-Minerals (ONE-A-DAY WOMENS 50 PLUS PO) Take by mouth daily.   nitroGLYCERIN  (NITROSTAT ) 0.4 MG SL tablet Place 1 tablet (0.4 mg total) under the tongue every 5 (five) minutes as needed for chest pain.   nystatin  (MYCOSTATIN /NYSTOP ) powder Apply 1 Application topically 3 (three) times daily.   Omega-3 Fatty Acids (FISH OIL) 1000 MG CAPS 1,000 mg. Take two tablets by mouth daily.   pantoprazole  (PROTONIX ) 40 MG tablet Take 1 tablet (40 mg total) by mouth daily.   simvastatin  (ZOCOR ) 40 MG tablet Take 1 tablet (40 mg total) by mouth daily.   triamcinolone (KENALOG) 0.025 %  cream Apply 1 Application topically 2 (two) times daily.   VYZULTA 0.024 % SOLN Place 1 drop into both eyes every evening.   No facility-administered encounter medications on file as of 06/22/2024.    Past Medical History:  Diagnosis Date   Allergy    Anxiety    Asthma    Atypical chest pain    Cataract    COPD (chronic obstructive pulmonary disease) (HCC)    Coronary artery disease, non-occlusive    a. LHC 6/08 EF 55-60%, 20-30% proximal LAD, LIs CFX, LIs RCA (Harwani); b. ETT-myoview  11/10, 3' stopped due to fatigue and chest tightness, EF normal, probably normal perfusion images with minimal soft tissue attenuation   Diastolic dysfunction    a. echo 2010: EF 60-65%, GR1DD, no AI, trivial MR, LA nl   GERD (gastroesophageal reflux disease)    Glaucoma    Hyperglycemia    Hyperlipidemia    Hypertension     Past Surgical History:  Procedure Laterality Date   ESOPHAGOGASTRODUODENOSCOPY (EGD) WITH PROPOFOL  N/A 05/25/2022   Procedure:  ESOPHAGOGASTRODUODENOSCOPY (EGD) WITH PROPOFOL ;  Surgeon: Unk Corinn Skiff, MD;  Location: [facility] ENDOSCOPY;  Service: Gastroenterology;  Laterality: N/A;  RIDE IS ABOUT 20 MINUTES OUT   TONSILLECTOMY      Family History  Problem Relation Age of Onset   Uterine cancer Mother    Heart attack Father    Pulmonary embolism Brother    Hepatitis Brother    Coronary artery disease Brother    Coronary artery disease Other        Aunt    Social History   Socioeconomic History   Marital status: Single    Spouse name: Not on file   Number of children: Not on file   Years of education: Not on file   Highest education level: 12th grade  Occupational History   Occupation: Retired    Associate Professor: RETIRED  Tobacco Use   Smoking status: Former    Current packs/day: 0.00    Average packs/day: 0.5 packs/day for 5.0 years (2.5 ttl pk-yrs)    Types: Cigarettes    Start date: 09/17/1973    Quit date: 09/17/1978    Years since quitting: 45.7   Smokeless tobacco: Never   Tobacco comments:    unsure how long she smoked for--10/27/2020  Vaping Use   Vaping status: Never Used  Substance and Sexual Activity   Alcohol use: No   Drug use: No   Sexual activity: Not Currently    Partners: Male    Birth control/protection: Post-menopausal  Other Topics Concern   Not on file  Social History Narrative   Divorced   Lives alone   No children   Does not get regular exercise   Social Drivers of Health   Financial Resource Strain: Low Risk  (03/21/2024)   Overall Financial Resource Strain (CARDIA)    Difficulty of Paying Living Expenses: Not hard at all  Food Insecurity: No Food Insecurity (03/21/2024)   Hunger Vital Sign    Worried About Running Out of Food in the Last Year: Never true    Ran Out of Food in the Last Year: Never true  Transportation Needs: No Transportation Needs (03/21/2024)   PRAPARE - Administrator, Civil Service (Medical): No    Lack of Transportation (Non-Medical): No   Physical Activity: Insufficiently Active (03/21/2024)   Exercise Vital Sign    Days of Exercise per Week: 5 days    Minutes of Exercise per Session: 10  min  Stress: No Stress Concern Present (03/21/2024)   [employer] of Occupational Health - Occupational Stress Questionnaire    Feeling of Stress: Not at all  Social Connections: Socially Isolated (03/21/2024)   Social Connection and Isolation Panel    Frequency of Communication with Friends and Family: Once a week    Frequency of Social Gatherings with Friends and Family: Once a week    Attends Religious Services: 1 to 4 times per year    Active Member of [employer] or Organizations: No    Attends Engineer, structural: Not on file    Marital Status: Divorced  Intimate Partner Violence: Not At Risk (07/05/2022)   Humiliation, Afraid, Rape, and Kick questionnaire    Fear of Current or Ex-Partner: No    Emotionally Abused: No    Physically Abused: No    Sexually Abused: No    Review of Systems  Constitutional:  Negative for chills and fever.  HENT:  Positive for congestion and sore throat. Negative for ear pain and sinus pain.   Respiratory:  Positive for cough. Negative for sputum production, shortness of breath and wheezing.   Genitourinary:  Positive for urgency. Negative for dysuria, frequency and hematuria.      Objective    BP 130/82 (Cuff Size: Large)   Pulse 74   Temp 98.3 F (36.8 C) (Oral)   Resp 16   Ht 5' 5 (1.651 m)   Wt 227 lb 8 oz (103.2 kg)   SpO2 97%   BMI 37.86 kg/m   Physical Exam Constitutional:      Appearance: Normal appearance.  HENT:     Head: Normocephalic and atraumatic.     Right Ear: Tympanic membrane, ear canal and external ear normal.     Left Ear: Tympanic membrane, ear canal and external ear normal.     Nose: Rhinorrhea present.     Mouth/Throat:     Mouth: Mucous membranes are moist.     Pharynx: Oropharynx is clear.  Eyes:     Conjunctiva/sclera: Conjunctivae normal.   Cardiovascular:     Rate and Rhythm: Normal rate and regular rhythm.  Pulmonary:     Effort: Pulmonary effort is normal.     Breath sounds: Normal breath sounds. No wheezing, rhonchi or rales.  Skin:    General: Skin is warm and dry.  Neurological:     General: No focal deficit present.     Mental Status: She is alert. Mental status is at baseline.  Psychiatric:        Mood and Affect: Mood normal.        Behavior: Behavior normal.       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 Prefers bed pads and gloves for management. - Document urinary incontinence for insurance coverage of supplies. - Educated on Kegel exercises to strengthen pelvic floor muscles. - Discussed pelvic floor physical therapy as a potential option. - Avoid medications for overactive bladder due to potential interactions and side effects.  Prediabetes Previously identified in the prediabetic range. Monitoring A1c levels for stability. - Check A1c level today which was improved at 5.7%.  Hypertension Blood pressure well-controlled at 130/82 mmHg on amlodipine . - Refill amlodipine  prescription.  Hyperlipidemia Cholesterol levels were good at last check in March. On cholesterol medication. - Refill cholesterol medication.  Chronic obstructive pulmonary disease (COPD) Previously  tried Trelegy but experienced headaches, leading to discontinuation. Using Advair and albuterol . Pulmonologist recommended Trelegy, but unable to tolerate it due to side effects. - Continue current inhalers (Advair and albuterol ).  Gastroesophageal reflux disease (GERD) Managed with pantoprazole . - Refill pantoprazole  prescription.  Depressive disorder Managed with Lexapro . - Refill Lexapro  prescription.  Allergic rhinitis Symptoms include coughing, watery eyes,  and throat pain, likely due to allergies. Considering switch to Azelastine nasal spray for better control. - Recommend daily Zyrtec  for allergy management. - Recommend Azelastine nasal spray (AstroPro) for allergies, available over the counter.  General Health Maintenance Discussed vaccinations, specifically flu and pneumonia vaccines. Declined due to current cold symptoms, likely allergic rather than viral. - Document decision to decline flu and pneumonia vaccines.  - amLODipine  (NORVASC ) 5 MG tablet; Take 1 tablet (5 mg total) by mouth daily.  Dispense: 90 tablet; Refill: 1 - simvastatin  (ZOCOR ) 40 MG tablet; Take 1 tablet (40 mg total) by mouth daily.  Dispense: 90 tablet; Refill: 1 - pantoprazole  (PROTONIX ) 40 MG tablet; Take 1 tablet (40 mg total) by mouth daily.  Dispense: 90 tablet; Refill: 1 - escitalopram  (LEXAPRO ) 10 MG tablet; Take 1 tablet (10 mg total) by mouth daily.  Dispense: 90 tablet; Refill: 1 - POCT HgB A1C - For home use only DME Other see comment   Return in about 6 months (around 12/21/2024).   Sharyle Fischer, DO

## 2024-06-22 NOTE — Patient Instructions (Signed)
Kegel Exercises  Kegel exercises can help strengthen your pelvic floor muscles. The pelvic floor is a group of muscles that support your rectum, small intestine, and bladder. In females, pelvic floor muscles also help support the uterus. These muscles help you control the flow of urine and stool (feces). Kegel exercises are painless and simple. They do not require any equipment. Your provider may suggest Kegel exercises to: Improve bladder and bowel control. Improve sexual response. Improve weak pelvic floor muscles after surgery to remove the uterus (hysterectomy) or after pregnancy, in females. Improve weak pelvic floor muscles after prostate gland removal or surgery, in males. Kegel exercises involve squeezing your pelvic floor muscles. These are the same muscles you squeeze when you try to stop the flow of urine or keep from passing gas. The exercises can be done while sitting, standing, or lying down, but it is best to vary your position. Ask your health care provider which exercises are safe for you. Do exercises exactly as told by your health care provider and adjust them as directed. Do not begin these exercises until told by your health care provider. Exercises How to do Kegel exercises: Squeeze your pelvic floor muscles tight. You should feel a tight lift in your rectal area. If you are a female, you should also feel a tightness in your vaginal area. Keep your stomach, buttocks, and legs relaxed. Hold the muscles tight for up to 10 seconds. Breathe normally. Relax your muscles for up to 10 seconds. Repeat as told by your health care provider. Repeat this exercise daily as told by your health care provider. Continue to do this exercise for at least 4-6 weeks, or for as long as told by your health care provider. You may be referred to a physical therapist who can help you learn more about how to do Kegel exercises. Depending on your condition, your health care provider may  recommend: Varying how long you squeeze your muscles. Doing several sets of exercises every day. Doing exercises for several weeks. Making Kegel exercises a part of your regular exercise routine. This information is not intended to replace advice given to you by your health care provider. Make sure you discuss any questions you have with your health care provider. Document Revised: 01/12/2021 Document Reviewed: 01/12/2021 Elsevier Patient Education  2024 Elsevier Inc.  

## 2024-06-26 ENCOUNTER — Telehealth: Payer: Self-pay | Admitting: Internal Medicine

## 2024-06-26 NOTE — Telephone Encounter (Signed)
 Copied from CRM [id number]. Topic: General - Other >> Jun 26, 2024  1:02 PM Zebedee SAUNDERS wrote: Reason for CRM: Pt called stated [employer] needs medicare paperwork sign by Dr. Bernardo and please fax to [employer]. Pt need incontinent supplies.

## 2024-06-29 NOTE — Telephone Encounter (Signed)
Paperwork faxed back

## 2024-07-07 ENCOUNTER — Other Ambulatory Visit: Payer: Self-pay | Admitting: Internal Medicine

## 2024-07-07 ENCOUNTER — Other Ambulatory Visit: Payer: Self-pay | Admitting: Pulmonary Disease

## 2024-07-07 DIAGNOSIS — J302 Other seasonal allergic rhinitis: Secondary | ICD-10-CM

## 2024-07-07 DIAGNOSIS — J441 Chronic obstructive pulmonary disease with (acute) exacerbation: Secondary | ICD-10-CM

## 2024-07-07 DIAGNOSIS — J069 Acute upper respiratory infection, unspecified: Secondary | ICD-10-CM

## 2024-07-07 NOTE — Telephone Encounter (Signed)
 Copied from CRM [id number]. Topic: Clinical - Medication Refill >> Jul 07, 2024 12:29 PM Sophia H wrote: Medication: fluticasone  (FLONASE ) 50 MCG/ACT nasal spray   Has the patient contacted their pharmacy? Yes, pharmacy states they faxed request with no response.   This is the patient's preferred pharmacy:  [facility] - [place], [place] - [address] [place]  [address] Phone: [phone] Fax: [phone]  Is this the correct pharmacy for this prescription? Yes If no, delete pharmacy and type the correct one.   Has the prescription been filled recently? Yes  Is the patient out of the medication? Yes  Has the patient been seen for an appointment in the last year OR does the patient have an upcoming appointment? Yes, seen on 10/06.  Can we respond through MyChart? Yes  Agent: Please be advised that Rx refills may take up to 3 business days. We ask that you follow-up with your pharmacy.

## 2024-07-09 NOTE — Telephone Encounter (Signed)
 Requested medications are due for refill today.  yes  Requested medications are on the active medications list.  yes  Last refill. 08/27/2023 16g 6 rf  Future visit scheduled.   no  Notes to clinic.  Rx signed by Dr. Tamea.    Requested Prescriptions  Pending Prescriptions Disp Refills   fluticasone  (FLONASE ) 50 MCG/ACT nasal spray 16 g 6    Sig: Place 2 sprays into both nostrils daily.     Ear, Nose, and Throat: Nasal Preparations - Corticosteroids Passed - 07/09/2024 12:03 PM      Passed - Valid encounter within last 12 months    Recent Outpatient Visits           2 weeks ago Essential hypertension   [facility] Bernardo Fend, [facility]   2 months ago Sinus pressure   [facility] Bernardo Fend, [place]   6 months ago Seasonal allergies   [facility] Bernardo Fend, [place]

## 2024-07-09 NOTE — Telephone Encounter (Signed)
 Requested Prescriptions  Pending Prescriptions Disp Refills   albuterol  (VENTOLIN  HFA) 108 (90 Base) MCG/ACT inhaler [Pharmacy Med Name: albuterol  sulfate HFA 90 mcg/actuation aerosol inhaler] 18 g 0    Sig: INHALE 2 PUFFS INTO THE LUNGS EVERY 6 HOURS AS NEEDED FOR WHEEZING OR SHORTNESS OF BREATH.     Pulmonology:  [facility] 2 Passed - 07/09/2024  8:59 AM      Passed - Last BP in normal range    BP Readings from Last 1 Encounters:  06/22/24 130/82         Passed - Last Heart Rate in normal range    Pulse Readings from Last 1 Encounters:  06/22/24 74         Passed - Valid encounter within last 12 months    Recent Outpatient Visits           2 weeks ago Essential hypertension   [facility] Bernardo Fend, DO   2 months ago Sinus pressure   [facility] Bernardo Fend, DO   6 months ago Seasonal allergies   [facility] Bernardo Fend, [place]

## 2024-07-27 ENCOUNTER — Ambulatory Visit: Admitting: Pulmonary Disease

## 2024-07-28 ENCOUNTER — Ambulatory Visit: Payer: Self-pay

## 2024-07-28 NOTE — Telephone Encounter (Signed)
 Patient states that her Allergic rhinitis continues to bother her, states it is not r/t bronchitis (which was documented by PAS).   Requires 3 day notice for ride if provider requires, is asking if we can rx flonase , Azelastine and cough suppressant. States cannot afford OTC d/t loss of food stamps.   Symptoms include runny nose, itchy eyes, cough. States same as visit on 06/22/24. Denies SOB.   FYI Only or Action Required?: Action required by provider: medication refill request.  Patient was last seen in primary care on 06/22/2024 by Bernardo Fend, DO.  Called Nurse Triage reporting Allergies.  Symptoms began states is chronic.  Interventions attempted: OTC medications: flonase .  Symptoms are: unchanged.  Triage Disposition: Discuss With PCP and Callback by Nurse Today  Patient/caregiver understands and will follow disposition?: Yes  Message from Cleave MATSU sent at 07/28/2024 11:02 AM EST  Reason for Triage: pt said she's been seeing Dr. Bernardo for bronchitis and its just keep getting worse she wants to know if she can send her something in for it instead of having to keep coming in for an appt.

## 2024-07-28 NOTE — Telephone Encounter (Signed)
 Answer Assessment - Initial Assessment Questions 1. SYMPTOM: What's the main symptom you're concerned about? (e.g., runny nose, stuffiness, sneezing, itching)     Runny nose, itchy eyes, cough  3. EYES: Are your eyes also red, watery, and itchy?      Itchy and watery  4. TRIGGER: What pollen or other allergic substance do you think is causing the symptoms?      Chronic seasonal  5. TREATMENT: What medicine are you using? What medicine worked best in the past?     Ran out of flonase   6. OTHER SYMPTOMS: Do you have any other symptoms? (e.g., coughing, difficulty breathing, wheezing)     Denies  Protocols used: Nasal Allergies (Hay Fever)-A-AH

## 2024-07-30 ENCOUNTER — Ambulatory Visit: Payer: Self-pay

## 2024-07-30 NOTE — Telephone Encounter (Signed)
 Patient returned call to clinic staff to accept work in appointment, when PAS called to [facility] they were informed that appointment today is no longer available and advised PAS to schedule next available with provider, PAS transferred caller to nurse triage for scheduling due to acute symptoms. Next available acute appointment with pcp is scheduled on 08/03/24. Patient aware of uc/ed precautions.

## 2024-07-30 NOTE — Telephone Encounter (Signed)
  FYI Only or Action Required?: Action required by provider: request for appointment and medication refill request.  Patient was last seen in primary care on 06/22/2024 by Bernardo Fend, DO.  Called Kellie Phelps reporting Cough and Sore Throat.  Symptoms began a week ago.  Interventions attempted: OTC medications: cough drops and Rest, hydration, or home remedies.  Symptoms are: gradually worsening.  Phelps Disposition: See Physician Within 24 Hours  Patient/caregiver understands and will follow disposition?: Yes, but will wait    Copied from CRM [id number]. Topic: Clinical - Red Word Phelps >> Jul 30, 2024  8:54 AM Kellie Phelps wrote: Red Word that prompted transfer to Kellie Phelps: Runny nose, itchy eyes, very painful sore throat , coughing Reason for Disposition  [1] Known COPD or other severe lung disease (i.e., bronchiectasis, cystic fibrosis, lung surgery) AND [2] symptoms getting worse (i.e., increased sputum purulence or amount, increased breathing difficulty  Answer Assessment - Initial Assessment Questions Additional info: 1) Patient has been calling in for refill of allergies medication but has been denied due to needing an appointment, she states clinic did not return her call to let her know or offer recommendations.  She is also in need of an acute appointment but nothing is available in clinic until December, patient is requesting to be worked into PCP schedule since she cannot have refills until an appointment, she also has travel constraints and needs 3 days notice for transport, she is requesting work in appointment in pcp schedule on 11/17, 11/18, or 11/19 for med refills and uri symptoms. Please return call to patient today.  2) Patient will proceed to urgent care if symptoms are worsening.     1. ONSET: When did the cough begin?      Several days 2. SEVERITY: How bad is the cough today?      mild 3. SPUTUM: Describe the color of your sputum (e.g., none, dry  cough; clear, white, yellow, green)     Post nasal drip 4. HEMOPTYSIS: Are you coughing up any blood? If Yes, ask: How much? (e.g., flecks, streaks, tablespoons, etc.)     Denies  5. DIFFICULTY BREATHING: Are you having difficulty breathing? If Yes, ask: How bad is it? (e.g., mild, moderate, severe)      denies 6. FEVER: Do you have a fever? If Yes, ask: What is your temperature, how was it measured, and when did it start?     denies 7. CARDIAC HISTORY: Do you have any history of heart disease? (e.g., heart attack, congestive heart failure)       8. LUNG HISTORY: Do you have any history of lung disease?  (e.g., pulmonary embolus, asthma, emphysema)      9. PE RISK FACTORS: Do you have a history of blood clots? (or: recent major surgery, recent prolonged travel, bedridden)      10. OTHER SYMPTOMS: Do you have any other symptoms? (e.g., runny nose, wheezing, chest pain)      Sore throat  Protocols used: Cough - Acute Productive-A-AH

## 2024-07-30 NOTE — Telephone Encounter (Signed)
 Called pt twice to get her on todays schedule with Dr Bernardo. Left a VM for pt to call back

## 2024-07-30 NOTE — Telephone Encounter (Signed)
 LFT detailed vm

## 2024-08-03 ENCOUNTER — Ambulatory Visit: Admitting: Internal Medicine

## 2024-08-06 ENCOUNTER — Other Ambulatory Visit: Payer: Self-pay | Admitting: Internal Medicine

## 2024-08-06 DIAGNOSIS — J441 Chronic obstructive pulmonary disease with (acute) exacerbation: Secondary | ICD-10-CM

## 2024-08-06 DIAGNOSIS — J302 Other seasonal allergic rhinitis: Secondary | ICD-10-CM

## 2024-08-06 DIAGNOSIS — J069 Acute upper respiratory infection, unspecified: Secondary | ICD-10-CM

## 2024-08-06 NOTE — Telephone Encounter (Signed)
 Copied from CRM [id number]. Topic: Clinical - Medication Refill >> Aug 06, 2024  1:59 PM Jakyia R wrote: Medication: fluticasone  (FLONASE ) 50 MCG/ACT nasal spray [[id number]]  Has the patient contacted their pharmacy? Yes (Agent: If no, request that the patient contact the pharmacy for the refill. If patient does not wish to contact the pharmacy document the reason why and proceed with request.) (Agent: If yes, when and what did the pharmacy advise?)  This is the patient's preferred pharmacy:  [facility] - [place], [place] - [address] [place] [address] Phone: [phone] Fax: [phone]  Is this the correct pharmacy for this prescription? Yes If no, delete pharmacy and type the correct one.   Has the prescription been filled recently? Yes via refill   Is the patient out of the medication? Yes  Has the patient been seen for an appointment in the last year OR does the patient have an upcoming appointment? Yes  Can we respond through MyChart? Yes  Agent: Please be advised that Rx refills may take up to 3 business days. We ask that you follow-up with your pharmacy.

## 2024-08-07 ENCOUNTER — Ambulatory Visit (INDEPENDENT_AMBULATORY_CARE_PROVIDER_SITE_OTHER): Admitting: Nurse Practitioner

## 2024-08-07 ENCOUNTER — Encounter: Payer: Self-pay | Admitting: Nurse Practitioner

## 2024-08-07 VITALS — BP 130/70 | HR 71 | Temp 98.2°F | Ht 65.0 in | Wt 229.0 lb

## 2024-08-07 DIAGNOSIS — J069 Acute upper respiratory infection, unspecified: Secondary | ICD-10-CM | POA: Diagnosis not present

## 2024-08-07 DIAGNOSIS — J4489 Other specified chronic obstructive pulmonary disease: Secondary | ICD-10-CM

## 2024-08-07 DIAGNOSIS — J302 Other seasonal allergic rhinitis: Secondary | ICD-10-CM

## 2024-08-07 DIAGNOSIS — J441 Chronic obstructive pulmonary disease with (acute) exacerbation: Secondary | ICD-10-CM | POA: Diagnosis not present

## 2024-08-07 MED ORDER — FLUTICASONE PROPIONATE 50 MCG/ACT NA SUSP: 2.0000 | Freq: Every day | NASAL | 6 refills | Status: AC

## 2024-08-07 MED ORDER — CETIRIZINE HCL 10 MG PO TABS
10.0000 mg | ORAL_TABLET | Freq: Every day | ORAL | 11 refills | Status: AC
Start: 2024-08-07 — End: ?

## 2024-08-07 MED ORDER — PREDNISONE 20 MG PO TABS: 40.0000 mg | ORAL_TABLET | Freq: Every day | ORAL | 0 refills | Status: AC

## 2024-08-07 MED ORDER — BENZONATATE 100 MG PO CAPS: 200.0000 mg | ORAL_CAPSULE | Freq: Two times a day (BID) | ORAL | 0 refills | Status: DC | PRN

## 2024-08-07 NOTE — Telephone Encounter (Signed)
 Too soon for refill.  Requested Prescriptions  Pending Prescriptions Disp Refills   albuterol  (VENTOLIN  HFA) 108 (90 Base) MCG/ACT inhaler [Pharmacy Med Name: albuterol  sulfate HFA 90 mcg/actuation aerosol inhaler] 18 g 0    Sig: INHALE TWO PUFFS INTO THE LUNGS EVERY SIX HOURS AS NEEDED FOR WHEEZING OR SHORTNESS OF BREATH.     Pulmonology:  Beta Agonists 2 Passed - 08/07/2024  1:52 PM      Passed - Last BP in normal range    BP Readings from Last 1 Encounters:  06/22/24 130/82         Passed - Last Heart Rate in normal range    Pulse Readings from Last 1 Encounters:  06/22/24 74         Passed - Valid encounter within last 12 months    Recent Outpatient Visits           1 month ago Essential hypertension   [facility] Bernardo Fend, DO   3 months ago Sinus pressure   [facility] Bernardo Fend, DO   7 months ago Seasonal allergies   [facility] Bernardo Fend, [place]

## 2024-08-07 NOTE — Progress Notes (Signed)
 BP 130/70   Pulse 71   Temp 98.2 F (36.8 C)   Ht 5' 5 (1.651 m)   Wt 229 lb (103.9 kg)   SpO2 98%   BMI 38.11 kg/m    Subjective:    Patient ID: Kellie Phelps, female    DOB: 03/01/1946, 78 y.o.   MRN: [id number]  HPI: Kellie Phelps is a 78 y.o. female  Chief Complaint  Patient presents with   Medication Refill   Sore Throat    Pt c/o sore throat, cough, congestion x1 week.    Discussed the use of AI scribe software for clinical note transcription with the patient, who gave verbal consent to proceed.  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 Interim medical history since our last visit reviewed. Allergies and medications reviewed and updated.  Review of Systems  Ten systems reviewed and is negative except as mentioned in HPI      Objective:      BP 130/70   Pulse 71   Temp 98.2 F (36.8 C)   Ht 5' 5 (1.651 m)   Wt 229 lb (103.9 kg)   SpO2 98%   BMI 38.11  kg/m    Wt Readings from Last 3 Encounters:  08/07/24 229 lb (103.9 kg)  06/22/24 227 lb 8 oz (103.2 kg)  05/08/24 224 lb 14.4 oz (102 kg)    Physical Exam GENERAL: Alert, cooperative, well developed, no acute distress. Lips and nose are swollen. HEENT: Normocephalic, normal oropharynx, moist mucous membranes. Fluid behind ear. lymphadenopathy CHEST: Mild wheezing. No rhonchi or crackles. CARDIOVASCULAR: Normal heart rate and rhythm, S1 and S2 normal without murmurs. ABDOMEN: Tender on palpation. Non-distended, without organomegaly, normal bowel sounds. EXTREMITIES: No cyanosis or edema. NEUROLOGICAL: Cranial nerves grossly intact, moves all extremities without gross motor or sensory deficit.  Results for orders placed or performed in visit on 06/22/24  POCT HgB A1C   Collection Time: 06/22/24  8:30 AM  Result Value Ref Range   Hemoglobin A1C 5.7 (A) 4.0 - 5.6 %   HbA1c POC (<> result, manual entry)     HbA1c, POC (prediabetic range)     HbA1c, POC (controlled diabetic range)            Assessment & Plan:   Problem List Items Addressed This Visit       Respiratory   Asthma-COPD overlap syndrome (HCC)  Relevant Medications   fluticasone  (FLONASE ) 50 MCG/ACT nasal spray   cetirizine  (ZYRTEC ) 10 MG tablet   benzonatate  (TESSALON ) 100 MG capsule   predniSONE  (DELTASONE ) 20 MG tablet   COPD exacerbation (HCC) - Primary   Relevant Medications   fluticasone  (FLONASE ) 50 MCG/ACT nasal spray   cetirizine  (ZYRTEC ) 10 MG tablet   benzonatate  (TESSALON ) 100 MG capsule   predniSONE  (DELTASONE ) 20 MG tablet     Other   Seasonal allergies   Other Visit Diagnoses       Viral upper respiratory tract infection       Relevant Medications   fluticasone  (FLONASE ) 50 MCG/ACT nasal spray   cetirizine  (ZYRTEC ) 10 MG tablet   benzonatate  (TESSALON ) 100 MG capsule   predniSONE  (DELTASONE ) 20 MG tablet        Assessment and Plan Assessment & Plan COPD with acute exacerbation  and asthma-COPD overlap syndrome COPD exacerbation with mild wheezing and nonproductive cough. No shortness of breath or fever. Continues to use albuterol  inhaler and nebulizer, Advair, and Flonase . - Continue albuterol  inhaler and nebulizer - Continue Advair - Prescribed Flonase  - Prescribed steroids  Acute upper respiratory infection Sore throat, cough, and congestion for one week. No fever. Fluid behind the ear. Tenderness in the throat and swelling of lips and nose. - Prescribed Zyrtec  - Advised taking plain Mucinex  - Prescribed cough medicine  Seasonal allergic rhinitis Nasal congestion and rhinorrhea. Currently using Flonase . - Prescribed Flonase  - Prescribed Zyrtec   Recommend taking zyrtec , flonase , mucinex , vitamin d, vitamin c, and zinc. Push fluids and get rest.         Follow up plan: Return if symptoms worsen or fail to improve.

## 2024-08-08 NOTE — Telephone Encounter (Signed)
 Duplicate request, refilled 08/07/24.  Requested Prescriptions  Pending Prescriptions Disp Refills   fluticasone  (FLONASE ) 50 MCG/ACT nasal spray 16 g 6    Sig: Place 2 sprays into both nostrils daily.     Ear, Nose, and Throat: Nasal Preparations - Corticosteroids Passed - 08/08/2024  9:21 AM      Passed - Valid encounter within last 12 months    Recent Outpatient Visits           Yesterday COPD exacerbation [facility])   [facility] Gareth Mliss FALCON, FNP   1 month ago Essential hypertension   [facility] Bernardo Fend, DO   3 months ago Sinus pressure   [facility] Bernardo Fend, DO   7 months ago Seasonal allergies   [facility] Bernardo Fend, [place]

## 2024-08-12 ENCOUNTER — Other Ambulatory Visit: Payer: Self-pay | Admitting: Emergency Medicine

## 2024-08-18 ENCOUNTER — Telehealth: Payer: Self-pay | Admitting: Internal Medicine

## 2024-08-18 ENCOUNTER — Telehealth: Payer: Self-pay

## 2024-08-18 DIAGNOSIS — J4489 Other specified chronic obstructive pulmonary disease: Secondary | ICD-10-CM

## 2024-08-18 DIAGNOSIS — J441 Chronic obstructive pulmonary disease with (acute) exacerbation: Secondary | ICD-10-CM

## 2024-08-18 DIAGNOSIS — J069 Acute upper respiratory infection, unspecified: Secondary | ICD-10-CM

## 2024-08-18 NOTE — Telephone Encounter (Signed)
albuterol (VENTOLIN HFA) 108 (90 Base) MCG/ACT inhaler ?

## 2024-08-18 NOTE — Telephone Encounter (Signed)
 Copied from CRM #[id number]. Topic: Clinical - Prescription Issue >> Aug 18, 2024  2:17 PM Larissa S wrote: Reason for RMF:Ejupzwu states prescription for fluticasone  (FLONASE ) 50 MCG/ACT nasal spray needs to be sent to the pharmacy listed below. She states this medication was previously sent to the wrong pharmacy and needs the prescription resent so that she is able to get her medication. She is requesting a call back once completed.   [facility] [place], [place] - [address] [place] [address] Phone: [phone] Fax: [phone] Hours: Not open 24 hours

## 2024-08-19 MED ORDER — ALBUTEROL SULFATE HFA 108 (90 BASE) MCG/ACT IN AERS: 2.0000 | INHALATION_SPRAY | Freq: Four times a day (QID) | RESPIRATORY_TRACT | 0 refills | Status: DC | PRN

## 2024-09-04 ENCOUNTER — Other Ambulatory Visit: Payer: Self-pay | Admitting: Internal Medicine

## 2024-09-04 DIAGNOSIS — Z1231 Encounter for screening mammogram for malignant neoplasm of breast: Secondary | ICD-10-CM

## 2024-09-05 ENCOUNTER — Other Ambulatory Visit: Payer: Self-pay | Admitting: Internal Medicine

## 2024-09-05 DIAGNOSIS — J441 Chronic obstructive pulmonary disease with (acute) exacerbation: Secondary | ICD-10-CM

## 2024-09-09 NOTE — Telephone Encounter (Signed)
 Requested Prescriptions  Pending Prescriptions Disp Refills   albuterol  (VENTOLIN  HFA) 108 (90 Base) MCG/ACT inhaler [Pharmacy Med Name: albuterol  sulfate HFA 90 mcg/actuation aerosol inhaler] 18 g 0    Sig: INHALE TWO PUFFS INTO THE LUNGS EVERY SIX HOURS AS NEEDED FOR WHEEZING OR SHORTNESS OF BREATH.     Pulmonology:  Beta Agonists 2 Passed - 09/09/2024  9:10 AM      Passed - Last BP in normal range    BP Readings from Last 1 Encounters:  08/07/24 130/70         Passed - Last Heart Rate in normal range    Pulse Readings from Last 1 Encounters:  08/07/24 71         Passed - Valid encounter within last 12 months    Recent Outpatient Visits           1 month ago COPD exacerbation [facility])   [facility] Gareth Mliss FALCON, FNP   2 months ago Essential hypertension   [facility] Bernardo Fend, DO   4 months ago Sinus pressure   [facility] Bernardo Fend, DO   8 months ago Seasonal allergies   [place] [place] Bernardo Fend, [place]

## 2024-09-14 ENCOUNTER — Ambulatory Visit: Admitting: Pulmonary Disease

## 2024-09-18 ENCOUNTER — Ambulatory Visit: Payer: Self-pay | Admitting: Adult Health Nurse Practitioner

## 2024-09-18 NOTE — Telephone Encounter (Signed)
 FYI Only or Action Required?: Action required by provider: medication refill request.  Declines appt in next 24 hours as she is feeling weak. Would like to have something sent to her pharmacy for cough. Preferred pharmacy below:  [facility] [address]), [place] - [address]   Patient was last seen in primary care on 08/07/2024 by Gareth Mliss FALCON, FNP.  Called Nurse Triage reporting Nasal Congestion and Cough.  Symptoms began several days ago.  Interventions attempted: OTC medications: Tylenol .  Symptoms are: gradually worsening.  Triage Disposition: See Physician Within 24 Hours  Patient/caregiver understands and will follow disposition?: No, wishes to speak with PCP     Runny nose, headache, chest congestion, dry cough and weakness over the weekend. No CP or SOB or fever. Has not taken home flu or covid test. Declines appt in next 24 hours as she is feeling weak. Would like to have something sent to her pharmacy. Forwarding request to office.  Advised UC or ED for worsening symptoms.       Message from [place] E sent at 09/18/2024  2:11 PM EST  Summary: Potential flu symptoms, wants to speak to a nurse   Reason for Triage: Pt wants to speak to a nurse regarding her potential flu symptoms, she declined offer for appt. Says she is too weak to come in.  Best contact: [phone]         Reason for Disposition  [1] Continuous (nonstop) coughing interferes with work or school AND [2] no improvement using cough treatment per Care Advice  Answer Assessment - Initial Assessment Questions 1. ONSET: When did the cough begin?      Over the weekend  2. SEVERITY: How bad is the cough today?      Moderate  3. SPUTUM: Describe the color of your sputum (e.g., none, dry cough; clear, white, yellow, green)     Dry  4. HEMOPTYSIS: Are you coughing up any blood? If Yes, ask: How much? (e.g., flecks, streaks, tablespoons, etc.)     Denies  5.  DIFFICULTY BREATHING: Are you having difficulty breathing? If Yes, ask: How bad is it? (e.g., mild, moderate, severe)      Denies  6. FEVER: Do you have a fever? If Yes, ask: What is your temperature, how was it measured, and when did it start?     Denies  7. CARDIAC HISTORY: Do you have any history of heart disease? (e.g., heart attack, congestive heart failure)      Denies  8. LUNG HISTORY: Do you have any history of lung disease?  (e.g., pulmonary embolus, asthma, emphysema)     Asthma and COPD and bronchitis  9. PE RISK FACTORS: Do you have a history of blood clots? (or: recent major surgery, recent prolonged travel, bedridden)     Denies  10. OTHER SYMPTOMS: Do you have any other symptoms? (e.g., runny nose, wheezing, chest pain)       Runny nose, headache, chest congestion, dry cough and weakness  Protocols used: Cough - Acute Non-Productive-A-AH

## 2024-09-23 ENCOUNTER — Ambulatory Visit: Admitting: Internal Medicine

## 2024-09-23 ENCOUNTER — Other Ambulatory Visit: Payer: Self-pay

## 2024-09-23 ENCOUNTER — Encounter: Payer: Self-pay | Admitting: Internal Medicine

## 2024-09-23 VITALS — BP 128/76 | HR 70 | Temp 98.3°F | Resp 16 | Ht 65.0 in | Wt 230.5 lb

## 2024-09-23 DIAGNOSIS — R7303 Prediabetes: Secondary | ICD-10-CM

## 2024-09-23 DIAGNOSIS — I10 Essential (primary) hypertension: Secondary | ICD-10-CM

## 2024-09-23 DIAGNOSIS — E782 Mixed hyperlipidemia: Secondary | ICD-10-CM

## 2024-09-23 DIAGNOSIS — J441 Chronic obstructive pulmonary disease with (acute) exacerbation: Secondary | ICD-10-CM

## 2024-09-23 DIAGNOSIS — R051 Acute cough: Secondary | ICD-10-CM

## 2024-09-23 LAB — POC COVID19/FLU A&B COMBO
Covid Antigen, POC: NEGATIVE
Influenza A Antigen, POC: NEGATIVE
Influenza B Antigen, POC: NEGATIVE

## 2024-09-23 MED ORDER — PREDNISONE 20 MG PO TABS: 40.0000 mg | ORAL_TABLET | Freq: Every day | ORAL | 0 refills | Status: AC

## 2024-09-23 MED ORDER — DOXYCYCLINE HYCLATE 100 MG PO TABS: 100.0000 mg | ORAL_TABLET | Freq: Two times a day (BID) | ORAL | 0 refills | Status: AC

## 2024-09-23 NOTE — Progress Notes (Signed)
 "  Acute Office Visit  Subjective:     Patient ID: Kellie Phelps, female    DOB: 08-13-1946, 79 y.o.   MRN: [id number]  Chief Complaint  Patient presents with   Cough    Headache, runny nose, congestion for 1 week    Cough Pertinent negatives include no chills, ear pain, fever, sore throat, shortness of breath or wheezing.   Patient is in today for URI symptoms.   Discussed the use of AI scribe software for clinical note transcription with the patient, who gave verbal consent to proceed.  History of Present Illness Kellie Phelps is an 79 year old female with COPD who presents with exacerbation of symptoms.  She has had a dry, wheezy cough for about a week without sputum and feels short of breath, not clearly exertional. She has not had fevers. She uses Advair daily and albuterol  as needed as prescribed.  She has nasal congestion, rhinorrhea with clear thin discharge, and nasal swelling.  She is prediabetic with early neuropathic symptoms in her feet, which may affect ongoing management, but there are no acute glycemic concerns today.  Hypertension/Diastolic HF: -Medications: Amlodipine  5 mg, Metoprolol  25 mg, Nitroglycerin  PRN -Patient is compliant with above medications and reports no side effects. -Checking BP at home (average): 120-130/60-70, stable  -Denies any SOB, CP, vision changes, LE edema or symptoms of hypotension -Follows with cardiology in [place], had a stress test on 10/13/21, EF 62% with small reversible defect of the anteroseptal wall in the apical and mid segments but otherwise normal.  -Last echo 05/12/2019 showing EF 60-65% with left ventricular diastolic impaired relaxation.    HLD/CAD: -Medications: Zocor  40 mg, aspirin  81 mg, fish oil  -Patient is compliant with above medications and reports no side effects.  -Last lipid panel: Lipid Panel     Component Value Date/Time   CHOL 139 12/16/2023 0833   CHOL 155 03/31/2015 1439    TRIG 98 12/16/2023 0833   HDL 51 12/16/2023 0833   HDL 45 03/31/2015 1439   CHOLHDL 2.7 12/16/2023 0833   VLDL 20 01/16/2023 0441   LDLCALC 70 12/16/2023 0833   LABVLDL 43 (H) 03/31/2015 1439   Pre-Diabetes: -Last A1c 5.7% 10/25   Review of Systems  Constitutional:  Positive for malaise/fatigue. Negative for chills and fever.  HENT:  Positive for congestion. Negative for ear pain, sinus pain and sore throat.   Respiratory:  Positive for cough. Negative for sputum production, shortness of breath and wheezing.         Objective:    BP 128/76 (Cuff Size: Large)   Pulse 70   Temp 98.3 F (36.8 C) (Oral)   Resp 16   Ht 5' 5 (1.651 m)   Wt 230 lb 8 oz (104.6 kg)   SpO2 93%   BMI 38.36 kg/m  BP Readings from Last 3 Encounters:  09/23/24 128/76  08/07/24 130/70  06/22/24 130/82   Wt Readings from Last 3 Encounters:  09/23/24 230 lb 8 oz (104.6 kg)  08/07/24 229 lb (103.9 kg)  06/22/24 227 lb 8 oz (103.2 kg)      Physical Exam Constitutional:      Appearance: Normal appearance.  HENT:     Head: Normocephalic and atraumatic.     Right Ear: Tympanic membrane, ear canal and external ear normal.     Left Ear: Tympanic membrane, ear canal and external ear normal.     Nose: Congestion present.     Mouth/Throat:  Mouth: Mucous membranes are moist.     Pharynx: Oropharynx is clear.  Eyes:     Conjunctiva/sclera: Conjunctivae normal.  Cardiovascular:     Rate and Rhythm: Normal rate and regular rhythm.  Pulmonary:     Effort: Pulmonary effort is normal.     Breath sounds: No rhonchi.     Comments: Decreased air sounds throughout  Skin:    General: Skin is warm and dry.  Neurological:     General: No focal deficit present.     Mental Status: She is alert. Mental status is at baseline.  Psychiatric:        Mood and Affect: Mood normal.        Behavior: Behavior normal.     Results for orders placed or performed in visit on 09/23/24  POC Covid19/Flu A&B  Antigen  Result Value Ref Range   Influenza A Antigen, POC Negative Negative   Influenza B Antigen, POC Negative Negative   Covid Antigen, POC Negative Negative        Assessment & Plan:   Assessment & Plan COPD exacerbation Likely viral etiology with dry, wheezy cough and nasal congestion. Lung exam benign with decreased air movement. Flu and COVID tests negative. Discussed pneumonia risk due to COPD exacerbation. - Prescribed prednisone  40 mg daily for 5 days. - Prescribed doxycycline . - Continue Advair daily. - Use albuterol  as needed. - Use nasal saline and nasal steroids for congestion. - Avoid medications containing Sudafed due to hypertension.  Essential hypertension Discussed hypertension management and avoidance of medications that may elevate blood pressure. - Avoid medications containing Sudafed. - Labs today.   Hyperlipidemia - Check lipid panel today.   Prediabetes Previously identified prediabetes with last HbA1c check in October. - Will recheck HbA1c today.  General health maintenance Discussed pneumonia vaccine due to frequent illnesses and risk of pneumonia. Mammogram scheduled for Friday. - Consider pneumonia vaccine in the future. - Proceed with scheduled mammogram.   - POC Covid19/Flu A&B Antigen - predniSONE  (DELTASONE ) 20 MG tablet; Take 2 tablets (40 mg total) by mouth daily with breakfast for 5 days.  Dispense: 10 tablet; Refill: 0 - doxycycline  (VIBRA -TABS) 100 MG tablet; Take 1 tablet (100 mg total) by mouth 2 (two) times daily for 7 days.  Dispense: 14 tablet; Refill: 0 - CBC w/Diff/Platelet - Comprehensive Metabolic Panel (CMET) - Lipid Profile - HgB A1c   Return in about 3 months (around 12/22/2024) for follow up on a1c.  Kellie Fischer, DO   "

## 2024-09-24 ENCOUNTER — Ambulatory Visit: Payer: Self-pay | Admitting: Internal Medicine

## 2024-09-24 LAB — CBC WITH DIFFERENTIAL/PLATELET
Absolute Lymphocytes: 2278 {cells}/uL (ref 850–3900)
Absolute Monocytes: 403 {cells}/uL (ref 200–950)
Basophils Absolute: 83 {cells}/uL (ref 0–200)
Basophils Relative: 1.3 %
Eosinophils Absolute: 122 {cells}/uL (ref 15–500)
Eosinophils Relative: 1.9 %
HCT: 45.2 % (ref 35.9–46.0)
Hemoglobin: 14.6 g/dL (ref 11.7–15.5)
MCH: 28.7 pg (ref 27.0–33.0)
MCHC: 32.3 g/dL (ref 31.6–35.4)
MCV: 89 fL (ref 81.4–101.7)
MPV: 9.8 fL (ref 7.5–12.5)
Monocytes Relative: 6.3 %
Neutro Abs: 3514 {cells}/uL (ref 1500–7800)
Neutrophils Relative %: 54.9 %
Platelets: 243 Thousand/uL (ref 140–400)
RBC: 5.08 Million/uL (ref 3.80–5.10)
RDW: 13 % (ref 11.0–15.0)
Total Lymphocyte: 35.6 %
WBC: 6.4 Thousand/uL (ref 3.8–10.8)

## 2024-09-24 LAB — COMPREHENSIVE METABOLIC PANEL WITH GFR
AG Ratio: 1.8 (calc) (ref 1.0–2.5)
ALT: 39 U/L — ABNORMAL HIGH (ref 6–29)
AST: 39 U/L — ABNORMAL HIGH (ref 10–35)
Albumin: 4.2 g/dL (ref 3.6–5.1)
Alkaline phosphatase (APISO): 81 U/L (ref 37–153)
BUN: 10 mg/dL (ref 7–25)
CO2: 30 mmol/L (ref 20–32)
Calcium: 9.2 mg/dL (ref 8.6–10.4)
Chloride: 105 mmol/L (ref 98–110)
Creat: 0.91 mg/dL (ref 0.60–0.95)
Globulin: 2.4 g/dL (ref 1.9–3.7)
Glucose, Bld: 106 mg/dL — ABNORMAL HIGH (ref 65–99)
Potassium: 4.5 mmol/L (ref 3.5–5.3)
Sodium: 142 mmol/L (ref 135–146)
Total Bilirubin: 0.5 mg/dL (ref 0.2–1.2)
Total Protein: 6.6 g/dL (ref 6.1–8.1)
eGFR: 64 mL/min/1.73m2

## 2024-09-24 LAB — HEMOGLOBIN A1C
Hgb A1c MFr Bld: 5.8 % — ABNORMAL HIGH
Mean Plasma Glucose: 120 mg/dL
eAG (mmol/L): 6.6 mmol/L

## 2024-09-24 LAB — LIPID PANEL
Cholesterol: 132 mg/dL
HDL: 53 mg/dL
LDL Cholesterol (Calc): 61 mg/dL
Non-HDL Cholesterol (Calc): 79 mg/dL
Total CHOL/HDL Ratio: 2.5 (calc)
Triglycerides: 95 mg/dL

## 2024-09-25 ENCOUNTER — Ambulatory Visit: Admitting: Pulmonary Disease

## 2024-09-25 ENCOUNTER — Encounter: Payer: Self-pay | Admitting: Pulmonary Disease

## 2024-09-25 VITALS — BP 120/76 | HR 64 | Temp 97.9°F | Ht 65.0 in | Wt 230.0 lb

## 2024-09-25 DIAGNOSIS — J4489 Other specified chronic obstructive pulmonary disease: Secondary | ICD-10-CM | POA: Diagnosis not present

## 2024-09-25 DIAGNOSIS — Z87891 Personal history of nicotine dependence: Secondary | ICD-10-CM | POA: Diagnosis not present

## 2024-09-25 DIAGNOSIS — J209 Acute bronchitis, unspecified: Secondary | ICD-10-CM

## 2024-09-25 MED ORDER — FLUTICASONE-SALMETEROL 250-50 MCG/ACT IN AEPB: 1.0000 | INHALATION_SPRAY | Freq: Two times a day (BID) | RESPIRATORY_TRACT | 3 refills | Status: AC

## 2024-09-25 NOTE — Progress Notes (Signed)
 "  Subjective:    Patient ID: Kellie Phelps, female    DOB: May 04, 1946, 79 y.o.   MRN: [id number]  Patient Care Team: Bernardo Fend, DO as PCP - General (Internal Medicine) Perla Evalene PARAS, MD as Consulting Physician (Cardiology) Ermalinda Lenn HERO, [place] as Social Worker  Chief Complaint  Patient presents with   COPD    Patient using Advair daily. Patient did not rolerate Trelegy, reports medication caused her to feel dizzy and sick. Patient was seen yesterday by PCP for cold symptoms. Has not picked up doxy and prednisone . Patient reports runny nose and headache.     BACKGROUND: 79 year old female, former smoker. Past medical history significant for hypertension, coronary artery disease, COPD/asthma, benign paroxysmal positional vertigo, seasonal allergies, atypical chest pain, diastolic dysfunction, hyperlipidemia.  Previous patient of Dr. Alm Nett, last seen by me 27 August 2023 here for follow-up on COPD/asthma overlap.    HPI Discussed the use of AI scribe software for clinical note transcription with the patient, who gave verbal consent to proceed.  History of Present Illness   Kellie Phelps is an 79 year old female with COPD and asthma overlap who presents with medication side effects and persistent cough.  She has not been seen since December 2024 at that time she was given a trial of Trelegy however she had side effects from the medication, including dizziness and nausea, which prompted her to discontinue the medication and start back on Advair 250/50 which she tolerates.  She was seen yesterday for congestion and was prescribed prednisone  and doxycycline  by her primary care physician. She has a persistent, non-productive cough that is particularly bothersome at night, affecting her sleep. She was given cough medicine but is unsure if this will be effective, she needs to get the medications prescribed yesterday as she has not gone to the pharmacy  to get them.   She does not endorse any fevers, chills or sweats.  No chest pain.  No lower extremity edema.  No calf tenderness.  She had a negative COVID test yesterday.    She does not appear acutely ill today and is fully ambulatory.  DATA: 08/21/2018 PFTs: FVC: 2.50 > 2.97 L (106 > 125 %pred), FEV1: 1.45 > 1.73 L (79 > 94 %pred), FEV1/FVC: 58%, TLC: 5.71 L (112 %pred), DLCO 73% pred.  Flow volume curve consistent with mild obstruction.  19% improvement in FEV1 after bronchodilator therapy. 08/27/2023 PFTs: FEV1 1.45 L or 72% predicted, FVC 2.24 L or 83% predicted, FEV1/FVC 65%, no significant bronchodilator response, lung volumes normal, moderate diffusion impairment.  Compared to prior study from 08/21/2018 there has been no significant change in FEV1.  Review of Systems A 10 point review of systems was performed and it is as noted above otherwise negative.   Past Medical History:  Diagnosis Date   Allergy    Anxiety    Arthritis    Asthma    Atypical chest pain    Cataract    COPD (chronic obstructive pulmonary disease) (HCC)    Coronary artery disease, non-occlusive    a. LHC 6/08 EF 55-60%, 20-30% proximal LAD, LIs CFX, LIs RCA (Harwani); b. ETT-myoview  11/10, 3' stopped due to fatigue and chest tightness, EF normal, probably normal perfusion images with minimal soft tissue attenuation   Diastolic dysfunction    a. echo 2010: EF 60-65%, GR1DD, no AI, trivial MR, LA nl   GERD (gastroesophageal reflux disease)    Glaucoma    Hyperglycemia  Hyperlipidemia    Hypertension     Past Surgical History:  Procedure Laterality Date   ESOPHAGOGASTRODUODENOSCOPY (EGD) WITH PROPOFOL  N/A 05/25/2022   Procedure: ESOPHAGOGASTRODUODENOSCOPY (EGD) WITH PROPOFOL ;  Surgeon: Unk Corinn Skiff, MD;  Location: [facility] ENDOSCOPY;  Service: Gastroenterology;  Laterality: N/A;  RIDE IS ABOUT 20 MINUTES OUT   TONSILLECTOMY      Patient Active Problem List   Diagnosis Date Noted   COPD  exacerbation (HCC) 08/07/2024   Bruising 12/16/2023   Prediabetes 12/16/2023   Closed fracture of head of left radius 03/18/2023   BPPV (benign paroxysmal positional vertigo) 01/17/2023   Weakness 01/15/2023   Mild intermittent asthma without complication 01/18/2022   Gastroesophageal reflux disease 01/18/2022   Seasonal allergies 01/18/2022   Anxiety 01/18/2022   Coronary artery disease, non-occlusive    Diastolic dysfunction    Atypical chest pain    Hyperglycemia    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/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   Hyperlipidemia 07/15/2009   Essential hypertension 07/15/2009   SHORTNESS OF BREATH 07/15/2009    Family History  Problem Relation Age of Onset   Uterine cancer Mother    Heart attack Father    Heart disease Father    Pulmonary embolism Brother    Asthma Brother    Hepatitis Brother    Coronary artery disease Brother    Coronary artery disease Other        Aunt    Social History   Tobacco Use   Smoking status: Former    Current packs/day: 0.00    Average packs/day: 0.5 packs/day for 5.0 years (2.5 ttl pk-yrs)    Types: Cigarettes    Start date: 09/17/1973    Quit date: 09/17/1978    Years since quitting: 46.0   Smokeless tobacco: Never   Tobacco comments:    unsure how long she smoked for--10/27/2020  Substance Use Topics   Alcohol use: No    Allergies[1]  Active Medications[2]   There is no immunization history on file for this patient.      Objective:     Vitals:   09/25/24 1105  BP: 120/76  Pulse: 64  Temp: 97.9 F (36.6 C)  Height: 5' 5 (1.651 m)  Weight: 230 lb (104.3 kg)  SpO2: 94%  TempSrc: Temporal  BMI (Calculated): 38.27     SpO2: 94 %  GENERAL: Obese woman, no acute distress, fully ambulatory, no conversational dyspnea HEAD: Normocephalic, atraumatic.  EYES: Pupils equal, round, reactive to light.  No scleral icterus.  MOUTH: Nose/mouth/throat not examined due to institutional masking requirements.   NECK: Supple. No  thyromegaly. Trachea midline. No JVD.  No adenopathy. PULMONARY: Good air entry bilaterally.  Coarse, otherwise, no adventitious sounds. CARDIOVASCULAR: S1 and S2. Regular rate and rhythm.  No rubs, murmurs or gallops heard. ABDOMEN: Obese, benign. MUSCULOSKELETAL: No joint deformity, no clubbing, no edema.  NEUROLOGIC: No focal deficit, no gait disturbance, speech is fluent. SKIN: Intact,warm,dry. PSYCH: Mood and behavior normal        Assessment & Plan:     ICD-10-CM   1. Asthma-COPD overlap syndrome (HCC)  J44.89 Pulmonary function test    2. Acute bronchitis, unspecified organism  J20.9       Orders Placed This Encounter  Procedures   Pulmonary function test    Standing Status:   Future    Expiration Date:   09/25/2025    Where should this test be performed?:   Outpatient Pulmonary    What type of PFT is being ordered?:  Full PFT    Meds ordered this encounter  Medications   fluticasone -salmeterol (ADVAIR) 250-50 MCG/ACT AEPB    Sig: Inhale 1 puff into the lungs in the morning and at bedtime.    Dispense:  180 each    Refill:  3   Discussion:    Asthma-COPD overlap syndrome Recent exacerbation with congestion and cough. Previously on Advair, which caused dizziness and nausea, leading to discontinuation. Currently on prednisone  and doxycycline , expected to alleviate symptoms. Breathing tests are due on follow-up. - Scheduled follow-up appointment in four months with PFTs.  Acute bronchitis Recent diagnosis with symptoms of cough and congestion. Currently on prednisone  and doxycycline , expected to improve symptoms. Cough is bothersome, affecting sleep. - Continue prednisone  and doxycycline  as prescribed.      Advised if symptoms do not improve or worsen, to please contact office for sooner follow up or seek emergency care.    I spent 35 minutes of dedicated to the care of this patient on the date of this encounter to include pre-visit review of records,  face-to-face time with the patient discussing conditions above, post visit ordering of testing, clinical documentation with the electronic health record, making appropriate referrals as documented, and communicating necessary findings to members of the patients care team.   C. Leita Sanders, MD Advanced Bronchoscopy PCCM [facility]    *This note was dictated using voice recognition software/Dragon.  Despite best efforts to proofread, errors can occur which can change the meaning. Any transcriptional errors that result from this process are unintentional and may not be fully corrected at the time of dictation.     [1]  Allergies Allergen Reactions   Penicillins Hives and Other (See Comments)  [2]  Current Meds  Medication Sig   albuterol  (PROVENTIL ) (2.5 MG/3ML) 0.083% nebulizer solution Take 3 mLs (2.5 mg total) by nebulization every 6 (six) hours as needed for wheezing or shortness of breath.   albuterol  (VENTOLIN  HFA) 108 (90 Base) MCG/ACT inhaler INHALE TWO PUFFS INTO THE LUNGS EVERY SIX HOURS AS NEEDED FOR WHEEZING OR SHORTNESS OF BREATH.   amLODipine  (NORVASC ) 5 MG tablet Take 1 tablet (5 mg total) by mouth daily.   Calcium  Carbonate-Vitamin D 600-200 MG-UNIT TABS Take by mouth.   cetirizine  (ZYRTEC ) 10 MG tablet Take 1 tablet (10 mg total) by mouth daily.   dorzolamide  (TRUSOPT ) 2 % ophthalmic solution Place 1 drop into both eyes 2 (two) times daily.   escitalopram  (LEXAPRO ) 10 MG tablet Take 1 tablet (10 mg total) by mouth daily.   fluticasone  (FLONASE ) 50 MCG/ACT nasal spray Place 2 sprays into both nostrils daily.   metoprolol  succinate (TOPROL -XL) 25 MG 24 hr tablet Take 1 tablet (25 mg total) by mouth daily.   Multiple Vitamins-Minerals (ONE-A-DAY WOMENS 50 PLUS PO) Take by mouth daily.   nitroGLYCERIN  (NITROSTAT ) 0.4 MG SL tablet Place 1 tablet (0.4 mg total) under the tongue every 5 (five) minutes as needed for chest pain.   nystatin  (MYCOSTATIN /NYSTOP )  powder Apply 1 Application topically 3 (three) times daily.   Omega-3 Fatty Acids (FISH OIL) 1000 MG CAPS 1,000 mg. Take two tablets by mouth daily.   pantoprazole  (PROTONIX ) 40 MG tablet Take 1 tablet (40 mg total) by mouth daily.   simvastatin  (ZOCOR ) 40 MG tablet Take 1 tablet (40 mg total) by mouth daily.   triamcinolone (KENALOG) 0.025 % cream Apply 1 Application topically 2 (two) times daily.   VYZULTA 0.024 % SOLN Place 1 drop into both eyes every evening.   [  DISCONTINUED] fluticasone -salmeterol (ADVAIR) 250-50 MCG/ACT AEPB Inhale 1 puff into the lungs in the morning and at bedtime.   "

## 2024-09-25 NOTE — Patient Instructions (Addendum)
 We proceeded to refill your Advair and sent that to your pharmacy.  Please take the prednisone  and doxycycline  that was prescribed by Dr. Bernardo yesterday.  You have a mild bronchitis.  We will see him in follow-up in 4 months time we have ordered breathing test that will be done around that time as well.

## 2024-10-05 ENCOUNTER — Other Ambulatory Visit (HOSPITAL_COMMUNITY): Payer: Self-pay

## 2024-10-06 ENCOUNTER — Encounter

## 2024-10-14 ENCOUNTER — Other Ambulatory Visit: Payer: Self-pay | Admitting: Emergency Medicine

## 2024-10-14 DIAGNOSIS — J441 Chronic obstructive pulmonary disease with (acute) exacerbation: Secondary | ICD-10-CM

## 2024-10-14 MED ORDER — ALBUTEROL SULFATE HFA 108 (90 BASE) MCG/ACT IN AERS: 2.0000 | INHALATION_SPRAY | Freq: Four times a day (QID) | RESPIRATORY_TRACT | 0 refills | Status: AC | PRN

## 2024-12-28 ENCOUNTER — Ambulatory Visit: Admitting: Internal Medicine

## 2025-01-25 ENCOUNTER — Ambulatory Visit: Admitting: Pulmonary Disease

## 2025-01-25 ENCOUNTER — Encounter
